# Patient Record
Sex: Female | Born: 1960
Health system: Southern US, Community
[De-identification: ages and names within clinical notes are randomized; demographics above are authoritative.]

## PROBLEM LIST (undated history)

## (undated) DIAGNOSIS — R112 Nausea with vomiting, unspecified: Secondary | ICD-10-CM

## (undated) DIAGNOSIS — Z9889 Other specified postprocedural states: Secondary | ICD-10-CM

## (undated) DIAGNOSIS — T7840XA Allergy, unspecified, initial encounter: Secondary | ICD-10-CM

## (undated) DIAGNOSIS — C50919 Malignant neoplasm of unspecified site of unspecified female breast: Secondary | ICD-10-CM

## (undated) DIAGNOSIS — K219 Gastro-esophageal reflux disease without esophagitis: Secondary | ICD-10-CM

## (undated) DIAGNOSIS — D242 Benign neoplasm of left breast: Secondary | ICD-10-CM

## (undated) DIAGNOSIS — D649 Anemia, unspecified: Secondary | ICD-10-CM

## (undated) DIAGNOSIS — Z923 Personal history of irradiation: Secondary | ICD-10-CM

## (undated) DIAGNOSIS — F419 Anxiety disorder, unspecified: Secondary | ICD-10-CM

## (undated) HISTORY — DX: Allergy, unspecified, initial encounter: T78.40XA

## (undated) HISTORY — PX: BREAST EXCISIONAL BIOPSY: SUR124

## (undated) HISTORY — PX: NASAL SEPTUM SURGERY: SHX37

## (undated) HISTORY — PX: KNEE ARTHROSCOPY: SHX127

---

## 1998-12-30 HISTORY — PX: OTHER SURGICAL HISTORY: SHX169

## 1999-06-18 ENCOUNTER — Other Ambulatory Visit: Admission: RE | Admit: 1999-06-18 | Discharge: 1999-06-18 | Payer: Self-pay | Admitting: Family Medicine

## 1999-12-31 HISTORY — PX: CHOLECYSTECTOMY: SHX55

## 2000-01-03 ENCOUNTER — Ambulatory Visit (HOSPITAL_COMMUNITY): Admission: RE | Admit: 2000-01-03 | Discharge: 2000-01-03 | Payer: Self-pay | Admitting: Orthopedic Surgery

## 2000-04-20 ENCOUNTER — Emergency Department (HOSPITAL_COMMUNITY): Admission: EM | Admit: 2000-04-20 | Discharge: 2000-04-20 | Payer: Self-pay

## 2000-06-18 ENCOUNTER — Other Ambulatory Visit: Admission: RE | Admit: 2000-06-18 | Discharge: 2000-06-18 | Payer: Self-pay | Admitting: Gynecology

## 2000-07-09 ENCOUNTER — Emergency Department (HOSPITAL_COMMUNITY): Admission: EM | Admit: 2000-07-09 | Discharge: 2000-07-10 | Payer: Self-pay

## 2000-07-10 ENCOUNTER — Encounter: Payer: Self-pay | Admitting: Emergency Medicine

## 2000-07-17 ENCOUNTER — Observation Stay (HOSPITAL_COMMUNITY): Admission: RE | Admit: 2000-07-17 | Discharge: 2000-07-18 | Payer: Self-pay | Admitting: General Surgery

## 2000-07-17 ENCOUNTER — Encounter: Payer: Self-pay | Admitting: General Surgery

## 2000-07-17 ENCOUNTER — Encounter (INDEPENDENT_AMBULATORY_CARE_PROVIDER_SITE_OTHER): Payer: Self-pay

## 2000-08-27 ENCOUNTER — Encounter: Payer: Self-pay | Admitting: Gynecology

## 2000-08-27 ENCOUNTER — Ambulatory Visit (HOSPITAL_COMMUNITY): Admission: RE | Admit: 2000-08-27 | Discharge: 2000-08-27 | Payer: Self-pay | Admitting: Gynecology

## 2000-12-11 ENCOUNTER — Encounter (INDEPENDENT_AMBULATORY_CARE_PROVIDER_SITE_OTHER): Payer: Self-pay | Admitting: Specialist

## 2000-12-11 ENCOUNTER — Other Ambulatory Visit: Admission: RE | Admit: 2000-12-11 | Discharge: 2000-12-11 | Payer: Self-pay | Admitting: *Deleted

## 2001-07-15 ENCOUNTER — Other Ambulatory Visit: Admission: RE | Admit: 2001-07-15 | Discharge: 2001-07-15 | Payer: Self-pay | Admitting: Gynecology

## 2002-07-20 ENCOUNTER — Other Ambulatory Visit: Admission: RE | Admit: 2002-07-20 | Discharge: 2002-07-20 | Payer: Self-pay | Admitting: Gynecology

## 2002-09-14 ENCOUNTER — Ambulatory Visit (HOSPITAL_COMMUNITY): Admission: RE | Admit: 2002-09-14 | Discharge: 2002-09-14 | Payer: Self-pay | Admitting: Gynecology

## 2002-09-14 ENCOUNTER — Encounter: Payer: Self-pay | Admitting: Gynecology

## 2003-07-18 ENCOUNTER — Other Ambulatory Visit: Admission: RE | Admit: 2003-07-18 | Discharge: 2003-07-18 | Payer: Self-pay | Admitting: Gynecology

## 2004-06-01 ENCOUNTER — Encounter: Admission: RE | Admit: 2004-06-01 | Discharge: 2004-06-01 | Payer: Self-pay | Admitting: Gynecology

## 2004-06-18 ENCOUNTER — Ambulatory Visit (HOSPITAL_COMMUNITY): Admission: RE | Admit: 2004-06-18 | Discharge: 2004-06-18 | Payer: Self-pay | Admitting: Family Medicine

## 2004-06-27 ENCOUNTER — Encounter: Admission: RE | Admit: 2004-06-27 | Discharge: 2004-06-27 | Payer: Self-pay | Admitting: Gynecology

## 2004-08-20 ENCOUNTER — Other Ambulatory Visit: Admission: RE | Admit: 2004-08-20 | Discharge: 2004-08-20 | Payer: Self-pay | Admitting: Gynecology

## 2005-07-26 ENCOUNTER — Encounter: Admission: RE | Admit: 2005-07-26 | Discharge: 2005-07-26 | Payer: Self-pay | Admitting: Gynecology

## 2005-09-03 ENCOUNTER — Other Ambulatory Visit: Admission: RE | Admit: 2005-09-03 | Discharge: 2005-09-03 | Payer: Self-pay | Admitting: Gynecology

## 2005-09-06 ENCOUNTER — Ambulatory Visit (HOSPITAL_COMMUNITY): Admission: RE | Admit: 2005-09-06 | Discharge: 2005-09-06 | Payer: Self-pay | Admitting: Gynecology

## 2006-02-21 ENCOUNTER — Encounter: Admission: RE | Admit: 2006-02-21 | Discharge: 2006-02-21 | Payer: Self-pay | Admitting: Family Medicine

## 2006-09-29 ENCOUNTER — Encounter: Admission: RE | Admit: 2006-09-29 | Discharge: 2006-09-29 | Payer: Self-pay | Admitting: Gynecology

## 2006-10-08 ENCOUNTER — Other Ambulatory Visit: Admission: RE | Admit: 2006-10-08 | Discharge: 2006-10-08 | Payer: Self-pay | Admitting: Gynecology

## 2007-02-09 ENCOUNTER — Encounter: Admission: RE | Admit: 2007-02-09 | Discharge: 2007-02-09 | Payer: Self-pay | Admitting: Family Medicine

## 2007-10-27 ENCOUNTER — Other Ambulatory Visit: Admission: RE | Admit: 2007-10-27 | Discharge: 2007-10-27 | Payer: Self-pay | Admitting: Gynecology

## 2007-12-16 ENCOUNTER — Ambulatory Visit (HOSPITAL_COMMUNITY): Admission: RE | Admit: 2007-12-16 | Discharge: 2007-12-16 | Payer: Self-pay | Admitting: Gynecology

## 2008-05-13 ENCOUNTER — Encounter: Admission: RE | Admit: 2008-05-13 | Discharge: 2008-05-13 | Payer: Self-pay | Admitting: Family Medicine

## 2008-10-12 ENCOUNTER — Other Ambulatory Visit: Admission: RE | Admit: 2008-10-12 | Discharge: 2008-10-12 | Payer: Self-pay | Admitting: Gynecology

## 2008-10-14 ENCOUNTER — Encounter: Admission: RE | Admit: 2008-10-14 | Discharge: 2008-10-14 | Payer: Self-pay | Admitting: Gynecology

## 2008-12-30 HISTORY — PX: BREAST SURGERY: SHX581

## 2009-08-15 ENCOUNTER — Encounter: Admission: RE | Admit: 2009-08-15 | Discharge: 2009-08-15 | Payer: Self-pay | Admitting: Gynecology

## 2010-02-19 ENCOUNTER — Encounter: Admission: RE | Admit: 2010-02-19 | Discharge: 2010-02-19 | Payer: Self-pay | Admitting: General Surgery

## 2010-08-24 ENCOUNTER — Encounter: Admission: RE | Admit: 2010-08-24 | Discharge: 2010-08-24 | Payer: Self-pay | Admitting: Gynecology

## 2010-11-10 ENCOUNTER — Encounter: Admission: RE | Admit: 2010-11-10 | Discharge: 2010-11-10 | Payer: Self-pay | Admitting: Gynecology

## 2011-05-08 ENCOUNTER — Encounter (INDEPENDENT_AMBULATORY_CARE_PROVIDER_SITE_OTHER): Payer: Self-pay | Admitting: General Surgery

## 2011-05-17 NOTE — Op Note (Signed)
[place]  Patient:    Cynthia Ford, Cynthia Ford                      MRN: [id number] Proc. Date: 07/17/00 Adm. Date:  [id number] Disc. Date: [id number] Attending:  Henrene Dodge                           Operative Report  PREOPERATIVE DIAGNOSIS:  Chronic cholecystitis.  POSTOPERATIVE DIAGNOSIS:  Chronic cholecystitis.  OPERATION:  Laparoscopic cholecystectomy with cholangiogram.  SURGEON:  Anselm Pancoast. Zachery Dakins, M.D.  ASSISTANT:  Currie Paris, M.D.  INDICATIONS:  Cynthia Ford is a 50 year old Caucasian female who presented to the emergency room here on July 09, 2000, with severe recurrent epigastric pain and this pain had started approximately at 11:00 p.m. the previous evening.  Her gallbladder showed sludge and her liver enzymes were abnormal with a bilirubin of 1.9, an SGOT of 287.  The patients pain started subsiding and she elected to basically schedule now for surgery one week later because of work commitments.  During this past week she has not had significant episodes of pain, but she has been on a low fat diet and cautious with her eating.  I did not repeat her liver function studies preoperatively.  PROCEDURE IN DETAIL:  The patient was taken to the operative suite.  She received 3 grams of Unasyn and SPA stockings.  The abdomen was prepped with Betadine and draped in a sterile manner.  A small incision was made below the umbilicus, fascia identified, picked up between two hemostats, a small opening made, the underlying peritoneum identified, this picked up, and a small opening made through this.  A traction suture was then placed holding up the abdominal wall, and then the Hasson cannula introduced.  The gallbladder was quite distended but not acutely inflamed.  The upper 10 mm trocar was placed under the midline after anesthetizing the fascia, and then the two lateral 5 mm trocars were placed.  The gallbladder was  grasped, retracted upward and outward.  There was a lot of thickening and scarring around the cystic duct, gallbladder junction, and we were not sure whether we could definitely see the common bile duct or the cystic duct the way it was lying.  We carefully teased off the cystic duct real flush with the gallbladder, clipped it, and then placed a catheter within the cystic duct and obtained an X-ray.  This shows a long dilated cystic duct, kind of tortuous, really entering from the left side.  What we were visualizing was the long cystic duct.  Then, there is kind of prompt filling into the duodenum.  When you put her head down, it re-flushes back up into the intrahepatic radicles where you can see both the left and right.  No evidence of any definite stones was seen.  There was good prompt filling. We then removed the catheter, triply clipped the cystic duct after taking out probably about another inch longer of it, and then identified the anterior-posterior branch of the cystic arteries, which were doubly clamped proximally and singly distally, and then the gallbladder freed from its bed with the hook electrocautery.  Good hemostasis was obtained.  The gallbladder was then brought up through the fascia of the umbilicus and then the fascia was closed with two figure-of-eight Vicryl.  The fascia was anesthetized at the umbilicus.  The two lateral 5 mm trocars were withdrawn  after removing the irrigating fluid, the carbon dioxide released; and then the upper 10 mm trocar withdrawn.  The subcutaneous wounds were closed with 4-0 Monocryl and then Benzoin and Steri-Strips on the skin.  The patient tolerated the procedure nicely and was extubated, taken to the recovery room in stable postop condition. DD:  07/17/00 TD:  07/19/00 Job: [id number]

## 2012-03-09 ENCOUNTER — Encounter (INDEPENDENT_AMBULATORY_CARE_PROVIDER_SITE_OTHER): Payer: Self-pay | Admitting: General Surgery

## 2013-05-22 ENCOUNTER — Emergency Department (HOSPITAL_BASED_OUTPATIENT_CLINIC_OR_DEPARTMENT_OTHER): Payer: BC Managed Care – PPO

## 2013-05-22 ENCOUNTER — Encounter (HOSPITAL_BASED_OUTPATIENT_CLINIC_OR_DEPARTMENT_OTHER): Payer: Self-pay | Admitting: *Deleted

## 2013-05-22 ENCOUNTER — Emergency Department (HOSPITAL_BASED_OUTPATIENT_CLINIC_OR_DEPARTMENT_OTHER)
Admission: EM | Admit: 2013-05-22 | Discharge: 2013-05-22 | Disposition: A | Discharge status: Home / Self Care | Payer: BC Managed Care – PPO | Attending: Emergency Medicine | Admitting: Emergency Medicine

## 2013-05-22 DIAGNOSIS — R079 Chest pain, unspecified: Secondary | ICD-10-CM

## 2013-05-22 DIAGNOSIS — R071 Chest pain on breathing: Secondary | ICD-10-CM | POA: Insufficient documentation

## 2013-05-22 DIAGNOSIS — K219 Gastro-esophageal reflux disease without esophagitis: Secondary | ICD-10-CM | POA: Insufficient documentation

## 2013-05-22 DIAGNOSIS — Z79899 Other long term (current) drug therapy: Secondary | ICD-10-CM | POA: Insufficient documentation

## 2013-05-22 DIAGNOSIS — J45909 Unspecified asthma, uncomplicated: Secondary | ICD-10-CM | POA: Insufficient documentation

## 2013-05-22 DIAGNOSIS — K922 Gastrointestinal hemorrhage, unspecified: Secondary | ICD-10-CM | POA: Insufficient documentation

## 2013-05-22 DIAGNOSIS — R42 Dizziness and giddiness: Secondary | ICD-10-CM | POA: Insufficient documentation

## 2013-05-22 LAB — BASIC METABOLIC PANEL
BUN: 14 mg/dL (ref 6–23)
CO2: 29 mEq/L (ref 19–32)
Calcium: 9.4 mg/dL (ref 8.4–10.5)
Chloride: 101 mEq/L (ref 96–112)
Creatinine, Ser: 1 mg/dL (ref 0.50–1.10)
GFR calc Af Amer: 74 mL/min — ABNORMAL LOW (ref >90)
GFR calc non Af Amer: 64 mL/min — ABNORMAL LOW (ref >90)
Glucose, Bld: 91 mg/dL (ref 70–99)
Potassium: 3.9 mEq/L (ref 3.5–5.1)
Sodium: 138 mEq/L (ref 135–145)

## 2013-05-22 LAB — TROPONIN I: Troponin I: <0.3 ng/mL (ref <0.30)

## 2013-05-22 LAB — D-DIMER, QUANTITATIVE: D-Dimer, Quant: <0.27 ug/mL-FEU (ref 0.00–0.48)

## 2013-05-22 LAB — CBC WITH DIFFERENTIAL/PLATELET
Basophils Absolute: 0 10*3/uL (ref 0.0–0.1)
Basophils Relative: 0 % (ref 0–1)
Eosinophils Absolute: 0.6 10*3/uL (ref 0.0–0.7)
Eosinophils Relative: 5 % (ref 0–5)
HCT: 31.4 % — ABNORMAL LOW (ref 36.0–46.0)
Hemoglobin: 11.3 g/dL — ABNORMAL LOW (ref 12.0–15.0)
Lymphocytes Relative: 32 % (ref 12–46)
Lymphs Abs: 3.7 10*3/uL (ref 0.7–4.0)
MCH: 27.8 pg (ref 26.0–34.0)
MCHC: 36 g/dL (ref 30.0–36.0)
MCV: 77.3 fL — ABNORMAL LOW (ref 78.0–100.0)
Monocytes Absolute: 0.9 10*3/uL (ref 0.1–1.0)
Monocytes Relative: 8 % (ref 3–12)
Neutro Abs: 6.3 10*3/uL (ref 1.7–7.7)
Neutrophils Relative %: 55 % (ref 43–77)
Platelets: 315 10*3/uL (ref 150–400)
RBC: 4.06 MIL/uL (ref 3.87–5.11)
RDW: 13.4 % (ref 11.5–15.5)
WBC: 11.5 10*3/uL — ABNORMAL HIGH (ref 4.0–10.5)

## 2013-05-22 LAB — OCCULT BLOOD X 1 CARD TO LAB, STOOL: Fecal Occult Bld: POSITIVE — AB

## 2013-05-22 MED ORDER — OMEPRAZOLE 20 MG PO CPDR: 40.0000 mg | DELAYED_RELEASE_CAPSULE | Freq: Every day | ORAL | Status: DC

## 2013-05-22 NOTE — ED Provider Notes (Signed)
History    This chart was scribed for Cynthia Horn, MD, by Frederik Pear, ED scribe. The patient was seen in room MH12/MH12 and the patient's care was started at 1518.    CSN: [id number]  Arrival date & time 05/22/13  1410   None     Chief Complaint  Patient presents with  . Chest Pain    (Consider location/radiation/quality/duration/timing/severity/associated sxs/prior treatment) The history is provided by the patient and medical records. No language interpreter was used.    HPI Comments: Cynthia Ford is a 52 y.o. female with a h/o of asthma who presents to the Emergency Department complaining of sudden onset, constant, pressure-like CP that radiates to the back and down left arm that lasts for hours at a time that is not improved or alleviated by anything and has been present almost all day and all night for the last two weeks. She also complains of intermittent lightheadedness that lasts for approximately 30 seconds at a time and without alleviation or aggravation by anything and burgundy-colored stools that began suddenly earlier in the week. Her last BM was 2 days ago. She denies a h/o of previously discolored stools and at baseline, her stools are sold and brown. She was seen by [facility] 6 days ago and diagnosed with GERD and wheezing, which she had not noticed before the visit, and was diagnosed with a course of Prednisone, which she finished yesterday, and Prilosec. She reports that the CP was mildly relieved throughout the week after beginning the Prednisone and Prilosec, but has worsened since completing the Prednisone yesterday. She denies abdominal pain, nausea, emesis, diarrhea, HA, weakness, wheezing, cough, SOB, hallucinations, as well as any other symptoms. She has an albuterol rescue inhaler at home that she does not use at baseline, but has used twice within the last week. She currently takes Dostinex for a pituitary gland condition that resulted increased prolactin.  She denies taking an iron supplement.   Past Medical History  Diagnosis Date  . Asthma     Past Surgical History  Procedure Laterality Date  . Cholecystectomy  2001  . Breast surgery  2010    LEFT  . Nasal passage  2000  . Knee arthroscopy  1999/1993    History reviewed. No pertinent family history.  History  Substance Use Topics  . Smoking status: Never Smoker   . Smokeless tobacco: Not on file  . Alcohol Use: No    OB History   Grav Para Term Preterm Abortions TAB SAB Ect Mult Living                  Review of Systems A complete 10 system review of systems was obtained and all systems are negative except as noted in the HPI and PMH.  Allergies  Review of patient's allergies indicates no known allergies.  Home Medications   Current Outpatient Rx  Name  Route  Sig  Dispense  Refill  . cabergoline (DOSTINEX) 0.5 MG tablet   Oral   Take 0.25 mg by mouth 2 (two) times a week.           . cetirizine (ZYRTEC) 10 MG tablet   Oral   Take 10 mg by mouth daily.           . fluticasone (FLOVENT DISKUS) 50 MCG/BLIST diskus inhaler   Inhalation   Inhale 1 puff into the lungs 2 (two) times daily.           . fluticasone (  FLOVENT HFA) 110 MCG/ACT inhaler   Inhalation   Inhale 1 puff into the lungs 2 (two) times daily.           . IRON PO   Oral   Take 65 mg by mouth daily.           Marland Kitchen omeprazole (PRILOSEC) 20 MG capsule   Oral   Take 2 capsules (40 mg total) by mouth daily.   30 capsule   0     BP 121/74  Pulse 60  Resp 14  Ht 5\' 4"  (1.626 m)  Wt 150 lb (68.04 kg)  BMI 25.73 kg/m2  SpO2 100%  Physical Exam  Nursing note and vitals reviewed. Constitutional:  Awake, alert, nontoxic appearance.  HENT:  Head: Atraumatic.  Eyes: Right eye exhibits no discharge. Left eye exhibits no discharge.  Neck: Neck supple.  Cardiovascular: Normal rate, regular rhythm and normal heart sounds.  Exam reveals no gallop and no friction rub.   No murmur  heard. Pulmonary/Chest: Effort normal and breath sounds normal. No respiratory distress. She has no wheezes. She has no rales. She exhibits tenderness.  Clear breath sounds  Mildly reproducible chest wall pain to the parasternal border.  Abdominal: Soft. Bowel sounds are normal. She exhibits no distension and no mass. There is no tenderness. There is no rebound and no guarding.  Genitourinary:  Female chaperone present during rectal exam. Dark brown well-formed stool. No gross blood. Non-tender.  Musculoskeletal: She exhibits no edema and no tenderness.  Baseline ROM, no obvious new focal weakness. No extremity tenderness.  Neurological:  Mental status and motor strength appears baseline for patient and situation.  Skin: No rash noted.  Psychiatric: She has a normal mood and affect.    ED Course  Procedures (including critical care time)  ECG: Sinus rhythm, ventricular rate 65, normal axis, normal intervals, nonspecific T wave abnormality, no significant change noted compared with July 2001  DIAGNOSTIC STUDIES: Oxygen Saturation is 100% on room air, normal by my interpretation.    COORDINATION OF CARE:  15:26- Patient and family understand and agree with initial ED impression and plan, which includes a chest X-ray, D-dime, basic metabolic panel, CBC with differential, and Troponin,  with expectations set for ED visit.  16:47- No abdominal pain. No emesis. With brown stool today that is heme positive, but recent reports of maroon stools with slight anemia at 11.5, outpatient follow up seems logical. Transient source is mostly a lower GI bleed since pt complains of maroon stool. Will double up on Prilosec until a follow up visit with PCP or GI.  Results for orders placed during the hospital encounter of 05/22/13  TROPONIN I      Result Value Range   Troponin I <0.30  <0.30 ng/mL  CBC WITH DIFFERENTIAL      Result Value Range   WBC 11.5 (*) 4.0 - 10.5 K/uL   RBC 4.06  3.87 - 5.11  MIL/uL   Hemoglobin 11.3 (*) 12.0 - 15.0 g/dL   HCT 96.0 (*) 45.4 - 09.8 %   MCV 77.3 (*) 78.0 - 100.0 fL   MCH 27.8  26.0 - 34.0 pg   MCHC 36.0  30.0 - 36.0 g/dL   RDW 11.9  14.7 - 82.9 %   Platelets 315  150 - 400 K/uL   Neutrophils Relative % 55  43 - 77 %   Neutro Abs 6.3  1.7 - 7.7 K/uL   Lymphocytes Relative 32  12 - 46 %  Lymphs Abs 3.7  0.7 - 4.0 K/uL   Monocytes Relative 8  3 - 12 %   Monocytes Absolute 0.9  0.1 - 1.0 K/uL   Eosinophils Relative 5  0 - 5 %   Eosinophils Absolute 0.6  0.0 - 0.7 K/uL   Basophils Relative 0  0 - 1 %   Basophils Absolute 0.0  0.0 - 0.1 K/uL  BASIC METABOLIC PANEL      Result Value Range   Sodium 138  135 - 145 mEq/L   Potassium 3.9  3.5 - 5.1 mEq/L   Chloride 101  96 - 112 mEq/L   CO2 29  19 - 32 mEq/L   Glucose, Bld 91  70 - 99 mg/dL   BUN 14  6 - 23 mg/dL   Creatinine, Ser 4.09  0.50 - 1.10 mg/dL   Calcium 9.4  8.4 - 81.1 mg/dL   GFR calc non Af Amer 64 (*) >90 mL/min   GFR calc Af Amer 74 (*) >90 mL/min  D-DIMER, QUANTITATIVE      Result Value Range   D-Dimer, Quant <0.27  0.00 - 0.48 ug/mL-FEU  OCCULT BLOOD X 1 CARD TO LAB, STOOL      Result Value Range   Fecal Occult Bld POSITIVE (*) NEGATIVE   Labs Reviewed  CBC WITH DIFFERENTIAL - Abnormal; Notable for the following:    WBC 11.5 (*)    Hemoglobin 11.3 (*)    HCT 31.4 (*)    MCV 77.3 (*)    All other components within normal limits  BASIC METABOLIC PANEL - Abnormal; Notable for the following:    GFR calc non Af Amer 64 (*)    GFR calc Af Amer 74 (*)    All other components within normal limits  OCCULT BLOOD X 1 CARD TO LAB, STOOL - Abnormal; Notable for the following:    Fecal Occult Bld POSITIVE (*)    All other components within normal limits  TROPONIN I  D-DIMER, QUANTITATIVE   Dg Chest 2 View  05/22/2013   *RADIOLOGY REPORT*  Clinical Data: Chest pain.  CHEST - 2 VIEW  Comparison: 05/13/2008.  Findings: The cardiac silhouette, mediastinal and hilar contours are  within normal limits and stable. The lungs are clear.  No pleural effusions.  The bony thorax is intact.  IMPRESSION: Normal chest x-ray.   Original Report Authenticated By: Rudie Meyer, M.D.     1. Chest pain   2. GI bleed       MDM  I personally performed the services described in this documentation, which was scribed in my presence. The recorded information has been reviewed and is accurate. I doubt any other EMC precluding discharge at this time including, but not necessarily limited to the following:ACS, PE, active GI bleed requiring admit.         Cynthia Horn, MD 05/23/13 1452

## 2013-05-22 NOTE — ED Notes (Addendum)
Pt states she woke up thjis a.m. And shortly after, began having chest pressure. Radiates to back at times. Some weakness left arm. Was seen last weekend for same "also had wheezing". EKG done. "Lower part of heart not pumping well." Given meds and told to go to ED if s/s returned. Taken to [facility]. EKG being done.

## 2014-03-25 ENCOUNTER — Other Ambulatory Visit: Payer: Self-pay | Admitting: Otolaryngology

## 2014-03-25 DIAGNOSIS — K115 Sialolithiasis: Secondary | ICD-10-CM

## 2014-03-30 ENCOUNTER — Ambulatory Visit
Admission: RE | Admit: 2014-03-30 | Discharge: 2014-03-30 | Disposition: A | Discharge status: Home / Self Care | Payer: BC Managed Care – PPO | Source: Ambulatory Visit | Attending: Otolaryngology | Admitting: Otolaryngology

## 2014-03-30 DIAGNOSIS — K115 Sialolithiasis: Secondary | ICD-10-CM

## 2014-03-30 MED ORDER — IOHEXOL 300 MG/ML  SOLN
75.0000 mL | Freq: Once | INTRAMUSCULAR | Status: AC | PRN
  Administered 2014-03-30: 75 mL via INTRAVENOUS

## 2014-04-11 ENCOUNTER — Other Ambulatory Visit: Payer: Self-pay | Admitting: Otolaryngology

## 2014-04-11 DIAGNOSIS — R9389 Abnormal findings on diagnostic imaging of other specified body structures: Secondary | ICD-10-CM

## 2014-04-11 DIAGNOSIS — E042 Nontoxic multinodular goiter: Secondary | ICD-10-CM

## 2014-04-20 ENCOUNTER — Ambulatory Visit
Admission: RE | Admit: 2014-04-20 | Discharge: 2014-04-20 | Disposition: A | Discharge status: Home / Self Care | Payer: BC Managed Care – PPO | Source: Ambulatory Visit | Attending: Otolaryngology | Admitting: Otolaryngology

## 2014-04-20 ENCOUNTER — Other Ambulatory Visit (HOSPITAL_COMMUNITY)
Admission: RE | Admit: 2014-04-20 | Discharge: 2014-04-20 | Disposition: A | Discharge status: Home / Self Care | Payer: BC Managed Care – PPO | Source: Ambulatory Visit | Attending: Interventional Radiology | Admitting: Interventional Radiology

## 2014-04-20 DIAGNOSIS — R9389 Abnormal findings on diagnostic imaging of other specified body structures: Secondary | ICD-10-CM

## 2014-04-20 DIAGNOSIS — E042 Nontoxic multinodular goiter: Secondary | ICD-10-CM

## 2014-04-20 DIAGNOSIS — E041 Nontoxic single thyroid nodule: Secondary | ICD-10-CM | POA: Insufficient documentation

## 2014-12-12 ENCOUNTER — Other Ambulatory Visit: Payer: Self-pay | Admitting: Gynecology

## 2014-12-14 LAB — CYTOLOGY - PAP

## 2014-12-15 ENCOUNTER — Other Ambulatory Visit: Payer: Self-pay | Admitting: Gynecology

## 2014-12-15 DIAGNOSIS — R928 Other abnormal and inconclusive findings on diagnostic imaging of breast: Secondary | ICD-10-CM

## 2014-12-19 ENCOUNTER — Ambulatory Visit
Admission: RE | Admit: 2014-12-19 | Discharge: 2014-12-19 | Disposition: A | Discharge status: Home / Self Care | Payer: BC Managed Care – PPO | Source: Ambulatory Visit | Attending: Gynecology | Admitting: Gynecology

## 2014-12-19 ENCOUNTER — Other Ambulatory Visit: Payer: Self-pay | Admitting: Gynecology

## 2014-12-19 DIAGNOSIS — N6320 Unspecified lump in the left breast, unspecified quadrant: Secondary | ICD-10-CM

## 2014-12-19 DIAGNOSIS — R928 Other abnormal and inconclusive findings on diagnostic imaging of breast: Secondary | ICD-10-CM

## 2014-12-30 DIAGNOSIS — C50919 Malignant neoplasm of unspecified site of unspecified female breast: Secondary | ICD-10-CM

## 2014-12-30 HISTORY — PX: BREAST LUMPECTOMY: SHX2

## 2014-12-30 HISTORY — DX: Malignant neoplasm of unspecified site of unspecified female breast: C50.919

## 2015-01-09 ENCOUNTER — Ambulatory Visit (INDEPENDENT_AMBULATORY_CARE_PROVIDER_SITE_OTHER): Payer: Self-pay | Admitting: Surgery

## 2015-01-09 DIAGNOSIS — D242 Benign neoplasm of left breast: Secondary | ICD-10-CM

## 2015-01-09 NOTE — H&P (Signed)
Cynthia Ford 01/09/2015 10:35 AM Location: [facility] Patient #: [id number] DOB: 1961-12-07 Single / Language: Cynthia Ford / Race: Black or African American Female History of Present Illness Cynthia Ford A. Cynthia Munns MD; 01/09/2015 11:03 AM) Patient words: eval left breast PAPILLOMA   Pt sent at the request of Dr [name] for left breast mass detected on mammogram. This was a screening mammogram. Pt denies any pain or mass in either breast but does have nipple discharge left breast clear. No history of breast cancer.    Diagnosis Breast, left, needle core biopsy, mass, 6 o'clock, slightly outer - DUCTAL PAPILLOMA. - SEE MICROSCOPIC DESCRIPTION.   CLINICAL DATA: Possible left breast mass at recent screening mammography.  EXAM: DIGITAL DIAGNOSTIC LEFT MAMMOGRAM  ULTRASOUND LEFT BREAST  COMPARISON: Previous examinations, including the screening mammogram dated 12/12/2014.  ACR Breast Density Category b: There are scattered areas of fibroglandular density.  FINDINGS: Spot compression views of the left breast confirm a small, oval, partially obscured mass in the posterior aspect of the 6 o'clock position of the breast. There is also a second mass more posteriorly located, partially included on the images, with similar mammographic features. The visualized margins of both masses are circumscribed. These are new since 12/09/2013.  On physical exam, no mass is palpable in the inferior left breast or left axilla.  Ultrasound is performed, showing a 7 x 6 x 5 mm oval, circumscribed, mixed echogenicity mass in the 6 o'clock position of the left breast, 2 cm from the nipple. This is horizontally oriented and has no internal blood flow with color Doppler.  There is a 6 x 5 x 4 mm oval, mildly lobulated hypoechoic mass in the 6 o'clock position of the left breast, 5 cm from the nipple. This is vertically oriented in the radial plane.  Ultrasound of the left axilla  demonstrated normal appearing left axillary lymph nodes.  IMPRESSION: Two new, solid masses in the 6 o'clock position of the left breast, as described above.  RECOMMENDATION: Ultrasound-guided core needle biopsy of both left breast masses. This is scheduled follow.  I have discussed the findings and recommendations with the patient. Results were also provided in writing at the conclusion of the visit. If applicable, a reminder letter will be sent to the patient regarding the next appointment.  BI-RADS CATEGORY 4: Suspicious.   Electronically Signed By: Enrique Sack M.D. On: 12/19/2014 12:12.  The patient is a 54 year old female   Other Problems Cynthia Ford, [place]; 01/09/2015 10:35 AM) Anxiety Disorder Asthma Back Pain Cholelithiasis Gastroesophageal Reflux Disease Lump In Breast Thyroid Disease  Past Surgical History Cynthia Ford, CMA; 01/09/2015 10:35 AM) Breast Biopsy Left. multiple Gallbladder Surgery - Laparoscopic Knee Surgery Left.  Diagnostic Studies History Cynthia Ford, CMA; 01/09/2015 10:35 AM) Colonoscopy 5-10 years ago Mammogram within last year Pap Smear 1-5 years ago  Allergies Cynthia Ford, CMA; 01/09/2015 10:38 AM) No Known Drug Allergies 01/09/2015  Medication History (Sonya Ford, CMA; 01/09/2015 10:38 AM) Cristy Friedlander HFA (110MCG/ACT Aerosol, Inhalation) Active. Fluticasone Propionate (50MCG/ACT Suspension, Nasal as needed) Active. ZyrTEC Allergy (10MG  Tablet, Oral) Active.  Social History Cynthia Ford, CMA; 01/09/2015 10:35 AM) Alcohol use Occasional alcohol use. Caffeine use Carbonated beverages, Coffee, Tea. No drug use Tobacco use Never smoker.  Family History Cynthia Ford, [place]; 01/09/2015 10:35 AM) Alcohol Abuse Father, Sister. Arthritis Mother. Colon Polyps Mother. Diabetes Mellitus Sister. Ischemic Bowel Disease Mother. Prostate Cancer Brother. Respiratory Condition Mother.  Pregnancy / Birth History Cynthia Ford, [place]; 01/09/2015 10:35 AM) Age  at menarche 10 years. Age of menopause 40-55 Gravida 0 Irregular periods Para 0     Review of Systems Cynthia Ford CMA; 01/09/2015 10:35 AM) General Not Present- Appetite Loss, Chills, Fatigue, Fever, Night Sweats, Weight Gain and Weight Loss. Skin Not Present- Change in Wart/Mole, Dryness, Hives, Jaundice, New Lesions, Non-Healing Wounds, Rash and Ulcer. HEENT Present- Hearing Loss, Seasonal Allergies, Sinus Pain and Visual Disturbances. Not Present- Earache, Hoarseness, Nose Bleed, Oral Ulcers, Ringing in the Ears, Sore Throat, Wears glasses/contact lenses and Yellow Eyes. Respiratory Not Present- Bloody sputum, Chronic Cough, Difficulty Breathing, Snoring and Wheezing. Breast Present- Breast Mass and Nipple Discharge. Not Present- Breast Pain and Skin Changes. Cardiovascular Not Present- Chest Pain, Difficulty Breathing Lying Down, Leg Cramps, Palpitations, Rapid Heart Rate, Shortness of Breath and Swelling of Extremities. Gastrointestinal Present- Nausea. Not Present- Abdominal Pain, Bloating, Bloody Stool, Change in Bowel Habits, Chronic diarrhea, Constipation, Difficulty Swallowing, Excessive gas, Gets full quickly at meals, Hemorrhoids, Indigestion, Rectal Pain and Vomiting. Female Genitourinary Not Present- Frequency, Nocturia, Painful Urination, Pelvic Pain and Urgency. Musculoskeletal Not Present- Back Pain, Joint Pain, Joint Stiffness, Muscle Pain, Muscle Weakness and Swelling of Extremities. Neurological Not Present- Decreased Memory, Fainting, Headaches, Numbness, Seizures, Tingling, Tremor, Trouble walking and Weakness. Psychiatric Not Present- Anxiety, Bipolar, Change in Sleep Pattern, Depression, Fearful and Frequent crying. Endocrine Present- Hot flashes. Not Present- Cold Intolerance, Excessive Hunger, Hair Changes, Heat Intolerance and New Diabetes. Hematology Not Present- Easy Bruising, Excessive bleeding, Gland problems, HIV and  Persistent Infections.  Vitals (Sonya Ford CMA; 01/09/2015 10:37 AM) 01/09/2015 10:36 AM Weight: 158 lb Height: 64in Body Surface Area: 1.8 m Body Mass Index: 27.12 kg/m Temp.: 84F(Temporal)  Pulse: 76 (Regular)  BP: 128/80 (Sitting, Left Arm, Standard)     Physical Exam (Kruz Chiu A. Devan Babino MD; 01/09/2015 11:03 AM)  General Mental Status-Alert. General Appearance-Consistent with stated age. Hydration-Well hydrated. Voice-Normal.  Head and Neck Head-normocephalic, atraumatic with no lesions or palpable masses. Trachea-midline. Thyroid Gland Characteristics - normal size and consistency.  Eye Eyeball - Bilateral-Extraocular movements intact. Sclera/Conjunctiva - Bilateral-No scleral icterus.  Chest and Lung Exam Chest and lung exam reveals -quiet, even and easy respiratory effort with no use of accessory muscles and on auscultation, normal breath sounds, no adventitious sounds and normal vocal resonance. Inspection Chest Wall - Normal. Back - normal.  Breast Breast - Left-Symmetric, Non Tender, No Biopsy scars, no Dimpling, No Inflammation, No Lumpectomy scars, No Mastectomy scars, No Peau d' Orange. Breast - Right-Symmetric, Non Tender, No Biopsy scars, no Dimpling, No Inflammation, No Lumpectomy scars, No Mastectomy scars, No Peau d' Orange. Breast Lump-No Palpable Breast Mass.  Cardiovascular Cardiovascular examination reveals -normal heart sounds, regular rate and rhythm with no murmurs and normal pedal pulses bilaterally.  Abdomen Inspection Inspection of the abdomen reveals - No Hernias. Skin - Scar - no surgical scars. Palpation/Percussion Palpation and Percussion of the abdomen reveal - Soft, Non Tender, No Rebound tenderness, No Rigidity (guarding) and No hepatosplenomegaly. Auscultation Auscultation of the abdomen reveals - Bowel sounds normal.  Neurologic Neurologic evaluation reveals -alert and oriented x 3 with no  impairment of recent or remote memory. Mental Status-Normal.  Musculoskeletal Normal Exam - Left-Upper Extremity Strength Normal and Lower Extremity Strength Normal. Normal Exam - Right-Upper Extremity Strength Normal and Lower Extremity Strength Normal.  Lymphatic Head & Neck  General Head & Neck Lymphatics: Bilateral - Description - Normal. Axillary  General Axillary Region: Bilateral - Description - Normal. Tenderness - Non Tender. Femoral & Inguinal  Generalized  Femoral & Inguinal Lymphatics: Bilateral - Description - Normal. Tenderness - Non Tender.    Assessment & Plan (Gracelynne Benedict A. Kelcie Currie MD; 01/09/2015 11:01 AM)  PAPILLOMA OF BREAST, LEFT (217  D24.2) Impression: recommend left breast lumpectomy due to small but finite risk of breast cancer with papilloma on core biopsy. Risk of lumpectomy include bleeding, infection, seroma, more surgery, use of seed/wire, wound care, cosmetic deformity and the need for other treatments, death , blood clots, death. Pt agrees to proceed.  Current Plans Pt Education - [facility] Breast Biopsy Instructions (FLB): discussed with patient and provided information.

## 2015-01-16 ENCOUNTER — Other Ambulatory Visit: Payer: Self-pay | Admitting: Surgery

## 2015-01-16 DIAGNOSIS — D242 Benign neoplasm of left breast: Secondary | ICD-10-CM

## 2015-01-30 HISTORY — PX: BREAST LUMPECTOMY: SHX2

## 2015-02-08 ENCOUNTER — Encounter (HOSPITAL_BASED_OUTPATIENT_CLINIC_OR_DEPARTMENT_OTHER): Payer: Self-pay | Admitting: *Deleted

## 2015-02-13 ENCOUNTER — Inpatient Hospital Stay: Admission: RE | Admit: 2015-02-13 | Payer: Self-pay | Source: Ambulatory Visit

## 2015-02-14 ENCOUNTER — Encounter (HOSPITAL_BASED_OUTPATIENT_CLINIC_OR_DEPARTMENT_OTHER): Payer: Self-pay

## 2015-02-14 ENCOUNTER — Ambulatory Visit
Admission: RE | Admit: 2015-02-14 | Discharge: 2015-02-14 | Disposition: A | Discharge status: Home / Self Care | Payer: BLUE CROSS/BLUE SHIELD | Source: Ambulatory Visit | Attending: Surgery | Admitting: Surgery

## 2015-02-14 ENCOUNTER — Ambulatory Visit (HOSPITAL_BASED_OUTPATIENT_CLINIC_OR_DEPARTMENT_OTHER)
Admission: RE | Admit: 2015-02-14 | Discharge: 2015-02-14 | Disposition: A | Discharge status: Home / Self Care | Payer: BLUE CROSS/BLUE SHIELD | Source: Ambulatory Visit | Attending: Surgery | Admitting: Surgery

## 2015-02-14 ENCOUNTER — Encounter (HOSPITAL_BASED_OUTPATIENT_CLINIC_OR_DEPARTMENT_OTHER)
Admission: RE | Disposition: A | Discharge status: Home / Self Care | Payer: Self-pay | Source: Ambulatory Visit | Attending: Surgery

## 2015-02-14 ENCOUNTER — Ambulatory Visit (HOSPITAL_BASED_OUTPATIENT_CLINIC_OR_DEPARTMENT_OTHER): Payer: BLUE CROSS/BLUE SHIELD | Admitting: Anesthesiology

## 2015-02-14 DIAGNOSIS — D0512 Intraductal carcinoma in situ of left breast: Secondary | ICD-10-CM | POA: Diagnosis not present

## 2015-02-14 DIAGNOSIS — D242 Benign neoplasm of left breast: Secondary | ICD-10-CM | POA: Diagnosis present

## 2015-02-14 DIAGNOSIS — J45909 Unspecified asthma, uncomplicated: Secondary | ICD-10-CM | POA: Diagnosis not present

## 2015-02-14 DIAGNOSIS — K219 Gastro-esophageal reflux disease without esophagitis: Secondary | ICD-10-CM | POA: Insufficient documentation

## 2015-02-14 DIAGNOSIS — Z7951 Long term (current) use of inhaled steroids: Secondary | ICD-10-CM | POA: Diagnosis not present

## 2015-02-14 DIAGNOSIS — E079 Disorder of thyroid, unspecified: Secondary | ICD-10-CM | POA: Diagnosis not present

## 2015-02-14 DIAGNOSIS — Z9889 Other specified postprocedural states: Secondary | ICD-10-CM | POA: Diagnosis not present

## 2015-02-14 HISTORY — DX: Gastro-esophageal reflux disease without esophagitis: K21.9

## 2015-02-14 HISTORY — DX: Other specified postprocedural states: Z98.890

## 2015-02-14 HISTORY — DX: Benign neoplasm of left breast: D24.2

## 2015-02-14 HISTORY — DX: Other specified postprocedural states: R11.2

## 2015-02-14 HISTORY — DX: Anemia, unspecified: D64.9

## 2015-02-14 HISTORY — PX: BREAST LUMPECTOMY WITH RADIOACTIVE SEED LOCALIZATION: SHX6424

## 2015-02-14 HISTORY — DX: Anxiety disorder, unspecified: F41.9

## 2015-02-14 LAB — POCT HEMOGLOBIN-HEMACUE: Hemoglobin: 13.7 g/dL (ref 12.0–15.0)

## 2015-02-14 SURGERY — BREAST LUMPECTOMY WITH RADIOACTIVE SEED LOCALIZATION
Anesthesia: General | Laterality: Left

## 2015-02-14 MED ORDER — CEFAZOLIN SODIUM-DEXTROSE 2-3 GM-% IV SOLR
INTRAVENOUS | Status: AC
  Filled 2015-02-14: qty 50

## 2015-02-14 MED ORDER — ONDANSETRON 8 MG PO TBDP
ORAL_TABLET | ORAL | Status: AC
  Filled 2015-02-14: qty 1

## 2015-02-14 MED ORDER — OXYCODONE HCL 5 MG/5ML PO SOLN
5.0000 mg | Freq: Once | ORAL | Status: DC | PRN
Start: 2015-02-14 — End: 2015-02-14

## 2015-02-14 MED ORDER — ONDANSETRON HCL 4 MG/2ML IJ SOLN: 4.0000 mg | Freq: Once | INTRAMUSCULAR | Status: DC | PRN

## 2015-02-14 MED ORDER — LACTATED RINGERS IV SOLN
INTRAVENOUS | Status: DC
  Administered 2015-02-14 (×2): via INTRAVENOUS

## 2015-02-14 MED ORDER — PROPOFOL 10 MG/ML IV BOLUS
INTRAVENOUS | Status: AC
  Filled 2015-02-14: qty 20

## 2015-02-14 MED ORDER — LIDOCAINE HCL (CARDIAC) 20 MG/ML IV SOLN
INTRAVENOUS | Status: DC | PRN
  Administered 2015-02-14: 80 mg via INTRAVENOUS

## 2015-02-14 MED ORDER — HYDROMORPHONE HCL 1 MG/ML IJ SOLN
0.2500 mg | INTRAMUSCULAR | Status: DC | PRN
  Administered 2015-02-14 (×2): 0.5 mg via INTRAVENOUS

## 2015-02-14 MED ORDER — CEFAZOLIN SODIUM-DEXTROSE 2-3 GM-% IV SOLR
2.0000 g | INTRAVENOUS | Status: AC
  Administered 2015-02-14: 2 g via INTRAVENOUS

## 2015-02-14 MED ORDER — ONDANSETRON 8 MG PO TBDP
8.0000 mg | ORAL_TABLET | Freq: Once | ORAL | Status: AC
Start: 2015-02-14 — End: 2015-02-14
  Administered 2015-02-14: 8 mg via ORAL

## 2015-02-14 MED ORDER — DEXAMETHASONE SODIUM PHOSPHATE 4 MG/ML IJ SOLN
INTRAMUSCULAR | Status: DC | PRN
  Administered 2015-02-14: 10 mg via INTRAVENOUS

## 2015-02-14 MED ORDER — HYDROMORPHONE HCL 1 MG/ML IJ SOLN
INTRAMUSCULAR | Status: AC
  Filled 2015-02-14: qty 1

## 2015-02-14 MED ORDER — OXYCODONE HCL 5 MG PO TABS: 5.0000 mg | ORAL_TABLET | Freq: Once | ORAL | Status: DC | PRN

## 2015-02-14 MED ORDER — FENTANYL CITRATE 0.05 MG/ML IJ SOLN
INTRAMUSCULAR | Status: DC | PRN
  Administered 2015-02-14: 100 ug via INTRAVENOUS

## 2015-02-14 MED ORDER — BUPIVACAINE HCL (PF) 0.25 % IJ SOLN
INTRAMUSCULAR | Status: AC
  Filled 2015-02-14: qty 90

## 2015-02-14 MED ORDER — MIDAZOLAM HCL 5 MG/5ML IJ SOLN
INTRAMUSCULAR | Status: DC | PRN
  Administered 2015-02-14: 2 mg via INTRAVENOUS

## 2015-02-14 MED ORDER — CHLORHEXIDINE GLUCONATE 4 % EX LIQD: 1.0000 "application " | Freq: Once | CUTANEOUS | Status: DC

## 2015-02-14 MED ORDER — FENTANYL CITRATE 0.05 MG/ML IJ SOLN
INTRAMUSCULAR | Status: AC
  Filled 2015-02-14: qty 4

## 2015-02-14 MED ORDER — ONDANSETRON HCL 4 MG/2ML IJ SOLN
INTRAMUSCULAR | Status: DC | PRN
  Administered 2015-02-14: 4 mg via INTRAVENOUS

## 2015-02-14 MED ORDER — FENTANYL CITRATE 0.05 MG/ML IJ SOLN: 50.0000 ug | INTRAMUSCULAR | Status: DC | PRN

## 2015-02-14 MED ORDER — MIDAZOLAM HCL 2 MG/2ML IJ SOLN: 1.0000 mg | INTRAMUSCULAR | Status: DC | PRN

## 2015-02-14 MED ORDER — BUPIVACAINE-EPINEPHRINE (PF) 0.25% -1:200000 IJ SOLN
INTRAMUSCULAR | Status: DC | PRN
Start: 2015-02-14 — End: 2015-02-14
  Administered 2015-02-14: 10 mL

## 2015-02-14 MED ORDER — MIDAZOLAM HCL 2 MG/2ML IJ SOLN
INTRAMUSCULAR | Status: AC
  Filled 2015-02-14: qty 2

## 2015-02-14 MED ORDER — OXYCODONE-ACETAMINOPHEN 5-325 MG PO TABS: 1.0000 | ORAL_TABLET | ORAL | Status: DC | PRN

## 2015-02-14 MED ORDER — PROPOFOL 10 MG/ML IV BOLUS
INTRAVENOUS | Status: DC | PRN
  Administered 2015-02-14: 200 mg via INTRAVENOUS

## 2015-02-14 SURGICAL SUPPLY — 50 items
APPLIER CLIP 9.375 MED OPEN (MISCELLANEOUS)
BINDER BREAST LRG (GAUZE/BANDAGES/DRESSINGS) IMPLANT
BINDER BREAST MEDIUM (GAUZE/BANDAGES/DRESSINGS) IMPLANT
BINDER BREAST XLRG (GAUZE/BANDAGES/DRESSINGS) IMPLANT
BINDER BREAST XXLRG (GAUZE/BANDAGES/DRESSINGS) IMPLANT
BLADE SURG 15 STRL LF DISP TIS (BLADE) ×1 IMPLANT
BLADE SURG 15 STRL SS (BLADE) ×1
CANISTER SUC SOCK COL 7IN (MISCELLANEOUS) ×2 IMPLANT
CANISTER SUCT 1200ML W/VALVE (MISCELLANEOUS) IMPLANT
CHLORAPREP W/TINT 26ML (MISCELLANEOUS) ×2 IMPLANT
CLIP APPLIE 9.375 MED OPEN (MISCELLANEOUS) IMPLANT
CLIP TI WIDE RED SMALL 6 (CLIP) ×2 IMPLANT
COVER BACK TABLE 60X90IN (DRAPES) ×2 IMPLANT
COVER MAYO STAND STRL (DRAPES) ×2 IMPLANT
COVER PROBE W GEL 5X96 (DRAPES) ×2 IMPLANT
DECANTER SPIKE VIAL GLASS SM (MISCELLANEOUS) IMPLANT
DEVICE DUBIN W/COMP PLATE 8390 (MISCELLANEOUS) ×2 IMPLANT
DRAPE LAPAROSCOPIC ABDOMINAL (DRAPES) IMPLANT
DRAPE LAPAROTOMY 100X72 PEDS (DRAPES) ×2 IMPLANT
DRAPE UTILITY XL STRL (DRAPES) ×2 IMPLANT
ELECT COATED BLADE 2.86 ST (ELECTRODE) ×2 IMPLANT
ELECT REM PT RETURN 9FT ADLT (ELECTROSURGICAL) ×2
ELECTRODE REM PT RTRN 9FT ADLT (ELECTROSURGICAL) ×1 IMPLANT
GLOVE BIO SURGEON STRL SZ 6.5 (GLOVE) ×2 IMPLANT
GLOVE BIOGEL M 7.0 STRL (GLOVE) ×2 IMPLANT
GLOVE BIOGEL PI IND STRL 6.5 (GLOVE) ×1 IMPLANT
GLOVE BIOGEL PI IND STRL 8 (GLOVE) ×1 IMPLANT
GLOVE BIOGEL PI INDICATOR 6.5 (GLOVE) ×1
GLOVE BIOGEL PI INDICATOR 8 (GLOVE) ×1
GLOVE ECLIPSE 6.5 STRL STRAW (GLOVE) ×2 IMPLANT
GLOVE ECLIPSE 8.0 STRL XLNG CF (GLOVE) ×2 IMPLANT
GOWN STRL REUS W/ TWL LRG LVL3 (GOWN DISPOSABLE) ×3 IMPLANT
GOWN STRL REUS W/TWL LRG LVL3 (GOWN DISPOSABLE) ×3
HEMOSTAT SNOW SURGICEL 2X4 (HEMOSTASIS) IMPLANT
KIT MARKER MARGIN INK (KITS) ×2 IMPLANT
LIQUID BAND (GAUZE/BANDAGES/DRESSINGS) ×2 IMPLANT
NEEDLE HYPO 25X1 1.5 SAFETY (NEEDLE) ×2 IMPLANT
NS IRRIG 1000ML POUR BTL (IV SOLUTION) ×2 IMPLANT
PACK BASIN DAY SURGERY FS (CUSTOM PROCEDURE TRAY) ×2 IMPLANT
PENCIL BUTTON HOLSTER BLD 10FT (ELECTRODE) ×2 IMPLANT
SLEEVE SCD COMPRESS KNEE MED (MISCELLANEOUS) ×2 IMPLANT
SPONGE LAP 4X18 X RAY DECT (DISPOSABLE) ×2 IMPLANT
SUT MNCRL AB 4-0 PS2 18 (SUTURE) ×2 IMPLANT
SUT SILK 2 0 SH (SUTURE) IMPLANT
SUT VICRYL 3-0 CR8 SH (SUTURE) ×2 IMPLANT
SYR CONTROL 10ML LL (SYRINGE) ×2 IMPLANT
TOWEL OR 17X24 6PK STRL BLUE (TOWEL DISPOSABLE) ×2 IMPLANT
TOWEL OR NON WOVEN STRL DISP B (DISPOSABLE) ×2 IMPLANT
TUBE CONNECTING 20X1/4 (TUBING) IMPLANT
YANKAUER SUCT BULB TIP NO VENT (SUCTIONS) IMPLANT

## 2015-02-14 NOTE — Discharge Instructions (Signed)
[facility] °Office Phone Number [phone] ° °BREAST BIOPSY/ LUMPECTOMY: POST OP INSTRUCTIONS ° °Always review your discharge instruction sheet given to you by the facility where your surgery was performed. ° °IF YOU HAVE DISABILITY OR FAMILY LEAVE FORMS, YOU MUST BRING THEM TO THE OFFICE FOR PROCESSING.  DO NOT GIVE THEM TO YOUR DOCTOR. ° °1. A prescription for pain medication may be given to you upon discharge.  Take your pain medication as prescribed, if needed.  If narcotic pain medicine is not needed, then you may take acetaminophen (Tylenol) or ibuprofen (Advil) as needed. °2. Take your usually prescribed medications unless otherwise directed °3. If you need a refill on your pain medication, please contact your pharmacy.  They will contact our office to request authorization.  Prescriptions will not be filled after 5pm or on week-ends. °4. You should eat very light the first 24 hours after surgery, such as soup, crackers, pudding, etc.  Resume your normal diet the day after surgery. °5. Most patients will experience some swelling and bruising in the breast.  Ice packs and a good support bra will help.  Swelling and bruising can take several days to resolve.  °6. It is common to experience some constipation if taking pain medication after surgery.  Increasing fluid intake and taking a stool softener will usually help or prevent this problem from occurring.  A mild laxative (Milk of Magnesia or Miralax) should be taken according to package directions if there are no bowel movements after 48 hours. °7. Unless discharge instructions indicate otherwise, you may remove your bandages 24-48 hours after surgery, and you may shower at that time.  You may have steri-strips (small skin tapes) in place directly over the incision.  These strips should be left on the skin for 7-10 days.  If your surgeon used skin glue on the incision, you may shower in 24 hours.  The glue will flake off over the next 2-3  weeks.  Any sutures or staples will be removed at the office during your follow-up visit. °8. ACTIVITIES:  You may resume regular daily activities (gradually increasing) beginning the next day.  Wearing a good support bra or sports bra minimizes pain and swelling.  You may have sexual intercourse when it is comfortable. °a. You may drive when you no longer are taking prescription pain medication, you can comfortably wear a seatbelt, and you can safely maneuver your car and apply brakes. °b. RETURN TO WORK:  ______________________________________________________________________________________ °9. You should see your doctor in the office for a follow-up appointment approximately two weeks after your surgery.  Your doctor’s nurse will typically make your follow-up appointment when she calls you with your pathology report.  Expect your pathology report 2-3 business days after your surgery.  You may call to check if you do not hear from us after three days. ° ° ° °WHEN TO CALL YOUR DOCTOR: °1. Fever over 101.0 °2. Nausea and/or vomiting. °3. Extreme swelling or bruising. °4. Continued bleeding from incision. °5. Increased pain, redness, or drainage from the incision. ° °The clinic staff is available to answer your questions during regular business hours.  Please don’t hesitate to call and ask to speak to one of the nurses for clinical concerns.  If you have a medical emergency, go to the nearest emergency room or call 911.  A surgeon from [facility] is always on call at the hospital. ° °For further questions, please visit [email or web address]  ° ° °Post Anesthesia Home Care Instructions ° °  Activity: °Get plenty of rest for the remainder of the day. A responsible adult should stay with you for 24 hours following the procedure.  °For the next 24 hours, DO NOT: °-Drive a car °-Operate machinery °-Drink alcoholic beverages °-Take any medication unless instructed by your physician °-Make any legal  decisions or sign important papers. ° °Meals: °Start with liquid foods such as gelatin or soup. Progress to regular foods as tolerated. Avoid greasy, spicy, heavy foods. If nausea and/or vomiting occur, drink only clear liquids until the nausea and/or vomiting subsides. Call your physician if vomiting continues. ° °Special Instructions/Symptoms: °Your throat may feel dry or sore from the anesthesia or the breathing tube placed in your throat during surgery. If this causes discomfort, gargle with warm salt water. The discomfort should disappear within 24 hours. ° °

## 2015-02-14 NOTE — Anesthesia Preprocedure Evaluation (Signed)
Anesthesia Evaluation  Patient identified by MRN, date of birth, ID band Patient awake    Reviewed: Allergy & Precautions, NPO status , Patient's Chart, lab work & pertinent test results  Airway Mallampati: II  TM Distance: >3 FB Neck ROM: Full    Dental  (+) Teeth Intact, Dental Advisory Given   Pulmonary    breath sounds clear to auscultation       Cardiovascular  Rhythm:Regular Rate:Normal     Neuro/Psych    GI/Hepatic   Endo/Other    Renal/GU      Musculoskeletal   Abdominal   Peds  Hematology   Anesthesia Other Findings   Reproductive/Obstetrics                            Anesthesia Physical Anesthesia Plan  ASA: II  Anesthesia Plan: General   Post-op Pain Management:    Induction: Intravenous  Airway Management Planned: LMA  Additional Equipment:   Intra-op Plan:   Post-operative Plan:   Informed Consent: I have reviewed the patients History and Physical, chart, labs and discussed the procedure including the risks, benefits and alternatives for the proposed anesthesia with the patient or authorized representative who has indicated his/her understanding and acceptance.   Dental advisory given  Plan Discussed with: CRNA and Anesthesiologist  Anesthesia Plan Comments:         Anesthesia Quick Evaluation  

## 2015-02-14 NOTE — Anesthesia Procedure Notes (Signed)
Procedure Name: LMA Insertion Date/Time: 02/14/2015 11:46 AM Performed by: Lyndee Leo Pre-anesthesia Checklist: Patient identified, Emergency Drugs available, Suction available and Patient being monitored Patient Re-evaluated:Patient Re-evaluated prior to inductionOxygen Delivery Method: Circle System Utilized Preoxygenation: Pre-oxygenation with 100% oxygen Intubation Type: IV induction Ventilation: Mask ventilation without difficulty LMA: LMA inserted LMA Size: 4.0 Number of attempts: 1 Airway Equipment and Method: Bite block Placement Confirmation: positive ETCO2 Tube secured with: Tape Dental Injury: Teeth and Oropharynx as per pre-operative assessment

## 2015-02-14 NOTE — Anesthesia Postprocedure Evaluation (Signed)
  Anesthesia Post-op Note  Patient: Cynthia Ford  Procedure(s) Performed: Procedure(s): BREAST LUMPECTOMY WITH RADIOACTIVE SEED LOCALIZATION (Left)  Patient Location: PACU  Anesthesia Type: General   Level of Consciousness: awake, alert  and oriented  Airway and Oxygen Therapy: Patient Spontanous Breathing  Post-op Pain: mild  Post-op Assessment: Post-op Vital signs reviewed  Post-op Vital Signs: Reviewed  Last Vitals:  Filed Vitals:   02/14/15 1405  BP: 112/75  Pulse: 68  Temp: 36.7 C  Resp: 16    Complications: No apparent anesthesia complications

## 2015-02-14 NOTE — Interval H&P Note (Signed)
History and Physical Interval Note:  02/14/2015 11:29 AM  Cynthia Ford  has presented today for surgery, with the diagnosis of Left Breast Papilloma  The various methods of treatment have been discussed with the patient and family. After consideration of risks, benefits and other options for treatment, the patient has consented to  Procedure(s): BREAST LUMPECTOMY WITH RADIOACTIVE SEED LOCALIZATION (Left) as a surgical intervention .  The patient's history has been reviewed, patient examined, no change in status, stable for surgery.  I have reviewed the patient's chart and labs.  Questions were answered to the patient's satisfaction.     Barton Want A.

## 2015-02-14 NOTE — Transfer of Care (Signed)
Immediate Anesthesia Transfer of Care Note  Patient: Cynthia Ford  Procedure(s) Performed: Procedure(s): BREAST LUMPECTOMY WITH RADIOACTIVE SEED LOCALIZATION (Left)  Patient Location: PACU  Anesthesia Type:General  Level of Consciousness: awake, sedated and patient cooperative  Airway & Oxygen Therapy: Patient Spontanous Breathing and Patient connected to face mask oxygen  Post-op Assessment: Report given to RN and Post -op Vital signs reviewed and stable  Post vital signs: Reviewed and stable  Last Vitals:  Filed Vitals:   02/14/15 0956  BP: 122/80  Pulse: 65  Temp: 36.5 C  Resp: 20    Complications: No apparent anesthesia complications

## 2015-02-14 NOTE — H&P (Signed)
H&P   Cynthia Ford (MR# [id number])      H&P Info    Author Note Status Last Update User Last Update Date/Time   Erroll Luna, MD Signed Erroll Luna, MD 01/09/2015 11:06 AM    H&P    Expand All Collapse All   Cynthia Ford 01/09/2015 10:35 AM Location: [facility] Patient #: [id number] DOB: 1961/05/16 Single / Language: [name] / Race: Black or African American Female History of Present Illness [name] A. Nikolis Berent MD; 01/09/2015 11:03 AM) Patient words: eval left breast PAPILLOMA   Pt sent at the request of Dr [name] for left breast mass detected on mammogram. This was a screening mammogram. Pt denies any pain or mass in either breast but does have nipple discharge left breast clear. No history of breast cancer.    Diagnosis Breast, left, needle core biopsy, mass, 6 o'clock, slightly outer - DUCTAL PAPILLOMA. - SEE MICROSCOPIC DESCRIPTION.   CLINICAL DATA: Possible left breast mass at recent screening mammography.  EXAM: DIGITAL DIAGNOSTIC LEFT MAMMOGRAM  ULTRASOUND LEFT BREAST  COMPARISON: Previous examinations, including the screening mammogram dated 12/12/2014.  ACR Breast Density Category b: There are scattered areas of fibroglandular density.  FINDINGS: Spot compression views of the left breast confirm a small, oval, partially obscured mass in the posterior aspect of the 6 o'clock position of the breast. There is also a second mass more posteriorly located, partially included on the images, with similar mammographic features. The visualized margins of both masses are circumscribed. These are new since 12/09/2013.  On physical exam, no mass is palpable in the inferior left breast or left axilla.  Ultrasound is performed, showing a 7 x 6 x 5 mm oval, circumscribed, mixed echogenicity mass in the 6 o'clock position of the left breast, 2 cm from the nipple. This is horizontally oriented and has no internal blood flow with color  Doppler.  There is a 6 x 5 x 4 mm oval, mildly lobulated hypoechoic mass in the 6 o'clock position of the left breast, 5 cm from the nipple. This is vertically oriented in the radial plane.  Ultrasound of the left axilla demonstrated normal appearing left axillary lymph nodes.  IMPRESSION: Two new, solid masses in the 6 o'clock position of the left breast, as described above.  RECOMMENDATION: Ultrasound-guided core needle biopsy of both left breast masses. This is scheduled follow.  I have discussed the findings and recommendations with the patient. Results were also provided in writing at the conclusion of the visit. If applicable, a reminder letter will be sent to the patient regarding the next appointment.  BI-RADS CATEGORY 4: Suspicious.   Electronically Signed By: Enrique Sack M.D. On: 12/19/2014 12:12.  The patient is a 54 year old female   Other Problems [name], [place]; 01/09/2015 10:35 AM) Anxiety Disorder Asthma Back Pain Cholelithiasis Gastroesophageal Reflux Disease Lump In Breast Thyroid Disease  Past Surgical History [name], CMA; 01/09/2015 10:35 AM) Breast Biopsy Left. multiple Gallbladder Surgery - Laparoscopic Knee Surgery Left.  Diagnostic Studies History [name], CMA; 01/09/2015 10:35 AM) Colonoscopy 5-10 years ago Mammogram within last year Pap Smear 1-5 years ago  Allergies [name], CMA; 01/09/2015 10:38 AM) No Known Drug Allergies 01/09/2015  Medication History (Sonya Bynum, CMA; 01/09/2015 10:38 AM) Cristy Friedlander HFA (110MCG/ACT Aerosol, Inhalation) Active. Fluticasone Propionate (50MCG/ACT Suspension, Nasal as needed) Active. ZyrTEC Allergy (10MG  Tablet, Oral) Active.  Social History [name], CMA; 01/09/2015 10:35 AM) Alcohol use Occasional alcohol use. Caffeine use Carbonated beverages, Coffee,  Tea. No drug use Tobacco use Never smoker.  Family History [name], [place]; 01/09/2015 10:35  AM) Alcohol Abuse Father, Sister. Arthritis Mother. Colon Polyps Mother. Diabetes Mellitus Sister. Ischemic Bowel Disease Mother. Prostate Cancer Brother. Respiratory Condition Mother.  Pregnancy / Birth History [name], [place]; 01/09/2015 10:35 AM) Age at menarche 24 years. Age of menopause 2-55 Gravida 0 Irregular periods Para 0     Review of Systems [name] CMA; 01/09/2015 10:35 AM) General Not Present- Appetite Loss, Chills, Fatigue, Fever, Night Sweats, Weight Gain and Weight Loss. Skin Not Present- Change in Wart/Mole, Dryness, Hives, Jaundice, New Lesions, Non-Healing Wounds, Rash and Ulcer. HEENT Present- Hearing Loss, Seasonal Allergies, Sinus Pain and Visual Disturbances. Not Present- Earache, Hoarseness, Nose Bleed, Oral Ulcers, Ringing in the Ears, Sore Throat, Wears glasses/contact lenses and Yellow Eyes. Respiratory Not Present- Bloody sputum, Chronic Cough, Difficulty Breathing, Snoring and Wheezing. Breast Present- Breast Mass and Nipple Discharge. Not Present- Breast Pain and Skin Changes. Cardiovascular Not Present- Chest Pain, Difficulty Breathing Lying Down, Leg Cramps, Palpitations, Rapid Heart Rate, Shortness of Breath and Swelling of Extremities. Gastrointestinal Present- Nausea. Not Present- Abdominal Pain, Bloating, Bloody Stool, Change in Bowel Habits, Chronic diarrhea, Constipation, Difficulty Swallowing, Excessive gas, Gets full quickly at meals, Hemorrhoids, Indigestion, Rectal Pain and Vomiting. Female Genitourinary Not Present- Frequency, Nocturia, Painful Urination, Pelvic Pain and Urgency. Musculoskeletal Not Present- Back Pain, Joint Pain, Joint Stiffness, Muscle Pain, Muscle Weakness and Swelling of Extremities. Neurological Not Present- Decreased Memory, Fainting, Headaches, Numbness, Seizures, Tingling, Tremor, Trouble walking and Weakness. Psychiatric Not Present- Anxiety, Bipolar, Change in Sleep Pattern, Depression, Fearful and  Frequent crying. Endocrine Present- Hot flashes. Not Present- Cold Intolerance, Excessive Hunger, Hair Changes, Heat Intolerance and New Diabetes. Hematology Not Present- Easy Bruising, Excessive bleeding, Gland problems, HIV and Persistent Infections.  Vitals (Sonya Bynum CMA; 01/09/2015 10:37 AM) 01/09/2015 10:36 AM Weight: 158 lb Height: 64in Body Surface Area: 1.8 m Body Mass Index: 27.12 kg/m Temp.: 36F(Temporal)  Pulse: 76 (Regular)  BP: 128/80 (Sitting, Left Arm, Standard)     Physical Exam (Demone Lyles A. Boysie Bonebrake MD; 01/09/2015 11:03 AM)  General Mental Status-Alert. General Appearance-Consistent with stated age. Hydration-Well hydrated. Voice-Normal.  Head and Neck Head-normocephalic, atraumatic with no lesions or palpable masses. Trachea-midline. Thyroid Gland Characteristics - normal size and consistency.  Eye Eyeball - Bilateral-Extraocular movements intact. Sclera/Conjunctiva - Bilateral-No scleral icterus.  Chest and Lung Exam Chest and lung exam reveals -quiet, even and easy respiratory effort with no use of accessory muscles and on auscultation, normal breath sounds, no adventitious sounds and normal vocal resonance. Inspection Chest Wall - Normal. Back - normal.  Breast Breast - Left-Symmetric, Non Tender, No Biopsy scars, no Dimpling, No Inflammation, No Lumpectomy scars, No Mastectomy scars, No Peau d' Orange. Breast - Right-Symmetric, Non Tender, No Biopsy scars, no Dimpling, No Inflammation, No Lumpectomy scars, No Mastectomy scars, No Peau d' Orange. Breast Lump-No Palpable Breast Mass.  Cardiovascular Cardiovascular examination reveals -normal heart sounds, regular rate and rhythm with no murmurs and normal pedal pulses bilaterally.  Abdomen Inspection Inspection of the abdomen reveals - No Hernias. Skin - Scar - no surgical scars. Palpation/Percussion Palpation and Percussion of the abdomen reveal - Soft, Non  Tender, No Rebound tenderness, No Rigidity (guarding) and No hepatosplenomegaly. Auscultation Auscultation of the abdomen reveals - Bowel sounds normal.  Neurologic Neurologic evaluation reveals -alert and oriented x 3 with no impairment of recent or remote memory. Mental Status-Normal.  Musculoskeletal Normal Exam - Left-Upper Extremity Strength Normal  and Lower Extremity Strength Normal. Normal Exam - Right-Upper Extremity Strength Normal and Lower Extremity Strength Normal.  Lymphatic Head & Neck  General Head & Neck Lymphatics: Bilateral - Description - Normal. Axillary  General Axillary Region: Bilateral - Description - Normal. Tenderness - Non Tender. Femoral & Inguinal  Generalized Femoral & Inguinal Lymphatics: Bilateral - Description - Normal. Tenderness - Non Tender.    Assessment & Plan (Amandajo Gonder A. Randel Hargens MD; 01/09/2015 11:01 AM)  PAPILLOMA OF BREAST, LEFT (217  D24.2) Impression: recommend left breast lumpectomy due to small but finite risk of breast cancer with papilloma on core biopsy. Risk of lumpectomy include bleeding, infection, seroma, more surgery, use of seed/wire, wound care, cosmetic deformity and the need for other treatments, death , blood clots, death. Pt agrees to proceed.  Current Plans Pt Education - [facility] Breast Biopsy Instructions (FLB): discussed with patient and provided information.

## 2015-02-14 NOTE — Op Note (Signed)
Preoperative diagnosis: Left breast papilloma  Postoperative diagnosis: Same   Procedure: Left breast seed localized lumpectomy  Surgeon: Erroll Luna M.D.  Anesthesia: Gen. With 0.25% Sensorcaine local with epinephrine  EBL: 20 cc  Specimen: Left breast tissue with clip and radioactive seed in the specimen. Verified with neoprobe and radiographic image showing both seed and clip in specimen  Indications for procedure: The patient presents for left breast excisional lumpectomy after core biopsy showed papilloma. Discussed the rationale for considering excision. Small risk of malignancy associated with papilloma lesion after core biopsy. Discussed observation. Discussed wire localization. Patient desired excision of left breast papilloma.The procedure has been discussed with the patient. Alternatives to surgery have been discussed with the patient.  Risks of surgery include bleeding,  Infection,  Seroma formation, death,  and the need for further surgery.   The patient understands and wishes to proceed.   Description of procedure: Patient underwent seed placement as an outpatient. Patient presents today for left breast seed localized lumpectomy. Patient and holding area. Questions are answered and neoprobe used to verify seed location. Patient taken back to the operating room and placed upon the OR table. After induction of general anesthesia, left breast prepped and draped in a sterile fashion. Timeout was done to verify proper  Patient  And procedure. Neoprobe used and hot spot identified and left breast lower-outer quadrant. This was marked with pen. Curvilinear incision made left upper outer quadrant breast. Dissection used with the help of a neoprobe around the tissue where the seed and clip were located. Tissue removed in its entirety with gross margins.Marlis Edelson used and seen within specimen. Radiographs taken which show clip and seed  In specimen.hemostasis achieved and cavity closed with  3-0 Vicryl and 4-0 Monocryl. Dermabond applied. All final counts found to be correct. Specimen transported to pathology. Patient awoke extubated taken to recovery in satisfactory condition.

## 2015-02-15 ENCOUNTER — Encounter (HOSPITAL_BASED_OUTPATIENT_CLINIC_OR_DEPARTMENT_OTHER): Payer: Self-pay | Admitting: Surgery

## 2015-02-16 ENCOUNTER — Other Ambulatory Visit (INDEPENDENT_AMBULATORY_CARE_PROVIDER_SITE_OTHER): Payer: Self-pay | Admitting: Surgery

## 2015-02-16 DIAGNOSIS — D0512 Intraductal carcinoma in situ of left breast: Secondary | ICD-10-CM

## 2015-02-27 NOTE — Progress Notes (Signed)
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 - SURGICAL MARGINS NEGATIVE FOR TUMOR - PREVIOUS BIOPSY SITE IDENTIFIED - SEE TUMOR SYNOPTIC TEMPLATE BELOW.  [birth year] Diagnosis Breast, left, needle core biopsy, mass, 6 o'clock, slightly outer - DUCTAL PAPILLOMA. - SEE MICROSCOPIC DESCRIPTION.  Receptor Status: ER(100% +), PR (100%+), Her2-neu ()  Did patient present with symptoms (if so, please note symptoms) or was this found on screening mammography?: During annual exam - Doctor noted fluid from left breast and she then had a mammogram the same day.  Past/Anticipated interventions by surgeon, if any: 02/14/15 -Procedure: BREAST LUMPECTOMY WITH RADIOACTIVE SEED LOCALIZATION;  Surgeon: Erroll Luna, MD;  Location: [place];  Service: General;  Laterality: Left   Past/Anticipated interventions by medical oncology, if any: no  Lymphedema issues, if any:  no  Pain issues, if any:  Has occasional sharp pains in her left nipple area.  OB GYN history: Age at menarche 73 years. Age of menopause 60. Gravida 0 Irregular periodsPara 0  SAFETY ISSUES:  Prior radiation? no  Pacemaker/ICD? no  Possible current pregnancy?no  Is the patient on methotrexate? no  Current Complaints / other details:  Had surgery to left breast in 2010.  She is here with her partner.

## 2015-03-01 ENCOUNTER — Ambulatory Visit
Admission: RE | Admit: 2015-03-01 | Discharge: 2015-03-01 | Disposition: A | Discharge status: Home / Self Care | Payer: BLUE CROSS/BLUE SHIELD | Source: Ambulatory Visit | Attending: Radiation Oncology | Admitting: Radiation Oncology

## 2015-03-01 ENCOUNTER — Encounter: Payer: Self-pay | Admitting: Radiation Oncology

## 2015-03-01 VITALS — BP 131/75 | HR 67 | Temp 97.9°F | Resp 12 | Ht 64.0 in | Wt 159.9 lb

## 2015-03-01 DIAGNOSIS — Z510 Encounter for antineoplastic radiation therapy: Secondary | ICD-10-CM | POA: Diagnosis not present

## 2015-03-01 DIAGNOSIS — Z9049 Acquired absence of other specified parts of digestive tract: Secondary | ICD-10-CM | POA: Insufficient documentation

## 2015-03-01 DIAGNOSIS — D0512 Intraductal carcinoma in situ of left breast: Secondary | ICD-10-CM | POA: Insufficient documentation

## 2015-03-01 DIAGNOSIS — K219 Gastro-esophageal reflux disease without esophagitis: Secondary | ICD-10-CM | POA: Diagnosis not present

## 2015-03-01 DIAGNOSIS — Z7951 Long term (current) use of inhaled steroids: Secondary | ICD-10-CM | POA: Diagnosis not present

## 2015-03-01 DIAGNOSIS — J45909 Unspecified asthma, uncomplicated: Secondary | ICD-10-CM | POA: Insufficient documentation

## 2015-03-01 DIAGNOSIS — L599 Disorder of the skin and subcutaneous tissue related to radiation, unspecified: Secondary | ICD-10-CM | POA: Diagnosis not present

## 2015-03-01 DIAGNOSIS — C50912 Malignant neoplasm of unspecified site of left female breast: Secondary | ICD-10-CM

## 2015-03-01 DIAGNOSIS — Z79899 Other long term (current) drug therapy: Secondary | ICD-10-CM | POA: Insufficient documentation

## 2015-03-01 DIAGNOSIS — Z803 Family history of malignant neoplasm of breast: Secondary | ICD-10-CM | POA: Insufficient documentation

## 2015-03-01 DIAGNOSIS — C50512 Malignant neoplasm of lower-outer quadrant of left female breast: Secondary | ICD-10-CM | POA: Diagnosis not present

## 2015-03-01 NOTE — Progress Notes (Signed)
Radiation Oncology         [phone]) [phone] ________________________________  Initial outpatient Consultation  Name: Cynthia Ford MRN: [id number]  Date: 03/01/2015  DOB: 03-20-1961  ZL:[id number], MD  Erroll Luna, MD   REFERRING PHYSICIAN: Erroll Luna, MD  DIAGNOSIS: Low-grade intraductal carcinoma of the left breast  HISTORY OF PRESENT ILLNESS::Cynthia Ford is a 54 y.o. female who is seen out of the courtesy of Dr Christie Beckers for an opinion concerning radiation therapy as part of management of patient's recently diagnosed intraductal carcinoma the left breast. Recently on routine screening mammography the patient was noted to have a possible mass within the inferior aspect of the left breast. The patient proceeded to undergo additional imaging. On ultrasound this area measured 7 mm within the 6:00 position of left breast, 2 cm from the nipple. Patient proceeded to undergo ultrasound guided biopsy of this area which revealed ductal papilloma. Surgery was recommended for removal and on February 16 patient was taken to the operating room by Dr Brantley Stage at which time the patient underwent left breast seed localized lumpectomy. Upon pathologic review the patient was found to have a low-grade ductal carcinoma involving the intraductal papilloma. This measured 2 mm in greatest dimension. In addition a solitary  focus of ductal carcinoma in situ 1.5 mm from the nearest posterior margin was also noted. This was felt to be separate and distinct from the papilloma. The surgical margins were clear for both areas,  the closest margin being 1.5 mm posteriorly. Patient is been doing well since her surgery is now seen in radiation oncology for evaluation and consideration for breast conserving therapy.Marland Kitchen  PREVIOUS RADIATION THERAPY: No  PAST MEDICAL HISTORY:  has a past medical history of Asthma; Anxiety; GERD (gastroesophageal reflux disease); Anemia; PONV (postoperative nausea and vomiting); and  Papilloma of left breast.    PAST SURGICAL HISTORY: Past Surgical History  Procedure Laterality Date  . Cholecystectomy  2001  . Breast surgery  2010    LEFT  . Nasal passage  2000  . Knee arthroscopy Left 1999/1993  . Nasal septum surgery    . Breast lumpectomy with radioactive seed localization Left 02/14/2015    Procedure: BREAST LUMPECTOMY WITH RADIOACTIVE SEED LOCALIZATION;  Surgeon: Erroll Luna, MD;  Location: [facility];  Service: General;  Laterality: Left;    FAMILY HISTORY: family history includes Breast cancer in an other family member; Prostate cancer in her brother, brother, and maternal grandfather.  SOCIAL HISTORY:  reports that she has never smoked. She does not have any smokeless tobacco history on file. She reports that she does not drink alcohol or use illicit drugs. works full-time in Sales executive at [employer]. She is postmenopausal.  ALLERGIES: Review of patient's allergies indicates no known allergies.  MEDICATIONS:  Current Outpatient Prescriptions  Medication Sig Dispense Refill  . albuterol (PROVENTIL HFA;VENTOLIN HFA) 108 (90 BASE) MCG/ACT inhaler Inhale into the lungs every 6 (six) hours as needed for wheezing or shortness of breath.    . cetirizine (ZYRTEC) 10 MG tablet Take 10 mg by mouth daily.      . fluticasone (FLONASE) 50 MCG/ACT nasal spray Place into both nostrils daily.    . fluticasone (FLOVENT HFA) 110 MCG/ACT inhaler Inhale 1 puff into the lungs 2 (two) times daily.      Marland Kitchen omeprazole (PRILOSEC) 20 MG capsule Take 2 capsules (40 mg total) by mouth daily. 30 capsule 0  . oxyCODONE-acetaminophen (ROXICET) 5-325 MG per tablet Take 1 tablet  by mouth every 4 (four) hours as needed. 30 tablet 0   No current facility-administered medications for this encounter.    REVIEW OF SYSTEMS:  A 15 point review of systems is documented in the electronic medical record. This was obtained by the nursing staff. However, I reviewed this with  the patient to discuss relevant findings and make appropriate changes.  Prior to diagnosis patient denied any pain within the breast area nipple discharge or bleeding. She had one prior benign biopsy of left breast in the past. Patient denies any new bony pain headaches dizziness or blurred vision. She does experience hot flashes.   PHYSICAL EXAM:  height is _0  (1.626 m) and weight is 159 lb 14.4 oz (72.53 kg). Her oral temperature is 97.9 F (36.6 C). Her blood pressure is 131/75 and her pulse is 67. Her respiration is 12.   BP 131/75 mmHg  Pulse 67  Temp(Src) 97.9 F (36.6 C) (Oral)  Resp 12  Ht _1  (1.626 m)  Wt 159 lb 14.4 oz (72.53 kg)  BMI 27.43 kg/m2  General Appearance:    Alert, cooperative, no distress, appears stated age  Head:    Normocephalic, without obvious abnormality, atraumatic  Eyes:    PERRL, conjunctiva/corneas clear, EOM's intact,         Nose:   Nares normal, septum midline, mucosa normal, no drainage    or sinus tenderness  Throat:   Lips, mucosa, and tongue normal; teeth and gums normal  Neck:   Supple, symmetrical, trachea midline, no adenopathy;    thyroid:  no enlargement/tenderness/nodules; no carotid   bruit or JVD  Back:     Symmetric, no curvature, ROM normal, no CVA tenderness  Lungs:     Clear to auscultation bilaterally, respirations unlabored  Chest Wall:    No tenderness or deformity   Heart:    Regular rate and rhythm, S1 and S2 normal, no murmur, rub   or gallop  Breast Exam:    No tenderness, masses, or nipple abnormality involving the right breast; the left breast shows a well healing scar in the lower outer quadrant with "surgical glue" in place. No dominant masses appreciated with breakfast nipple discharge or bleeding.   Abdomen:     Soft, non-tender, bowel sounds active all four quadrants,    no masses, no organomegaly        Extremities:   Extremities normal, atraumatic, no cyanosis or edema  Pulses:   2+ and symmetric all  extremities  Skin:   Skin color, texture, turgor normal, no rashes or lesions  Lymph nodes:   Cervical, supraclavicular, and axillary nodes normal  Neurologic:    normal strength, sensation and reflexes    throughout     ECOG = 0  0 - Asymptomatic (Fully active, able to carry on all predisease activities without restriction)  LABORATORY DATA:  Lab Results  Component Value Date   WBC 11.5* 05/22/2013   HGB 13.7 02/14/2015   HCT 31.4* 05/22/2013   MCV 77.3* 05/22/2013   PLT 315 05/22/2013   NEUTROABS 6.3 05/22/2013   Lab Results  Component Value Date   NA 138 05/22/2013   K 3.9 05/22/2013   CL 101 05/22/2013   CO2 29 05/22/2013   GLUCOSE 91 05/22/2013   CREATININE 1.00 05/22/2013   CALCIUM 9.4 05/22/2013      RADIOGRAPHY: Mm Breast Surgical Specimen  02/14/2015   CLINICAL DATA:  54 year old female - left breast surgical specimen following papilloma excision.  EXAM: SPECIMEN RADIOGRAPH OF THE LEFT BREAST  COMPARISON:  Previous exam(s).  FINDINGS: Status post excision of the left breast. The radioactive seed and biopsy marker clip are present, completely intact, and are marked for pathology.  IMPRESSION: Specimen radiograph of the left breast.   Electronically Signed   By: Margarette Canada M.D.   On: 02/14/2015 12:22   Mm Lt Radioactive Seed Loc Mammo Guide  02/14/2015   CLINICAL DATA:  54 year old female for radioactive seed localization prior to left breast papilloma excision.  EXAM: MAMMOGRAPHIC GUIDED RADIOACTIVE SEED LOCALIZATION OF THE LEFT BREAST  COMPARISON:  Previous exam(s).  FINDINGS: Patient presents for radioactive seed localization prior to left surgical excision. I met with the patient and we discussed the procedure of seed localization including benefits and alternatives. We discussed the high likelihood of a successful procedure. We discussed the risks of the procedure including infection, bleeding, tissue injury and further surgery. We discussed the low dose of  radioactivity involved in the procedure. Informed, written consent was given.  The usual time-out protocol was performed immediately prior to the procedure.  Using mammographic guidance, sterile technique, 2% lidocaine and an I-125 radioactive seed, the biopsy clip was localized using a lateral approach. The follow-up mammogram images confirm the seed in the expected location and are marked for Dr. Brantley Stage.  Follow-up survey of the patient confirms presence of the radioactive seed.  Order number of I-125 seed:  [id number].  Total activity:  0.245 mCi  Reference Date: 12/06/2014  The patient tolerated the procedure well and was released from the [place]. She was given instructions regarding seed removal.  IMPRESSION: Radioactive seed localization left breast. No apparent complications.   Electronically Signed   By: Margarette Canada M.D.   On: 02/14/2015 09:47      IMPRESSION: Low-grade intraductal carcinoma of the left breast. The patient was found to have 2 separate foci immediately adjacent to one another with maximal dimension of 2 mm.  the lesions were completely excised. The DCIS was estrogen receptor positive at 100% and progesterone receptor positive at 100%. I discussed the patient's pathology in detail with her as well as close friend. Given the small size of the DCIS and low-grade nature she would be at low risk for recurrence within the breast but would  benefit from radiation therapy to reduce her chances for recurrence. I discussed with the patient that given her young age recommendations would be for radiation therapy as part of her overall management. At this time the patient is undecided whether she would like to proceed with radiation therapy. I would recommend she be seen in consultation with medical oncology for consultation concerning adjuvant hormonal therapy and further discussion of breast conserving therapy with or without radiation therapy. Once the patient has met with medical oncology  she will decide whether she would pursue radiation therapy as part of her overall management. I have requested consultation with one of the breast medical oncologists.     ------------------------------------------------  Blair Promise, PhD, MD

## 2015-03-01 NOTE — Progress Notes (Signed)
Please see the Nurse Progress Note in the MD Initial Consult Encounter for this patient. 

## 2015-03-02 ENCOUNTER — Telehealth: Payer: Self-pay | Admitting: *Deleted

## 2015-03-02 NOTE — Telephone Encounter (Signed)
Spoke with patient and confirmed new patient appointment for 03/03/15 at 1:30 for FIN and 2pm with Dr. Burr Medico.

## 2015-03-03 ENCOUNTER — Ambulatory Visit (HOSPITAL_BASED_OUTPATIENT_CLINIC_OR_DEPARTMENT_OTHER): Payer: BLUE CROSS/BLUE SHIELD | Admitting: Hematology

## 2015-03-03 ENCOUNTER — Telehealth: Payer: Self-pay | Admitting: Hematology

## 2015-03-03 ENCOUNTER — Ambulatory Visit: Payer: BLUE CROSS/BLUE SHIELD

## 2015-03-03 ENCOUNTER — Encounter: Payer: Self-pay | Admitting: Hematology

## 2015-03-03 ENCOUNTER — Encounter: Payer: Self-pay | Admitting: *Deleted

## 2015-03-03 VITALS — BP 122/75 | HR 68 | Temp 98.1°F | Resp 18 | Ht 64.0 in | Wt 160.4 lb

## 2015-03-03 DIAGNOSIS — D0512 Intraductal carcinoma in situ of left breast: Secondary | ICD-10-CM

## 2015-03-03 DIAGNOSIS — Z170 Estrogen receptor positive status [ER+]: Secondary | ICD-10-CM

## 2015-03-03 DIAGNOSIS — C50512 Malignant neoplasm of lower-outer quadrant of left female breast: Secondary | ICD-10-CM

## 2015-03-03 NOTE — Progress Notes (Signed)
[place]  Telephone:([phone]) [phone] Fax:([phone]) [place] Note   Patient Care Team: [name], MD as PCP - General (Family Medicine) 03/04/2015  CHIEF COMPLAINTS/PURPOSE OF CONSULTATION:  Left breast DCIS  Oncology History   Breast cancer of lower-outer quadrant of left female breast   Staging form: Breast, AJCC 7th Edition     Clinical: Stage 0 (Tis (DCIS), N0, M0) - Unsigned       Breast cancer of lower-outer quadrant of left female breast   12/19/2014 Pathology Results Ductal papilloma   12/19/2014 Mammogram 2 new solid masses (70mm, 26mm) in the 6:00 position of the left breast.   02/14/2015 Initial Diagnosis Breast cancer of lower-outer quadrant of left female breast   02/14/2015 Surgery Left breast lumpectomy, negative margins.    02/14/2015 Pathologic Stage Low-grade ductal carcinoma in situ involving intraductal papilloma, tumor 2 mm, ER 100%, PR 100% positive.    HISTORY OF PRESENTING ILLNESS:  Cynthia Ford 54 y.o. female is here because of recently diagnosed left breast DCIS. She presented with her female partner to the clinic today.  She had papilloma in 2010 and had lumpectomy. She has been followed up with annual mammogram. Her mammogram on 12/19/2014 showed a 2 new solid mass in the 6:00 position of the left breast. She underwent biopsy which showed ductal papilloma. She subsequently underwent left breast lumpectomy on 02/14/2015, and the surgical path showed DCIS and intraductal papilloma.  She has recovered very well from surgery. She has minimal discomfort at the surgical site, has good appetite and energy level. No other complaints.  In terms of breast cancer risk profile:  She menarched at early age of 17 and went to menopause at age 63 She had 0 pregnancy.  She never received birth control pills for approximately .  She was  never exposed to fertility medications or hormone replacement therapy.  She has  family history of  Breast cancer in her niece   MEDICAL HISTORY:  Past Medical History  Diagnosis Date  . Asthma   . Anxiety   . GERD (gastroesophageal reflux disease)   . Anemia   . PONV (postoperative nausea and vomiting)   . Papilloma of left breast     SURGICAL HISTORY: Past Surgical History  Procedure Laterality Date  . Cholecystectomy  2001  . Breast surgery  2010    LEFT  . Nasal passage  2000  . Knee arthroscopy Left 1999/1993  . Nasal septum surgery    . Breast lumpectomy with radioactive seed localization Left 02/14/2015    Procedure: BREAST LUMPECTOMY WITH RADIOACTIVE SEED LOCALIZATION;  Surgeon: Erroll Luna, MD;  Location: [facility];  Service: General;  Laterality: Left;    SOCIAL HISTORY: History   Social History  . Marital Status: Single    Spouse Name: N/A  . Number of Children: 0  . 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 Smoking status: Never Smoker   . Smokeless tobacco: Not on file  . Alcohol Use: No  . Drug Use: No  . Sexual Activity: Yes    Birth Control/ Protection: Post-menopausal   Other Topics Concern  . Not on file   Social History Narrative    FAMILY HISTORY: Family History  Problem Relation Age of Onset  . Breast cancer  53    niece  . Prostate cancer Brother   . Prostate cancer Brother   .  Prostate cancer Maternal Grandfather     ALLERGIES:  has No Known Allergies.  MEDICATIONS:  Current Outpatient Prescriptions  Medication Sig Dispense Refill  . albuterol (PROVENTIL HFA;VENTOLIN HFA) 108 (90 BASE) MCG/ACT inhaler Inhale into the lungs every 6 (six) hours as needed for wheezing or shortness of breath.    . cetirizine (ZYRTEC) 10 MG tablet Take 10 mg by mouth daily.      . fluticasone (FLONASE) 50 MCG/ACT nasal spray Place into both nostrils daily.    . fluticasone (FLOVENT HFA) 110 MCG/ACT inhaler Inhale 1 puff into the lungs 2 (two) times daily.      Marland Kitchen  omeprazole (PRILOSEC) 20 MG capsule Take 2 capsules (40 mg total) by mouth daily. 30 capsule 0  . oxyCODONE-acetaminophen (ROXICET) 5-325 MG per tablet Take 1 tablet by mouth every 4 (four) hours as needed. 30 tablet 0   No current facility-administered medications for this visit.    REVIEW OF SYSTEMS:   Constitutional: Denies fevers, chills or abnormal night sweats Eyes: Denies blurriness of vision, double vision or watery eyes Ears, nose, mouth, throat, and face: Denies mucositis or sore throat Respiratory: Denies cough, dyspnea or wheezes Cardiovascular: Denies palpitation, chest discomfort or lower extremity swelling Gastrointestinal:  Denies nausea, heartburn or change in bowel habits Skin: Denies abnormal skin rashes Lymphatics: Denies new lymphadenopathy or easy bruising Neurological:Denies numbness, tingling or new weaknesses Behavioral/Psych: Mood is stable, no new changes  All other systems were reviewed with the patient and are negative.  PHYSICAL EXAMINATION: ECOG PERFORMANCE STATUS: 0 - Asymptomatic  Filed Vitals:   03/03/15 1358  BP: 122/75  Pulse: 68  Temp: 98.1 F (36.7 C)  Resp: 18   Filed Weights   03/03/15 1358  Weight: 160 lb 6.4 oz (72.757 kg)    GENERAL:alert, no distress and comfortable SKIN: skin color, texture, turgor are normal, no rashes or significant lesions EYES: normal, conjunctiva are pink and non-injected, sclera clear OROPHARYNX:no exudate, no erythema and lips, buccal mucosa, and tongue normal  NECK: supple, thyroid normal size, non-tender, without nodularity LYMPH:  no palpable lymphadenopathy in the cervical, axillary or inguinal LUNGS: clear to auscultation and percussion with normal breathing effort HEART: regular rate & rhythm and no murmurs and no lower extremity edema ABDOMEN:abdomen soft, non-tender and normal bowel sounds Musculoskeletal:no cyanosis of digits and no clubbing  PSYCH: alert & oriented x 3 with fluent  speech NEURO: no focal motor/sensory deficits Breasts: Breast inspection showed them to be symmetrical with no nipple discharge. The surgical scar at the left breast inferior to the nipple is healing well, without discharge or skin erythema. Palpation of the breasts and axilla revealed no obvious mass that I could appreciate.  LABORATORY DATA:  I have reviewed the data as listed Lab Results  Component Value Date   WBC 11.5* 05/22/2013   HGB 13.7 02/14/2015   HCT 31.4* 05/22/2013   MCV 77.3* 05/22/2013   PLT 315 05/22/2013   PATHOLOGY REPORT: Diagnosis 12/19/2014 Breast, left, needle core biopsy, mass, 6 o'clock, slightly outer - DUCTAL PAPILLOMA. - SEE MICROSCOPIC DESCRIPTION.  Diagnosis 02/14/2015 Breast, lumpectomy, left - LOW GRADE DUCTAL CARCINOMA IN SITU INVOLVING INTRADUCTAL PAPILLOMA, SEE COMMENT. - SURGICAL MARGINS NEGATIVE FOR TUMOR - PREVIOUS BIOPSY SITE IDENTIFIED - SEE TUMOR SYNOPTIC TEMPLATE BELOW.  Specimen, including laterality: Left breast Procedure (include lymph node sampling sentinel-non-sentinel): Lumpectomy without lymph node sampling Grade of carcinoma: I of III Necrosis: Absent Estimated tumor size: 35mm See comment Treatment effect: None If present, treatment  effect in breast tissue, lymph nodes or both: N/A Distance to closest margin: 2 mm (anterior) and 1.5 mm (posterior), see comment. If margin positive, focally or broadly: N/A Breast prognostic profile: Pending and will be reported in an addendum. Lymph nodes: Examined: 0 Sentinel 0 Non-sentinel 0 Total Lymph nodes with metastasis: N/A Isolated tumor cells (< 0.2 mm): N/A Micrometastasis ( > 0.2 mm and < 2.0 mm): N/A Macrometastasis (> 2.0 mm): N/A Extranodal extension: N/A PROGNOSTIC INDICATORS - ACIS TNM: pTis, pNX Results: IMMUNOHISTOCHEMICAL AND MORPHOMETRIC ANALYSIS BY THE AUTOMATED CELLULAR IMAGING SYSTEM (ACIS) Estrogen Receptor: 100%, POSITIVE, STRONG STAINING INTENSITY Progesterone  Receptor: 100%, POSITIVE, STRONG STAINING INTENSITY  RADIOGRAPHIC STUDIES: I have personally reviewed the radiological images as listed and agreed with the findings in the report.  Screening mammogram and [place] 12/19/2014 FINDINGS: Spot compression views of the left breast confirm a small, oval, partially obscured mass in the posterior aspect of the 6 o'clock position of the breast. There is also a second mass more posteriorly located, partially included on the images, with similar mammographic features. The visualized margins of both masses are circumscribed. These are new since 12/09/2013.  On physical exam, no mass is palpable in the inferior left breast or left axilla.  Ultrasound is performed, showing a 7 x 6 x 5 mm oval, circumscribed, mixed echogenicity mass in the 6 o'clock position of the left breast, 2 cm from the nipple. This is horizontally oriented and has no internal blood flow with color Doppler.  There is a 6 x 5 x 4 mm oval, mildly lobulated hypoechoic mass in the 6 o'clock position of the left breast, 5 cm from the nipple. This is vertically oriented in the radial plane.  Ultrasound of the left axilla demonstrated normal appearing left axillary lymph nodes.  IMPRESSION: Two new, solid masses in the 6 o'clock position of the left breast, as described above.  ASSESSMENT & PLAN: 54 year old African-American postmenopausal female   1. left breast DCIS and intraductal papilloma s/p lumpectomy, ER/PR positive -The patient had early stage disease. She is considered cured of disease.  Any form of adjuvant treatment is for prevention of disease recurrence.  She is currently undergoing assessment and treatment for adjuvant radiation therapy.  I plan to see her back in approximately 6-8 weeks for further discussion about the role of adjuvant endocrine therapy, in view that her disease stained positive for estrogen receptor.  My plan would be to start her on adjuvant  Arimidex after she recovers from radiation treatment  We discussed healthy diet and a regular exercise, which promotes overall health and reduce risk of breast cancer  2. Genetic -Her maternal niece had breast cancer at age of 61, 2 brothers has prostate cancer -I'll refer her to genetic counselor for further genetic testing to ruled out inheritable breast cancer  3. Bone health -She never had bone density scan before. I'll obtain one before her next visit  -I recommend her to take calcium and vitamin D  All questions were answered. The patient knows to call the clinic with any problems, questions or concerns. I spent 40 minutes counseling the patient face to face. The total time spent in the appointment was 55 minutes and more than 50% was on counseling.     Truitt Merle, MD 03/04/2015 11:08 AM

## 2015-03-03 NOTE — Telephone Encounter (Signed)
Gave asv & calendar for April.

## 2015-03-03 NOTE — Progress Notes (Signed)
Spoke with patient at her appointment with Dr. Burr Medico.  Contact information given.  No questions or concerns at this time.  Encouraged her to call with any needs or concerns.

## 2015-03-03 NOTE — Telephone Encounter (Signed)
Scheduled genetics council per 03/04 POF, mailed schedule to pt... Cynthia Ford

## 2015-03-04 ENCOUNTER — Encounter: Payer: Self-pay | Admitting: Hematology

## 2015-03-10 ENCOUNTER — Encounter: Payer: Self-pay | Admitting: *Deleted

## 2015-03-10 NOTE — Progress Notes (Signed)
[place] [facility] Psychosocial Distress Screening Clinical Social Work  Clinical Social Work was referred by distress screening protocol.  The patient scored a 5 on the Psychosocial Distress Thermometer which indicates moderate distress. Clinical Social Worker phoned pt to assess for distress and other psychosocial needs. CSW spoke with pt and she is feeling less distressed after seeing the doctor at her last visit and getting more information. She is anxious to begin radiation in the coming weeks. CSW reviewed role of CSW and availability of Patient and Family Support Team, Breast Cancer Support groups and [facility] resources. Pt appreciated call and agrees to reach out to CSW as needed.   ONCBCN DISTRESS SCREENING 03/01/2015  Screening Type Initial Screening  Distress experienced in past week (1-10) 5  Practical problem type Work/school  Information Concerns Type Lack of info about diagnosis    Clinical Social Worker follow up needed: No.  If yes, follow up plan:  Loren Racer, [place]  [facility] Phone: [phone] Fax: [phone]

## 2015-03-24 ENCOUNTER — Telehealth: Payer: Self-pay | Admitting: Hematology

## 2015-03-24 NOTE — Telephone Encounter (Signed)
OFRI/CAT ON PAL 5/5. MOVED 5/5 GENETICS APPOINTMENT TO 5/12. SPOKE WITH PATIENT SHE IS AWARE. ALSO CONFIRMED APPOINTMENTS FOR 4/29 REMAIN THE SAME.

## 2015-03-29 NOTE — Progress Notes (Signed)
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 - SURGICAL MARGINS NEGATIVE FOR TUMOR - PREVIOUS BIOPSY SITE IDENTIFIED - SEE TUMOR SYNOPTIC TEMPLATE BELOW.  47/65/46 Diagnosis Breast, left, needle core biopsy, mass, 6 o'clock, slightly outer - DUCTAL PAPILLOMA. - SEE MICROSCOPIC DESCRIPTION.  Receptor Status: ER(100% +), PR (100%+), Her2-neu ()  Did patient present with symptoms (if so, please note symptoms) or was this found on screening mammography?: During annual exam - Doctor noted fluid from left breast and she then had a mammogram the same day.  Past/Anticipated interventions by surgeon, if any: 02/14/15 -Procedure: BREAST LUMPECTOMY WITH RADIOACTIVE SEED LOCALIZATION; Surgeon: Erroll Luna, MD; Location: [place] [facility]; Service: General; Laterality: Left   Past/Anticipated interventions by medical oncology, if any: plans for adjuvant Arimidex after she recovers from radiation treatment.  Lymphedema issues, if any: no  Pain issues, if any: no  OB GYN history: Age at menarche 35 years. Age of menopause 40. Gravida 0 Irregular periodsPara 0  SAFETY ISSUES:  Prior radiation? no  Pacemaker/ICD? no  Possible current pregnancy?no  Is the patient on methotrexate? no  Current Complaints / other details: Had surgery to left breast in 2010. Patient is here with her partner.

## 2015-03-30 ENCOUNTER — Encounter: Payer: Self-pay | Admitting: Radiation Oncology

## 2015-03-30 ENCOUNTER — Ambulatory Visit
Admission: RE | Admit: 2015-03-30 | Discharge: 2015-03-30 | Disposition: A | Discharge status: Home / Self Care | Payer: BLUE CROSS/BLUE SHIELD | Source: Ambulatory Visit | Attending: Radiation Oncology | Admitting: Radiation Oncology

## 2015-03-30 VITALS — BP 119/78 | HR 72 | Temp 97.7°F | Resp 12 | Ht 64.0 in | Wt 160.9 lb

## 2015-03-30 DIAGNOSIS — Z510 Encounter for antineoplastic radiation therapy: Secondary | ICD-10-CM | POA: Diagnosis not present

## 2015-03-30 DIAGNOSIS — C50512 Malignant neoplasm of lower-outer quadrant of left female breast: Secondary | ICD-10-CM

## 2015-03-30 NOTE — Progress Notes (Signed)
Please see the Nurse Progress Note in the MD Initial Consult Encounter for this patient. 

## 2015-03-30 NOTE — Progress Notes (Signed)
  Radiation Oncology         [phone] ________________________________  Name: Cynthia Ford MRN: [id number]  Date: 03/30/2015  DOB: 1961-07-27  Follow-Up Visit Note  CC: Cynthia Amel, MD  Cynthia Luna, MD    ICD-9-CM ICD-10-CM   1. Breast cancer of lower-outer quadrant of left female breast 174.5 C50.512     Diagnosis:  Low-grade intraductal carcinoma of the left breast    Narrative:  The patient returns today for further evaluation. She has been seen by medical oncology who recommended adjuvant hormonal therapy and consideration for radiation treatments. After careful consideration the patient would like to proceed with radiation therapy as part of her management. She has some occasional soreness within the breast but no pain at this time. She denies any nipple discharge or bleeding.                           ALLERGIES:  has No Known Allergies.  Meds: Current Outpatient Prescriptions  Medication Sig Dispense Refill  . albuterol (PROVENTIL HFA;VENTOLIN HFA) 108 (90 BASE) MCG/ACT inhaler Inhale into the lungs every 6 (six) hours as needed for wheezing or shortness of breath.    . cetirizine (ZYRTEC) 10 MG tablet Take 10 mg by mouth daily.      . fluticasone (FLONASE) 50 MCG/ACT nasal spray Place into both nostrils daily.    . fluticasone (FLOVENT HFA) 110 MCG/ACT inhaler Inhale 1 puff into the lungs 2 (two) times daily.      Marland Kitchen omeprazole (PRILOSEC) 20 MG capsule Take 2 capsules (40 mg total) by mouth daily. 30 capsule 0  . oxyCODONE-acetaminophen (ROXICET) 5-325 MG per tablet Take 1 tablet by mouth every 4 (four) hours as needed. (Patient not taking: Reported on 03/30/2015) 30 tablet 0   No current facility-administered medications for this encounter.    Physical Findings: The patient is in no acute distress. Patient is alert and oriented.  height is 5\' 4"  (1.626 m) and weight is 160 lb 14.4 oz (72.984 kg). Her oral temperature is 97.7 F (36.5 C). Her blood pressure is  119/78 and her pulse is 72. Her respiration is 12. .  The lungs are clear. The heart has a regular rhythm and rate. The left breast shows a well-healed scar in the lower outer quadrant without signs of drainage or infection. No dominant masses appreciated in the breast.  Lab Findings: Lab Results  Component Value Date   WBC 11.5* 05/22/2013   HGB 13.7 02/14/2015   HCT 31.4* 05/22/2013   MCV 77.3* 05/22/2013   PLT 315 05/22/2013    Radiographic Findings: No results found.  Impression:  Intraductal carcinoma of the left breast. The patient would be a good candidate for breast conservation with radiation therapy directed at the left breast area. I discussed the treatment course side effects and potential toxicities of radiation therapy in this situation with the patient and her significant other. The patient appears to understand and wishes to proceed with planned course of treatment.  Plan:  Simulation and planning later this afternoon. I anticipate 6 and half weeks of radiation therapy as part of her management.  ____________________________________ Blair Promise, MD

## 2015-04-03 NOTE — Progress Notes (Signed)
  Radiation Oncology         ([phone]) [phone] ________________________________  Name: Cynthia Ford MRN: [id number]  Date: 03/30/2015  DOB: 01-25-1961  RESPIRATORY MOTION MANAGEMENT SIMULATION  NARRATIVE:  In order to account for effect of respiratory motion on target structures and other organs in the planning and delivery of radiotherapy, this patient underwent respiratory motion management simulation.  To accomplish this, when the patient was brought to the CT simulation planning suite, 4D respiratoy motion management CT images were obtained.  The CT images were loaded into the planning software.  Then, using a variety of tools including Cine, MIP, and standard views, the target volume and planning target volumes (PTV) were delineated.  Avoidance structures were contoured.  Treatment planning then occurred.  Dose volume histograms were generated and reviewed for each of the requested structure.  The resulting plan was carefully reviewed and approved today.  -----------------------------------  Blair Promise, PhD, MD

## 2015-04-03 NOTE — Progress Notes (Signed)
  Radiation Oncology         (336) [phone] ________________________________  Name: Cynthia Ford MRN: [id number]  Date: 03/30/2015  DOB: 05/14/1961  Optical Surface Tracking Plan:  Since intensity modulated radiotherapy (IMRT) and 3D conformal radiation treatment methods are predicated on accurate and precise positioning for treatment, intrafraction motion monitoring is medically necessary to ensure accurate and safe treatment delivery.  The ability to quantify intrafraction motion without excessive ionizing radiation dose can only be performed with optical surface tracking. Accordingly, surface imaging offers the opportunity to obtain 3D measurements of patient position throughout IMRT and 3D treatments without excessive radiation exposure.  I am ordering optical surface tracking for this patient's upcoming course of radiotherapy. ________________________________  Blair Promise, MD 04/03/2015 6:36 PM    Reference:   Particia Jasper, et al. Surface imaging-based analysis of intrafraction motion for breast radiotherapy patients.Journal of [place], n. 6, nov. 2014. ISSN [id number].   Available at: <http://www.jacmp.org/index.php/jacmp/article/view/4957>.

## 2015-04-03 NOTE — Progress Notes (Signed)
  Radiation Oncology         (336) [phone] ________________________________  Name: Cynthia Ford MRN: [id number]  Date: 03/30/2015  DOB: 1961/07/03  SIMULATION AND TREATMENT PLANNING NOTE    ICD-9-CM ICD-10-CM   1. Breast cancer of lower-outer quadrant of left female breast 174.5 C50.512     DIAGNOSIS:  Low-grade intraductal carcinoma of the left breast  NARRATIVE:  The patient was brought to the [facility].  Identity was confirmed.  All relevant records and images related to the planned course of therapy were reviewed.  The patient freely provided informed written consent to proceed with treatment after reviewing the details related to the planned course of therapy. The consent form was witnessed and verified by the simulation staff.  Then, the patient was set-up in a stable reproducible  supine position for radiation therapy.  CT images were obtained.  Surface markings were placed.  The CT images were loaded into the planning software.  Then the target and avoidance structures were contoured.  Treatment planning then occurred.  The radiation prescription was entered and confirmed.  Then, I designed and supervised the construction of a total of 3 medically necessary complex treatment devices.  I have requested : 3D Simulation  I have requested a DVH of the following structures: lungs, heart, lumpectomy cavity.  I have ordered:dose calc.  PLAN:  The patient will receive 50.4 Gy in 28 fractions followed by a boost to the lumpectomy cavity of 12 gray and a cumulative dose of 62.4 Gy.  ________________________________ -----------------------------------  Blair Promise, PhD, MD

## 2015-04-05 DIAGNOSIS — Z510 Encounter for antineoplastic radiation therapy: Secondary | ICD-10-CM | POA: Diagnosis not present

## 2015-04-06 DIAGNOSIS — Z510 Encounter for antineoplastic radiation therapy: Secondary | ICD-10-CM | POA: Diagnosis not present

## 2015-04-07 ENCOUNTER — Ambulatory Visit
Admission: RE | Admit: 2015-04-07 | Discharge: 2015-04-07 | Disposition: A | Discharge status: Home / Self Care | Payer: BLUE CROSS/BLUE SHIELD | Source: Ambulatory Visit | Attending: Radiation Oncology | Admitting: Radiation Oncology

## 2015-04-07 DIAGNOSIS — Z510 Encounter for antineoplastic radiation therapy: Secondary | ICD-10-CM | POA: Diagnosis not present

## 2015-04-07 DIAGNOSIS — C50512 Malignant neoplasm of lower-outer quadrant of left female breast: Secondary | ICD-10-CM

## 2015-04-07 MED ORDER — ALRA NON-METALLIC DEODORANT (RAD-ONC): 1.0000 "application " | Freq: Once | TOPICAL | Status: DC

## 2015-04-07 MED ORDER — RADIAPLEXRX EX GEL: Freq: Once | CUTANEOUS | Status: DC

## 2015-04-07 NOTE — Progress Notes (Signed)
Pt here for patient teaching.  Pt given Radiation and You booklet, skin care instructions, Alra deodorant and Radiaplex gel. Reviewed areas of pertinence such as fatigue, skin changes, breast tenderness and breast swelling . Pt able to give teach back of to pat skin and use unscented/gentle soap,apply Radiaplex bid, avoid applying anything to skin within 4 hours of treatment and avoid wearing an under wire bra. Pt demonstrated understanding of information given and will contact nursing with any questions or concerns.

## 2015-04-10 ENCOUNTER — Ambulatory Visit
Admission: RE | Admit: 2015-04-10 | Discharge: 2015-04-10 | Disposition: A | Discharge status: Home / Self Care | Payer: BLUE CROSS/BLUE SHIELD | Source: Ambulatory Visit | Attending: Radiation Oncology | Admitting: Radiation Oncology

## 2015-04-10 DIAGNOSIS — Z510 Encounter for antineoplastic radiation therapy: Secondary | ICD-10-CM | POA: Diagnosis not present

## 2015-04-11 ENCOUNTER — Ambulatory Visit: Payer: BLUE CROSS/BLUE SHIELD

## 2015-04-11 ENCOUNTER — Ambulatory Visit: Payer: BLUE CROSS/BLUE SHIELD | Attending: Radiation Oncology | Admitting: Radiation Oncology

## 2015-04-12 ENCOUNTER — Ambulatory Visit
Admission: RE | Admit: 2015-04-12 | Discharge: 2015-04-12 | Disposition: A | Discharge status: Home / Self Care | Payer: BLUE CROSS/BLUE SHIELD | Source: Ambulatory Visit | Attending: Radiation Oncology | Admitting: Radiation Oncology

## 2015-04-12 ENCOUNTER — Encounter: Payer: Self-pay | Admitting: Radiation Oncology

## 2015-04-12 VITALS — BP 126/81 | HR 69 | Temp 97.7°F | Resp 16 | Ht 64.0 in | Wt 161.9 lb

## 2015-04-12 DIAGNOSIS — C50512 Malignant neoplasm of lower-outer quadrant of left female breast: Secondary | ICD-10-CM

## 2015-04-12 DIAGNOSIS — Z510 Encounter for antineoplastic radiation therapy: Secondary | ICD-10-CM | POA: Diagnosis not present

## 2015-04-12 NOTE — Progress Notes (Signed)
Cynthia Ford has completed 2 fractions to her left breast.  She denies pain but has noticed some soreness in her left breast since treatment started on Monday.  She denies fatigue.  She denies any skin changes in her left breast.  She is using radiaplex.  BP 126/81 mmHg  Pulse 69  Temp(Src) 97.7 F (36.5 C) (Oral)  Resp 16  Ht 5\' 4"  (1.626 m)  Wt 161 lb 14.4 oz (73.437 kg)  BMI 27.78 kg/m2

## 2015-04-12 NOTE — Progress Notes (Signed)
  Radiation Oncology         (336) [phone] ________________________________  Name: Cynthia Ford MRN: [id number]  Date: 04/12/2015  DOB: 03/24/1961  Weekly Radiation Therapy Management  DIAGNOSIS: Low-grade intraductal carcinoma of the left breast  Current Dose: 3.6 Gy     Planned Dose:  62.4 Gy  Narrative . . . . . . . . The patient presents for routine under treatment assessment.                                   The patient is without complaint. She noticed some mild soreness in her breast after her first treatment.                                 Set-up films were reviewed.                                 The chart was checked. Physical Findings. . .  height is 5\' 4"  (1.626 m) and weight is 161 lb 14.4 oz (73.437 kg). Her oral temperature is 97.7 F (36.5 C). Her blood pressure is 126/81 and her pulse is 69. Her respiration is 16. . Weight essentially stable.  No appreciable radiation reaction noted in the left breast at this time. Impression . . . . . . . The patient is tolerating radiation. Plan . . . . . . . . . . . . Continue treatment as planned.  ________________________________   Blair Promise, PhD, MD

## 2015-04-13 ENCOUNTER — Ambulatory Visit
Admission: RE | Admit: 2015-04-13 | Discharge: 2015-04-13 | Disposition: A | Discharge status: Home / Self Care | Payer: BLUE CROSS/BLUE SHIELD | Source: Ambulatory Visit | Attending: Radiation Oncology | Admitting: Radiation Oncology

## 2015-04-13 DIAGNOSIS — Z510 Encounter for antineoplastic radiation therapy: Secondary | ICD-10-CM | POA: Diagnosis not present

## 2015-04-14 ENCOUNTER — Ambulatory Visit
Admission: RE | Admit: 2015-04-14 | Discharge: 2015-04-14 | Disposition: A | Discharge status: Home / Self Care | Payer: BLUE CROSS/BLUE SHIELD | Source: Ambulatory Visit | Attending: Radiation Oncology | Admitting: Radiation Oncology

## 2015-04-14 DIAGNOSIS — Z510 Encounter for antineoplastic radiation therapy: Secondary | ICD-10-CM | POA: Diagnosis not present

## 2015-04-17 ENCOUNTER — Ambulatory Visit
Admission: RE | Admit: 2015-04-17 | Discharge: 2015-04-17 | Disposition: A | Discharge status: Home / Self Care | Payer: BLUE CROSS/BLUE SHIELD | Source: Ambulatory Visit | Attending: Radiation Oncology | Admitting: Radiation Oncology

## 2015-04-17 DIAGNOSIS — Z510 Encounter for antineoplastic radiation therapy: Secondary | ICD-10-CM | POA: Diagnosis not present

## 2015-04-18 ENCOUNTER — Ambulatory Visit
Admission: RE | Admit: 2015-04-18 | Discharge: 2015-04-18 | Disposition: A | Discharge status: Home / Self Care | Payer: BLUE CROSS/BLUE SHIELD | Source: Ambulatory Visit | Attending: Radiation Oncology | Admitting: Radiation Oncology

## 2015-04-18 ENCOUNTER — Encounter: Payer: Self-pay | Admitting: Radiation Oncology

## 2015-04-18 VITALS — BP 113/75 | HR 70 | Resp 16 | Wt 159.5 lb

## 2015-04-18 DIAGNOSIS — C50512 Malignant neoplasm of lower-outer quadrant of left female breast: Secondary | ICD-10-CM

## 2015-04-18 DIAGNOSIS — Z510 Encounter for antineoplastic radiation therapy: Secondary | ICD-10-CM | POA: Diagnosis not present

## 2015-04-18 NOTE — Progress Notes (Signed)
Denies skin changes of left/treated breast. Reports using radiaplex bid as directed. Denies fatigue. Reports allergic rhinitis. Denies pain. Weight and vitals stable.

## 2015-04-18 NOTE — Progress Notes (Signed)
  Radiation Oncology         (336) [phone] ________________________________  Name: Cynthia Ford MRN: [id number]  Date: 04/18/2015  DOB: [birth year]  Weekly Radiation Therapy Management  DIAGNOSIS:  intraductal carcinoma of the left breast  Current Dose: 10.8 Gy     Planned Dose:  62.4 Gy  Narrative . . . . . . . . The patient presents for routine under treatment assessment.                                   The patient is without complaint.                                 Set-up films were reviewed.                                 The chart was checked. Physical Findings. . .  weight is 159 lb 8 oz (72.349 kg). Her blood pressure is 113/75 and her pulse is 70. Her respiration is 16. . Weight essentially stable.  No significant changes. Impression . . . . . . . The patient is tolerating radiation. Plan . . . . . . . . . . . . Continue treatment as planned.  ________________________________   Blair Promise, PhD, MD

## 2015-04-19 ENCOUNTER — Ambulatory Visit
Admission: RE | Admit: 2015-04-19 | Discharge: 2015-04-19 | Disposition: A | Discharge status: Home / Self Care | Payer: BLUE CROSS/BLUE SHIELD | Source: Ambulatory Visit | Attending: Radiation Oncology | Admitting: Radiation Oncology

## 2015-04-19 DIAGNOSIS — Z510 Encounter for antineoplastic radiation therapy: Secondary | ICD-10-CM | POA: Diagnosis not present

## 2015-04-20 ENCOUNTER — Ambulatory Visit
Admission: RE | Admit: 2015-04-20 | Discharge: 2015-04-20 | Disposition: A | Discharge status: Home / Self Care | Payer: BLUE CROSS/BLUE SHIELD | Source: Ambulatory Visit | Attending: Radiation Oncology | Admitting: Radiation Oncology

## 2015-04-20 DIAGNOSIS — Z510 Encounter for antineoplastic radiation therapy: Secondary | ICD-10-CM | POA: Diagnosis not present

## 2015-04-21 ENCOUNTER — Ambulatory Visit
Admission: RE | Admit: 2015-04-21 | Discharge: 2015-04-21 | Disposition: A | Discharge status: Home / Self Care | Payer: BLUE CROSS/BLUE SHIELD | Source: Ambulatory Visit | Attending: Radiation Oncology | Admitting: Radiation Oncology

## 2015-04-21 DIAGNOSIS — Z510 Encounter for antineoplastic radiation therapy: Secondary | ICD-10-CM | POA: Diagnosis not present

## 2015-04-24 ENCOUNTER — Ambulatory Visit
Admission: RE | Admit: 2015-04-24 | Discharge: 2015-04-24 | Disposition: A | Discharge status: Home / Self Care | Payer: BLUE CROSS/BLUE SHIELD | Source: Ambulatory Visit | Attending: Radiation Oncology | Admitting: Radiation Oncology

## 2015-04-24 DIAGNOSIS — Z510 Encounter for antineoplastic radiation therapy: Secondary | ICD-10-CM | POA: Diagnosis not present

## 2015-04-25 ENCOUNTER — Ambulatory Visit
Admission: RE | Admit: 2015-04-25 | Discharge: 2015-04-25 | Disposition: A | Discharge status: Home / Self Care | Payer: BLUE CROSS/BLUE SHIELD | Source: Ambulatory Visit | Attending: Radiation Oncology | Admitting: Radiation Oncology

## 2015-04-25 VITALS — BP 113/75 | HR 69 | Temp 98.1°F | Resp 16 | Ht 64.0 in | Wt 161.5 lb

## 2015-04-25 DIAGNOSIS — C50512 Malignant neoplasm of lower-outer quadrant of left female breast: Secondary | ICD-10-CM

## 2015-04-25 DIAGNOSIS — Z510 Encounter for antineoplastic radiation therapy: Secondary | ICD-10-CM | POA: Diagnosis not present

## 2015-04-25 MED ORDER — RADIAPLEXRX EX GEL
Freq: Once | CUTANEOUS | Status: AC
  Administered 2015-04-25: 16:00:00 via TOPICAL

## 2015-04-25 NOTE — Progress Notes (Signed)
  Radiation Oncology         [phone] ________________________________  Name: Cynthia Ford MRN: [id number]  Date: 04/25/2015  DOB: [birth year]  Weekly Radiation Therapy Management  DIAGNOSIS: Intraductal carcinoma of the left breast  Current Dose: 19.8 Gy     Planned Dose:  62.4 Gy  Narrative . . . . . . . . The patient presents for routine under treatment assessment.                                   The patient is without complaint.                                  Set-up films were reviewed.                                 The chart was checked. Physical Findings. . .  height is 5\' 4"  (1.626 m) and weight is 161 lb 8 oz (73.256 kg). Her oral temperature is 98.1 F (36.7 C). Her blood pressure is 113/75 and her pulse is 69. Her respiration is 16. . The left breast area shows some slight hyperpigmentation changes.  Impression . . . . . . . The patient is tolerating radiation. Plan . . . . . . . . . . . . Continue treatment as planned.  ________________________________   Blair Promise, PhD, MD

## 2015-04-25 NOTE — Progress Notes (Signed)
Cynthia Ford has completed 11 fractions to her left breast.  She denies pain and fatigue.  She did notice some swelling under her left breast yesterday that is gone today.  She reports that she may have a sinus infection. The skin on her left breast has hyperpigmentation.   She is requesting a refill on Radiaplex.  Another tube has been given.  BP 113/75 mmHg  Pulse 69  Temp(Src) 98.1 F (36.7 C) (Oral)  Resp 16  Ht 5\' 4"  (1.626 m)  Wt 161 lb 8 oz (73.256 kg)  BMI 27.71 kg/m2

## 2015-04-26 ENCOUNTER — Ambulatory Visit
Admission: RE | Admit: 2015-04-26 | Discharge: 2015-04-26 | Disposition: A | Discharge status: Home / Self Care | Payer: BLUE CROSS/BLUE SHIELD | Source: Ambulatory Visit | Attending: Radiation Oncology | Admitting: Radiation Oncology

## 2015-04-26 DIAGNOSIS — Z510 Encounter for antineoplastic radiation therapy: Secondary | ICD-10-CM | POA: Diagnosis not present

## 2015-04-27 ENCOUNTER — Ambulatory Visit
Admission: RE | Admit: 2015-04-27 | Discharge: 2015-04-27 | Disposition: A | Discharge status: Home / Self Care | Payer: BLUE CROSS/BLUE SHIELD | Source: Ambulatory Visit | Attending: Radiation Oncology | Admitting: Radiation Oncology

## 2015-04-27 DIAGNOSIS — Z510 Encounter for antineoplastic radiation therapy: Secondary | ICD-10-CM | POA: Diagnosis not present

## 2015-04-28 ENCOUNTER — Ambulatory Visit (HOSPITAL_BASED_OUTPATIENT_CLINIC_OR_DEPARTMENT_OTHER): Payer: BLUE CROSS/BLUE SHIELD | Admitting: Hematology

## 2015-04-28 ENCOUNTER — Other Ambulatory Visit (HOSPITAL_BASED_OUTPATIENT_CLINIC_OR_DEPARTMENT_OTHER): Payer: BLUE CROSS/BLUE SHIELD

## 2015-04-28 ENCOUNTER — Ambulatory Visit
Admission: RE | Admit: 2015-04-28 | Discharge: 2015-04-28 | Disposition: A | Discharge status: Home / Self Care | Payer: BLUE CROSS/BLUE SHIELD | Source: Ambulatory Visit | Attending: Radiation Oncology | Admitting: Radiation Oncology

## 2015-04-28 ENCOUNTER — Telehealth: Payer: Self-pay | Admitting: Hematology

## 2015-04-28 VITALS — BP 114/74 | HR 65 | Temp 98.0°F | Resp 18 | Ht 64.0 in | Wt 160.7 lb

## 2015-04-28 DIAGNOSIS — Z510 Encounter for antineoplastic radiation therapy: Secondary | ICD-10-CM | POA: Diagnosis not present

## 2015-04-28 DIAGNOSIS — C50512 Malignant neoplasm of lower-outer quadrant of left female breast: Secondary | ICD-10-CM

## 2015-04-28 DIAGNOSIS — D0512 Intraductal carcinoma in situ of left breast: Secondary | ICD-10-CM

## 2015-04-28 DIAGNOSIS — Z170 Estrogen receptor positive status [ER+]: Secondary | ICD-10-CM | POA: Diagnosis not present

## 2015-04-28 LAB — COMPREHENSIVE METABOLIC PANEL (CC13)
ALT: 19 U/L (ref 0–55)
AST: 16 U/L (ref 5–34)
Albumin: 3.7 g/dL (ref 3.5–5.0)
Alkaline Phosphatase: 110 U/L (ref 40–150)
Anion Gap: 10 mEq/L (ref 3–11)
BUN: 10.7 mg/dL (ref 7.0–26.0)
CO2: 27 mEq/L (ref 22–29)
Calcium: 9.5 mg/dL (ref 8.4–10.4)
Chloride: 104 mEq/L (ref 98–109)
Creatinine: 0.9 mg/dL (ref 0.6–1.1)
EGFR: 89 mL/min/{1.73_m2} — ABNORMAL LOW (ref >90)
Glucose: 105 mg/dl (ref 70–140)
Potassium: 3.6 mEq/L (ref 3.5–5.1)
Sodium: 141 mEq/L (ref 136–145)
Total Bilirubin: 0.25 mg/dL (ref 0.20–1.20)
Total Protein: 7.5 g/dL (ref 6.4–8.3)

## 2015-04-28 LAB — CBC WITH DIFFERENTIAL/PLATELET
BASO%: 0.8 % (ref 0.0–2.0)
Basophils Absolute: 0.1 10*3/uL (ref 0.0–0.1)
EOS%: 12 % — ABNORMAL HIGH (ref 0.0–7.0)
Eosinophils Absolute: 1 10*3/uL — ABNORMAL HIGH (ref 0.0–0.5)
HCT: 37.9 % (ref 34.8–46.6)
HGB: 12.9 g/dL (ref 11.6–15.9)
LYMPH%: 16.8 % (ref 14.0–49.7)
MCH: 26.5 pg (ref 25.1–34.0)
MCHC: 34.2 g/dL (ref 31.5–36.0)
MCV: 77.6 fL — ABNORMAL LOW (ref 79.5–101.0)
MONO#: 0.7 10*3/uL (ref 0.1–0.9)
MONO%: 7.8 % (ref 0.0–14.0)
NEUT#: 5.3 10*3/uL (ref 1.5–6.5)
NEUT%: 62.6 % (ref 38.4–76.8)
Platelets: 279 10*3/uL (ref 145–400)
RBC: 4.88 10*6/uL (ref 3.70–5.45)
RDW: 13.3 % (ref 11.2–14.5)
WBC: 8.4 10*3/uL (ref 3.9–10.3)
lymph#: 1.4 10*3/uL (ref 0.9–3.3)

## 2015-04-28 NOTE — Telephone Encounter (Signed)
Gave avs & calendar for June. °

## 2015-04-28 NOTE — Progress Notes (Signed)
[name]  [phone] Fax:(336) War Note   Patient Care Team: [name], MD as PCP - General (Family Medicine) 04/28/2015  CHIEF COMPLAINTS/PURPOSE OF CONSULTATION:  Left breast DCIS  Oncology History   Breast cancer of lower-outer quadrant of left female breast   Staging form: Breast, AJCC 7th Edition     Clinical: Stage 0 (Tis (DCIS), N0, M0) - Unsigned       Breast cancer of lower-outer quadrant of left female breast   12/19/2014 Pathology Results Ductal papilloma   12/19/2014 Mammogram 2 new solid masses (52mm, 1mm) in the 6:00 position of the left breast.   02/14/2015 Initial Diagnosis Breast cancer of lower-outer quadrant of left female breast   02/14/2015 Surgery Left breast lumpectomy, negative margins.    02/14/2015 Pathologic Stage Low-grade ductal carcinoma in situ involving intraductal papilloma, tumor 2 mm, ER 100%, PR 100% positive.    HISTORY OF PRESENTING ILLNESS:  Cynthia Ford 54 y.o. female is here because of recently diagnosed left breast DCIS. She presented with her female partner to the clinic today.  She had papilloma in 2010 and had lumpectomy. She has been followed up with annual mammogram. Her mammogram on 12/19/2014 showed a 2 new solid mass in the 6:00 position of the left breast. She underwent biopsy which showed ductal papilloma. She subsequently underwent left breast lumpectomy on 02/14/2015, and the surgical path showed DCIS and intraductal papilloma.  She has recovered very well from surgery. She has minimal discomfort at the surgical site, has good appetite and energy level. No other complaints.  In terms of breast cancer risk profile:  She menarched at early age of 43 and went to menopause at age 72 She had 0 pregnancy.  She never received birth control pills for approximately .  She was  never exposed to fertility medications or hormone replacement therapy.  She has  family history  of Breast cancer in her niece   INTERIM HISTORY: Tedra returns for follow-up. She started breast irradiation on 04/10/2015. She has been tolerated very well, no significant side effects. She feels well overall, no complaints.  MEDICAL HISTORY:  Past Medical History  Diagnosis Date  . Asthma   . Anxiety   . GERD (gastroesophageal reflux disease)   . Anemia   . PONV (postoperative nausea and vomiting)   . Papilloma of left breast     SURGICAL HISTORY: Past Surgical History  Procedure Laterality Date  . Cholecystectomy  2001  . Breast surgery  2010    LEFT  . Nasal passage  2000  . Knee arthroscopy Left 1999/1993  . Nasal septum surgery    . Breast lumpectomy with radioactive seed localization Left 02/14/2015    Procedure: BREAST LUMPECTOMY WITH RADIOACTIVE SEED LOCALIZATION;  Surgeon: Erroll Luna, MD;  Location: [facility];  Service: General;  Laterality: Left;    SOCIAL HISTORY: History   Social History  . Marital Status: Single    Spouse Name: N/A  . Number of Children: 0  . 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 Smoking status: Never Smoker   . Smokeless tobacco: Not on file  . Alcohol Use: No  . Drug Use: No  . Sexual Activity: Yes    Birth Control/ Protection: Post-menopausal   Other Topics Concern  . Not on file   Social History Narrative    FAMILY HISTORY:  Family History  Problem Relation Age of Onset  . Breast cancer  48    niece  . Prostate cancer Brother   . Prostate cancer Brother   . Prostate cancer Maternal Grandfather     ALLERGIES:  has No Known Allergies.  MEDICATIONS:  Current Outpatient Prescriptions  Medication Sig Dispense Refill  . albuterol (PROVENTIL HFA;VENTOLIN HFA) 108 (90 BASE) MCG/ACT inhaler Inhale into the lungs every 6 (six) hours as needed for wheezing or shortness of breath.    . cetirizine (ZYRTEC) 10 MG tablet Take 10 mg by mouth daily.       . fluticasone (FLONASE) 50 MCG/ACT nasal spray Place into both nostrils daily.    . fluticasone (FLOVENT HFA) 110 MCG/ACT inhaler Inhale 1 puff into the lungs 2 (two) times daily.      . hyaluronate sodium (RADIAPLEXRX) GEL Apply 1 application topically once.    . non-metallic deodorant Jethro Poling) MISC Apply 1 application topically daily as needed.    Marland Kitchen omeprazole (PRILOSEC) 20 MG capsule Take 2 capsules (40 mg total) by mouth daily. 30 capsule 0  . oxyCODONE-acetaminophen (ROXICET) 5-325 MG per tablet Take 1 tablet by mouth every 4 (four) hours as needed. 30 tablet 0   No current facility-administered medications for this visit.    REVIEW OF SYSTEMS:   Constitutional: Denies fevers, chills or abnormal night sweats Eyes: Denies blurriness of vision, double vision or watery eyes Ears, nose, mouth, throat, and face: Denies mucositis or sore throat Respiratory: Denies cough, dyspnea or wheezes Cardiovascular: Denies palpitation, chest discomfort or lower extremity swelling Gastrointestinal:  Denies nausea, heartburn or change in bowel habits Skin: Denies abnormal skin rashes Lymphatics: Denies new lymphadenopathy or easy bruising Neurological:Denies numbness, tingling or new weaknesses Behavioral/Psych: Mood is stable, no new changes  All other systems were reviewed with the patient and are negative.  PHYSICAL EXAMINATION: ECOG PERFORMANCE STATUS: 0 - Asymptomatic  Filed Vitals:   04/28/15 1453  BP: 114/74  Pulse: 65  Temp: 98 F (36.7 C)  Resp: 18   Filed Weights   04/28/15 1453  Weight: 160 lb 11.2 oz (72.893 kg)    GENERAL:alert, no distress and comfortable SKIN: skin color, texture, turgor are normal, no rashes or significant lesions EYES: normal, conjunctiva are pink and non-injected, sclera clear OROPHARYNX:no exudate, no erythema and lips, buccal mucosa, and tongue normal  NECK: supple, thyroid normal size, non-tender, without nodularity LYMPH:  no palpable  lymphadenopathy in the cervical, axillary or inguinal LUNGS: clear to auscultation and percussion with normal breathing effort HEART: regular rate & rhythm and no murmurs and no lower extremity edema ABDOMEN:abdomen soft, non-tender and normal bowel sounds Musculoskeletal:no cyanosis of digits and no clubbing  PSYCH: alert & oriented x 3 with fluent speech NEURO: no focal motor/sensory deficits Breasts: Breast inspection showed them to be symmetrical with no nipple discharge. The surgical scar at the left breast inferior to the nipple is healing well, without discharge or skin erythema. Palpation of the breasts and axilla revealed no obvious mass that I could appreciate.  LABORATORY DATA:  I have reviewed the data as listed Lab Results  Component Value Date   WBC 8.4 04/28/2015   HGB 12.9 04/28/2015   HCT 37.9 04/28/2015   MCV 77.6* 04/28/2015   PLT 279 04/28/2015   PATHOLOGY REPORT: Diagnosis 12/19/2014 Breast, left, needle core biopsy, mass, 6 o'clock, slightly outer - DUCTAL PAPILLOMA. - SEE MICROSCOPIC DESCRIPTION.  Diagnosis 02/14/2015 Breast, lumpectomy, left - LOW GRADE DUCTAL CARCINOMA IN  SITU INVOLVING INTRADUCTAL PAPILLOMA, SEE COMMENT. - SURGICAL MARGINS NEGATIVE FOR TUMOR - PREVIOUS BIOPSY SITE IDENTIFIED - SEE TUMOR SYNOPTIC TEMPLATE BELOW.  Specimen, including laterality: Left breast Procedure (include lymph node sampling sentinel-non-sentinel): Lumpectomy without lymph node sampling Grade of carcinoma: I of III Necrosis: Absent Estimated tumor size: 11mm See comment Treatment effect: None If present, treatment effect in breast tissue, lymph nodes or both: N/A Distance to closest margin: 2 mm (anterior) and 1.5 mm (posterior), see comment. If margin positive, focally or broadly: N/A Breast prognostic profile: Pending and will be reported in an addendum. Lymph nodes: Examined: 0 Sentinel 0 Non-sentinel 0 Total Lymph nodes with metastasis: N/A Isolated tumor  cells (< 0.2 mm): N/A Micrometastasis ( > 0.2 mm and < 2.0 mm): N/A Macrometastasis (> 2.0 mm): N/A Extranodal extension: N/A PROGNOSTIC INDICATORS - ACIS TNM: pTis, pNX Results: IMMUNOHISTOCHEMICAL AND MORPHOMETRIC ANALYSIS BY THE AUTOMATED CELLULAR IMAGING SYSTEM (ACIS) Estrogen Receptor: 100%, POSITIVE, STRONG STAINING INTENSITY Progesterone Receptor: 100%, POSITIVE, STRONG STAINING INTENSITY  RADIOGRAPHIC STUDIES: I have personally reviewed the radiological images as listed and agreed with the findings in the report.  Screening mammogram and [place] 12/19/2014 FINDINGS: Spot compression views of the left breast confirm a small, oval, partially obscured mass in the posterior aspect of the 6 o'clock position of the breast. There is also a second mass more posteriorly located, partially included on the images, with similar mammographic features. The visualized margins of both masses are circumscribed. These are new since 12/09/2013.  On physical exam, no mass is palpable in the inferior left breast or left axilla.  Ultrasound is performed, showing a 7 x 6 x 5 mm oval, circumscribed, mixed echogenicity mass in the 6 o'clock position of the left breast, 2 cm from the nipple. This is horizontally oriented and has no internal blood flow with color Doppler.  There is a 6 x 5 x 4 mm oval, mildly lobulated hypoechoic mass in the 6 o'clock position of the left breast, 5 cm from the nipple. This is vertically oriented in the radial plane.  Ultrasound of the left axilla demonstrated normal appearing left axillary lymph nodes.  IMPRESSION: Two new, solid masses in the 6 o'clock position of the left breast, as described above.  ASSESSMENT & PLAN: 54 year old African-American postmenopausal female   1. left breast DCIS and intraductal papilloma s/p lumpectomy, ER/PR positive -The patient had early stage disease. She is considered cured of disease.  Any form of adjuvant treatment is  for prevention of disease recurrence.  She is currently undergoing treatment for adjuvant radiation therapy.  I discussed the role of adjuvant endocrine therapy. She is post and a pulse, I recommend aromatase inhibitor, such as anastrozole, to reduce her risk of breast cancer in the future. Side effects were discussed of his patient, she agrees to proceed. My plan would be to start her on adjuvant Arimidex after she recovers from radiation treatment  We discussed healthy diet and a regular exercise, which promotes overall health and reduce risk of breast cancer  2. Genetic -Her maternal niece had breast cancer at age of 54, 2 brothers has prostate cancer -I'll refer her to genetic counselor for further genetic testing to ruled out inheritable breast cancer  3. Bone health -She never had bone density scan before. I'll obtain one before her next visit  -I recommend her to take calcium and vitamin D  Plan -I'll see her back in mid June, and started her on Arimidex at that time.  All  questions were answered. The patient knows to call the clinic with any problems, questions or concerns. I spent 20 minutes counseling the patient face to face. The total time spent in the appointment was 25 minutes and more than 50% was on counseling.     Truitt Merle, MD 04/28/2015 3:09 PM

## 2015-04-29 ENCOUNTER — Encounter: Payer: Self-pay | Admitting: Hematology

## 2015-05-01 ENCOUNTER — Ambulatory Visit
Admission: RE | Admit: 2015-05-01 | Discharge: 2015-05-01 | Disposition: A | Discharge status: Home / Self Care | Payer: BLUE CROSS/BLUE SHIELD | Source: Ambulatory Visit | Attending: Radiation Oncology | Admitting: Radiation Oncology

## 2015-05-01 DIAGNOSIS — Z510 Encounter for antineoplastic radiation therapy: Secondary | ICD-10-CM | POA: Diagnosis not present

## 2015-05-02 ENCOUNTER — Ambulatory Visit
Admission: RE | Admit: 2015-05-02 | Discharge: 2015-05-02 | Disposition: A | Discharge status: Home / Self Care | Payer: BLUE CROSS/BLUE SHIELD | Source: Ambulatory Visit | Attending: Radiation Oncology | Admitting: Radiation Oncology

## 2015-05-02 DIAGNOSIS — Z510 Encounter for antineoplastic radiation therapy: Secondary | ICD-10-CM | POA: Diagnosis not present

## 2015-05-03 ENCOUNTER — Ambulatory Visit
Admission: RE | Admit: 2015-05-03 | Discharge: 2015-05-03 | Disposition: A | Discharge status: Home / Self Care | Payer: BLUE CROSS/BLUE SHIELD | Source: Ambulatory Visit | Attending: Radiation Oncology | Admitting: Radiation Oncology

## 2015-05-03 DIAGNOSIS — C50512 Malignant neoplasm of lower-outer quadrant of left female breast: Secondary | ICD-10-CM

## 2015-05-03 DIAGNOSIS — Z510 Encounter for antineoplastic radiation therapy: Secondary | ICD-10-CM | POA: Diagnosis not present

## 2015-05-03 NOTE — Progress Notes (Signed)
Weekly Management Note Current Dose: 30.6  Gy  Projected Dose: 50.4 Gy   Narrative:  The patient presents for routine under treatment assessment.  CBCT/MVCT images/Port film x-rays were reviewed.  The chart was checked. Doing well. Working Breast pain has resolved.  Physical Findings: Weight:  . Unchanged  Impression:  The patient is tolerating radiation.   Plan:  Continue treatment as planned. Continue radiaplex.  This document serves as a record of services personally performed by Thea Silversmith, MD. It was created on her behalf by Arlyce Harman, a trained medical scribe. The creation of this record is based on the scribe's personal observations and the provider's statements to them. This document has been checked and approved by the attending provider.

## 2015-05-04 ENCOUNTER — Ambulatory Visit
Admission: RE | Admit: 2015-05-04 | Discharge: 2015-05-04 | Disposition: A | Discharge status: Home / Self Care | Payer: BLUE CROSS/BLUE SHIELD | Source: Ambulatory Visit | Attending: Radiation Oncology | Admitting: Radiation Oncology

## 2015-05-04 DIAGNOSIS — Z510 Encounter for antineoplastic radiation therapy: Secondary | ICD-10-CM | POA: Diagnosis not present

## 2015-05-05 ENCOUNTER — Ambulatory Visit
Admission: RE | Admit: 2015-05-05 | Discharge: 2015-05-05 | Disposition: A | Discharge status: Home / Self Care | Payer: BLUE CROSS/BLUE SHIELD | Source: Ambulatory Visit | Attending: Radiation Oncology | Admitting: Radiation Oncology

## 2015-05-05 DIAGNOSIS — Z510 Encounter for antineoplastic radiation therapy: Secondary | ICD-10-CM | POA: Diagnosis not present

## 2015-05-08 ENCOUNTER — Ambulatory Visit
Admission: RE | Admit: 2015-05-08 | Discharge: 2015-05-08 | Disposition: A | Discharge status: Home / Self Care | Payer: BLUE CROSS/BLUE SHIELD | Source: Ambulatory Visit | Attending: Radiation Oncology | Admitting: Radiation Oncology

## 2015-05-08 DIAGNOSIS — Z510 Encounter for antineoplastic radiation therapy: Secondary | ICD-10-CM | POA: Diagnosis not present

## 2015-05-09 ENCOUNTER — Encounter: Payer: Self-pay | Admitting: Radiation Oncology

## 2015-05-09 ENCOUNTER — Ambulatory Visit
Admission: RE | Admit: 2015-05-09 | Discharge: 2015-05-09 | Disposition: A | Discharge status: Home / Self Care | Payer: BLUE CROSS/BLUE SHIELD | Source: Ambulatory Visit | Attending: Radiation Oncology | Admitting: Radiation Oncology

## 2015-05-09 VITALS — BP 112/83 | HR 72 | Temp 97.8°F | Resp 16 | Ht 64.0 in | Wt 161.0 lb

## 2015-05-09 DIAGNOSIS — Z510 Encounter for antineoplastic radiation therapy: Secondary | ICD-10-CM | POA: Diagnosis not present

## 2015-05-09 DIAGNOSIS — C50512 Malignant neoplasm of lower-outer quadrant of left female breast: Secondary | ICD-10-CM

## 2015-05-09 NOTE — Progress Notes (Signed)
  Radiation Oncology         [phone] ________________________________  Name: Cynthia Ford MRN: [id number]  Date: 05/09/2015  DOB: Apr 30, 1961  Weekly Radiation Therapy Management  DIAGNOSIS: Intraductal carcinoma of the left breast  Current Dose: 37.8 Gy     Planned Dose:  62.4 Gy  Narrative . . . . . . . . The patient presents for routine under treatment assessment.                                   The patient has noted some mild fatigue but continues to work her full schedule. She has noticed some mild swelling along the lateral aspect of the breast. Patient had set up, productive of yellow sputum and will see her primary care physician concerning this issue. She denies any chills or fever. She does have a history of asthma.                                 Set-up films were reviewed.                                 The chart was checked. Physical Findings. . .  height is 5\' 4"  (1.626 m) and weight is 161 lb (73.029 kg). Her oral temperature is 97.8 F (36.6 C). Her blood pressure is 112/83 and her pulse is 72. Her respiration is 16. . Weight essentially stable.  No significant changes. The lungs are clear without wheezing. The heart has a regular rhythm and rate. The left breast area shows some hyperpigmentation changes but no skin breakdown. Impression . . . . . . . The patient is tolerating radiation. Plan . . . . . . . . . . . . Continue treatment as planned.  ________________________________   Blair Promise, PhD, MD

## 2015-05-09 NOTE — Progress Notes (Signed)
Cynthia Ford has completed 21 fractions to her left breast.  She denies pain.  She reports occasional fatigue.  She reports having a productive cough with yellow sputum for about 3 weeks.  She is wondering if it is asthma or bronchitis.  The skin on her left breast has hyperpigmentation.  She has noticed swelling on the side of her left breast.  She is using radiaplex gel.  BP 112/83 mmHg  Pulse 72  Temp(Src) 97.8 F (36.6 C) (Oral)  Resp 16  Ht 5\' 4"  (1.626 m)  Wt 161 lb (73.029 kg)  BMI 27.62 kg/m2

## 2015-05-10 ENCOUNTER — Ambulatory Visit
Admission: RE | Admit: 2015-05-10 | Discharge: 2015-05-10 | Disposition: A | Discharge status: Home / Self Care | Payer: BLUE CROSS/BLUE SHIELD | Source: Ambulatory Visit | Attending: Radiation Oncology | Admitting: Radiation Oncology

## 2015-05-10 DIAGNOSIS — Z510 Encounter for antineoplastic radiation therapy: Secondary | ICD-10-CM | POA: Diagnosis not present

## 2015-05-11 ENCOUNTER — Other Ambulatory Visit: Payer: BLUE CROSS/BLUE SHIELD

## 2015-05-11 ENCOUNTER — Ambulatory Visit (HOSPITAL_BASED_OUTPATIENT_CLINIC_OR_DEPARTMENT_OTHER): Payer: BLUE CROSS/BLUE SHIELD | Admitting: Genetic Counselor

## 2015-05-11 ENCOUNTER — Ambulatory Visit
Admission: RE | Admit: 2015-05-11 | Discharge: 2015-05-11 | Disposition: A | Discharge status: Home / Self Care | Payer: BLUE CROSS/BLUE SHIELD | Source: Ambulatory Visit | Attending: Radiation Oncology | Admitting: Radiation Oncology

## 2015-05-11 DIAGNOSIS — Z315 Encounter for genetic counseling: Secondary | ICD-10-CM

## 2015-05-11 DIAGNOSIS — Z803 Family history of malignant neoplasm of breast: Secondary | ICD-10-CM

## 2015-05-11 DIAGNOSIS — Z8042 Family history of malignant neoplasm of prostate: Secondary | ICD-10-CM | POA: Insufficient documentation

## 2015-05-11 DIAGNOSIS — D0512 Intraductal carcinoma in situ of left breast: Secondary | ICD-10-CM

## 2015-05-11 DIAGNOSIS — Z510 Encounter for antineoplastic radiation therapy: Secondary | ICD-10-CM | POA: Diagnosis not present

## 2015-05-11 DIAGNOSIS — D0510 Intraductal carcinoma in situ of unspecified breast: Secondary | ICD-10-CM | POA: Insufficient documentation

## 2015-05-11 NOTE — Progress Notes (Signed)
[facility] Patient Visit  REFERRING PROVIDER: [name], MD [facility] [address], [place] [address]  PRIMARY PROVIDER:  [name], MD  PRIMARY REASON FOR VISIT:  1. DCIS (ductal carcinoma in situ), left   2. Family history of prostate cancer   3. Family history of malignant neoplasm of breast     HISTORY OF PRESENT ILLNESS:   Cynthia Ford, a 54 y.o. female, was seen for a [facility] cancer genetics consultation at the request of Dr. Dorthy Cooler due to a personal and family history of cancer.  Cynthia Ford presents to clinic today to discuss the possibility of a hereditary predisposition to cancer, genetic testing, and to further clarify her future cancer risks, as well as potential cancer risks for family members.   CANCER HISTORY:  Oncology History   Breast cancer of lower-outer quadrant of left female breast   Staging form: Breast, AJCC 7th Edition     Clinical: Stage 0 (Tis (DCIS), N0, M0) - Unsigned       Breast cancer of lower-outer quadrant of left female breast   12/19/2014 Pathology Results Ductal papilloma   12/19/2014 Mammogram 2 new solid masses (23mm, 24mm) in the 6:00 position of the left breast.   02/14/2015 Initial Diagnosis Breast cancer of lower-outer quadrant of left female breast   02/14/2015 Surgery Left breast lumpectomy, negative margins.    02/14/2015 Pathologic Stage Low-grade ductal carcinoma in situ involving intraductal papilloma, tumor 2 mm, ER 100%, PR 100% positive.   04/10/2015 -  Radiation Therapy adjuvant breast radiation     Past Medical History  Diagnosis Date   Asthma    Anxiety    GERD (gastroesophageal reflux disease)    Anemia    PONV (postoperative nausea and vomiting)    Papilloma of left breast     Past Surgical History  Procedure Laterality Date   Cholecystectomy  2001   Breast surgery  2010    LEFT   Nasal passage  2000   Knee arthroscopy Left 1999/1993    Nasal septum surgery     Breast lumpectomy with radioactive seed localization Left 02/14/2015    Procedure: BREAST LUMPECTOMY WITH RADIOACTIVE SEED LOCALIZATION;  Surgeon: Erroll Luna, MD;  Location: [facility];  Service: General;  Laterality: Left;    History   Social History   Marital Status: Single    Spouse Name: N/A   Number of Children: 0   Years of Education: N/A   Occupational History   Architect    Social History Main Topics   Smoking status: Never Smoker    Smokeless tobacco: Not on file   Alcohol Use: No   Drug Use: No   Sexual Activity: Yes    Patent examiner Protection: Post-menopausal   Other Topics Concern   Not on file   Social History Narrative     FAMILY HISTORY:  During the visit, a 4-generation pedigree was obtained. A copy of the pedigree with be scanned into Epic under the Media tab. Significant family history diagnoses include the following: Family History  Problem Relation Age of Onset   Breast cancer  27    niece   Prostate cancer Brother 26   Prostate cancer Brother 39   Prostate cancer Maternal Grandfather 62.   Cynthia Ford ancestry is of African American descent. There is no known Jewish ancestry or consanguinity.  GENETIC COUNSELING ASSESSMENT:  Cynthia Ford is a 54 y.o. female  with a personal and family history of cancer suggestive of a hereditary predisposition to cancer. We, therefore, discussed and recommended the following at today's visit.   DISCUSSION:  We reviewed the characteristics, features and inheritance patterns of hereditary cancer syndromes. We also discussed genetic testing, including the appropriate family members to test, the process of testing, insurance coverage and turn-around-time for results. We discussed the implications of a negative, positive and/or variant of uncertain significant result. We recommended Cynthia Ford pursue genetic testing for the BreastNext gene panel. The  BreastNext gene panel offered by [employer] includes sequencing and rearrangement analysis for the following 17 genes: ATM, BARD1, BRCA1, BRCA2, BRIP1, CDH1, CHEK2, MRE11A, MUTYH, NBN, NF1, PALB2, PTEN, RAD50, RAD51C, RAD51D, and TP53.  PLAN:  Based on our above recommendation, Cynthia Ford wished to pursue genetic testing and the blood sample was drawn and will be sent to [facility] for analysis. Results should be available within approximately 4 weeks time, at which point they will be disclosed by telephone to Cynthia Ford, as will any additional recommendations warranted by these results. We encouraged Cynthia Ford to remain in contact with cancer genetics annually so that we can continuously update the family history and inform her of any changes in cancer genetics and testing that may be of benefit for this family.   Ms.  Ford questions were answered to her satisfaction today. Our contact information was provided should additional questions or concerns arise. Thank you for the referral and allowing [place] to share in the care of your patient.   Catherine A. Fine, MS, CGC Certified Psychologist, [email or web address] phone: [phone]  The patient was seen for a total of 45 minutes in face-to-face genetic counseling.  This patient was discussed with Dr. Burr Medico who agrees with the above.    ______________________________________________________________________ For Office Staff:  Number of people involved in session including genetic counselor: 3 Was an intern or student involved with case: not applicable

## 2015-05-12 ENCOUNTER — Ambulatory Visit
Admission: RE | Admit: 2015-05-12 | Discharge: 2015-05-12 | Disposition: A | Discharge status: Home / Self Care | Payer: BLUE CROSS/BLUE SHIELD | Source: Ambulatory Visit | Attending: Radiation Oncology | Admitting: Radiation Oncology

## 2015-05-12 DIAGNOSIS — Z510 Encounter for antineoplastic radiation therapy: Secondary | ICD-10-CM | POA: Diagnosis not present

## 2015-05-15 ENCOUNTER — Ambulatory Visit
Admission: RE | Admit: 2015-05-15 | Discharge: 2015-05-15 | Disposition: A | Discharge status: Home / Self Care | Payer: BLUE CROSS/BLUE SHIELD | Source: Ambulatory Visit | Attending: Radiation Oncology | Admitting: Radiation Oncology

## 2015-05-15 ENCOUNTER — Encounter: Payer: Self-pay | Admitting: Radiation Oncology

## 2015-05-15 DIAGNOSIS — Z510 Encounter for antineoplastic radiation therapy: Secondary | ICD-10-CM | POA: Diagnosis not present

## 2015-05-15 NOTE — Progress Notes (Signed)
  Radiation Oncology         [phone] ________________________________  Name: Cynthia Ford MRN: [id number]  Date: 05/15/2015  DOB: 1961/04/12  Electron beam Simulation  Note     NARRATIVE: The patient underwent additional planning for radiation therapy directed at the left breast. The patient's treatment planning CT scan was reviewed and she had set up of a custom electron cutout field directed at the lumpectomy cavity in the upper outer quadrant of the left breast. The patient will be treated with 12 megavoltage electrons prescribed to the 100% isodose line. A electron Monte Carlo plan was generated for treatment.  The patient received 6 additional treatments at 2 gray for a boost dose of 12 gray  -----------------------------------  Blair Promise, PhD, MD

## 2015-05-16 ENCOUNTER — Ambulatory Visit
Admission: RE | Admit: 2015-05-16 | Discharge: 2015-05-16 | Disposition: A | Discharge status: Home / Self Care | Payer: BLUE CROSS/BLUE SHIELD | Source: Ambulatory Visit | Attending: Radiation Oncology | Admitting: Radiation Oncology

## 2015-05-16 ENCOUNTER — Encounter: Payer: Self-pay | Admitting: Radiation Oncology

## 2015-05-16 ENCOUNTER — Ambulatory Visit: Payer: BLUE CROSS/BLUE SHIELD | Admitting: Radiation Oncology

## 2015-05-16 VITALS — BP 121/78 | HR 70 | Temp 98.2°F | Resp 16 | Ht 64.0 in | Wt 161.0 lb

## 2015-05-16 DIAGNOSIS — D0512 Intraductal carcinoma in situ of left breast: Secondary | ICD-10-CM | POA: Diagnosis present

## 2015-05-16 DIAGNOSIS — C50512 Malignant neoplasm of lower-outer quadrant of left female breast: Secondary | ICD-10-CM

## 2015-05-16 DIAGNOSIS — Z510 Encounter for antineoplastic radiation therapy: Secondary | ICD-10-CM | POA: Diagnosis not present

## 2015-05-16 MED ORDER — RADIAPLEXRX EX GEL
Freq: Once | CUTANEOUS | Status: AC
  Administered 2015-05-16: 16:00:00 via TOPICAL

## 2015-05-16 NOTE — Progress Notes (Signed)
Cynthia Ford has completed 26 fractions to her left breast.  She reports occasional burning pain in her left breast.  She reports her cough is a little better.  She finished a z pack yesterday.  The skin on her left breast has hyperpigmentation.  She is using radiaplex and has requested a refill.  Another tube has been given.  BP 121/78 mmHg  Pulse 70  Temp(Src) 98.2 F (36.8 C) (Oral)  Resp 16  Ht 5\' 4"  (1.626 m)  Wt 161 lb (73.029 kg)  BMI 27.62 kg/m2

## 2015-05-16 NOTE — Progress Notes (Signed)
  Radiation Oncology         [phone] ________________________________  Name: Cynthia Ford MRN: [id number]  Date: 05/16/2015  DOB: Jul 18, 1961  Weekly Radiation Therapy Management  DIAGNOSIS: Intraductal carcinoma of the left breast  Current Dose: 46.8 Gy     Planned Dose:  62.4 Gy  Narrative . . . . . . . . The patient presents for routine under treatment assessment.                                   The patient is without complaint.  She completed antibiotic therapy for what her primary care physician termed asthmatic bronchitis. She is placed on Z-Pak as well as prescription cough medicine and Aleve.  She reports occasional discomfort in the left breast, burning type pain                                 Set-up films were reviewed.                                 The chart was checked. Physical Findings. . .  height is 5\' 4"  (1.626 m) and weight is 161 lb (73.029 kg). Her oral temperature is 98.2 F (36.8 C). Her blood pressure is 121/78 and her pulse is 70. Her respiration is 16. . Weight essentially stable.  No significant changes. No wheezing, heart regular rhythm and rate. The left breast area shows hyperpigmentation changes without skin breakdown Impression . . . . . . . The patient is tolerating radiation. Plan . . . . . . . . . . . . Continue treatment as planned.  ________________________________   Blair Promise, PhD, MD

## 2015-05-17 ENCOUNTER — Ambulatory Visit
Admission: RE | Admit: 2015-05-17 | Discharge: 2015-05-17 | Disposition: A | Discharge status: Home / Self Care | Payer: BLUE CROSS/BLUE SHIELD | Source: Ambulatory Visit | Attending: Radiation Oncology | Admitting: Radiation Oncology

## 2015-05-17 DIAGNOSIS — Z510 Encounter for antineoplastic radiation therapy: Secondary | ICD-10-CM | POA: Diagnosis not present

## 2015-05-17 DIAGNOSIS — C50512 Malignant neoplasm of lower-outer quadrant of left female breast: Secondary | ICD-10-CM

## 2015-05-17 NOTE — Progress Notes (Signed)
  Radiation Oncology         [phone] ________________________________  Name: Cynthia Ford MRN: [id number]  Date: 05/17/2015  DOB: 1961/06/05  Electron beam Simulation Verification Note    ICD-9-CM ICD-10-CM   1. Breast cancer of lower-outer quadrant of left female breast 174.5 C50.512       NARRATIVE: The patient was brought to the treatment unit and placed in the planned treatment position. The clinical setup was verified. Then port films were obtained and uploaded to the radiation oncology medical record software.  The treatment beams were carefully compared against the planned radiation fields. The position location and shape of the radiation fields was reviewed. They targeted volume of tissue appears to be appropriately covered by the radiation beams. Organs at risk appear to be excluded as planned.  Based on my personal review, I approved the simulation verification. The patient's treatment will proceed as planned.  -----------------------------------  Blair Promise, PhD, MD

## 2015-05-18 ENCOUNTER — Ambulatory Visit
Admission: RE | Admit: 2015-05-18 | Discharge: 2015-05-18 | Disposition: A | Discharge status: Home / Self Care | Payer: BLUE CROSS/BLUE SHIELD | Source: Ambulatory Visit | Attending: Radiation Oncology | Admitting: Radiation Oncology

## 2015-05-18 DIAGNOSIS — Z510 Encounter for antineoplastic radiation therapy: Secondary | ICD-10-CM | POA: Diagnosis not present

## 2015-05-19 ENCOUNTER — Ambulatory Visit
Admission: RE | Admit: 2015-05-19 | Discharge: 2015-05-19 | Disposition: A | Discharge status: Home / Self Care | Payer: BLUE CROSS/BLUE SHIELD | Source: Ambulatory Visit | Attending: Radiation Oncology | Admitting: Radiation Oncology

## 2015-05-19 DIAGNOSIS — Z510 Encounter for antineoplastic radiation therapy: Secondary | ICD-10-CM | POA: Diagnosis not present

## 2015-05-22 ENCOUNTER — Ambulatory Visit
Admission: RE | Admit: 2015-05-22 | Discharge: 2015-05-22 | Disposition: A | Discharge status: Home / Self Care | Payer: BLUE CROSS/BLUE SHIELD | Source: Ambulatory Visit | Attending: Radiation Oncology | Admitting: Radiation Oncology

## 2015-05-22 DIAGNOSIS — Z510 Encounter for antineoplastic radiation therapy: Secondary | ICD-10-CM | POA: Diagnosis not present

## 2015-05-23 ENCOUNTER — Encounter: Payer: Self-pay | Admitting: Radiation Oncology

## 2015-05-23 ENCOUNTER — Ambulatory Visit
Admission: RE | Admit: 2015-05-23 | Discharge: 2015-05-23 | Disposition: A | Discharge status: Home / Self Care | Payer: BLUE CROSS/BLUE SHIELD | Source: Ambulatory Visit | Attending: Radiation Oncology | Admitting: Radiation Oncology

## 2015-05-23 VITALS — BP 126/89 | HR 70 | Temp 98.1°F | Resp 16 | Ht 64.0 in | Wt 161.0 lb

## 2015-05-23 DIAGNOSIS — C50512 Malignant neoplasm of lower-outer quadrant of left female breast: Secondary | ICD-10-CM

## 2015-05-23 DIAGNOSIS — Z510 Encounter for antineoplastic radiation therapy: Secondary | ICD-10-CM | POA: Diagnosis not present

## 2015-05-23 NOTE — Progress Notes (Signed)
Cynthia Ford has completed 31 fractions to her left breast.  She reports discomfort in her left breast especially around her nipple area.  She denies fatigue.  She has been given a one month follow up appointment card.  The skin on her left breast has hyperpigmentation.  She is using radiaplex.  BP 126/89 mmHg  Pulse 70  Temp(Src) 98.1 F (36.7 C) (Oral)  Resp 16  Ht 5\' 4"  (1.626 m)  Wt 161 lb (73.029 kg)  BMI 27.62 kg/m2  SpO2 100%

## 2015-05-23 NOTE — Progress Notes (Signed)
  Radiation Oncology         (336) [phone] ________________________________  Name: Cynthia Ford MRN: [id number]  Date: 05/23/2015  DOB: [birth year]  Weekly Radiation Therapy Management  Current Dose: 56.4 Gy     Planned Dose:  62.4 Gy  Narrative . . . . . . . . The patient presents for routine under treatment assessment.                                   She reports discomfort in her left breast especially around her nipple area. She denies fatigue.                                 Set-up films were reviewed.                                 The chart was checked. Physical Findings. . .  height is 5\' 4"  (1.626 m) and weight is 161 lb (73.029 kg). Her oral temperature is 98.1 F (36.7 C). Her blood pressure is 126/89 and her pulse is 70. Her respiration is 16 and oxygen saturation is 100%. . Weight essentially stable.  Left breast hyperpigmentation.. No skin breakdown, some left breast swelling Impression . . . . . . . The patient is tolerating radiation. Plan . . . . . . . . . . . . Continue treatment as planned.  ________________________________   Blair Promise, PhD, MD

## 2015-05-24 ENCOUNTER — Ambulatory Visit
Admission: RE | Admit: 2015-05-24 | Discharge: 2015-05-24 | Disposition: A | Discharge status: Home / Self Care | Payer: BLUE CROSS/BLUE SHIELD | Source: Ambulatory Visit | Attending: Radiation Oncology | Admitting: Radiation Oncology

## 2015-05-24 DIAGNOSIS — Z510 Encounter for antineoplastic radiation therapy: Secondary | ICD-10-CM | POA: Diagnosis not present

## 2015-05-25 ENCOUNTER — Ambulatory Visit
Admission: RE | Admit: 2015-05-25 | Discharge: 2015-05-25 | Disposition: A | Discharge status: Home / Self Care | Payer: BLUE CROSS/BLUE SHIELD | Source: Ambulatory Visit | Attending: Radiation Oncology | Admitting: Radiation Oncology

## 2015-05-25 DIAGNOSIS — Z510 Encounter for antineoplastic radiation therapy: Secondary | ICD-10-CM | POA: Diagnosis not present

## 2015-05-26 ENCOUNTER — Encounter: Payer: Self-pay | Admitting: Radiation Oncology

## 2015-05-26 ENCOUNTER — Ambulatory Visit
Admission: RE | Admit: 2015-05-26 | Discharge: 2015-05-26 | Disposition: A | Discharge status: Home / Self Care | Payer: BLUE CROSS/BLUE SHIELD | Source: Ambulatory Visit | Attending: Radiation Oncology | Admitting: Radiation Oncology

## 2015-05-26 DIAGNOSIS — Z510 Encounter for antineoplastic radiation therapy: Secondary | ICD-10-CM | POA: Diagnosis not present

## 2015-05-31 NOTE — Progress Notes (Signed)
°  Radiation Oncology         [phone] ________________________________  Name: Cynthia Ford MRN: [id number]  Date: 05/26/2015  DOB: 13-Jan-1961  End of Treatment Note   ICD-9-CM ICD-10-CM    1. Breast cancer of lower-outer quadrant of left female breast 174.5 C50.512     DIAGNOSIS: Low-grade intraductal carcinoma of the left breast     Indication for treatment:  Breast conservation therapy       Radiation treatment dates:   04/10/2015-05/26/2015  Site/dose:   Left breast 50.4 gray in 28 fractions, the lumpectomy cavity boost, 12 gray for a cumulative dose of 62.4 gray  Beams/energy:   Initial setup was 3-D conformal therapy, breath hold technique, tangential beams,  lumpectomy cavity boost was with a custom electron cutout field using 12 megavoltage electrons, a electron [employer] plan was generated  Narrative: The patient tolerated radiation treatment relatively well.   She experienced some discomfort along the nipple areolar complex area and mild fatigue. No significant skin breakdown at the completion of treatments.  Plan: The patient has completed radiation treatment. The patient will return to radiation oncology clinic for routine followup in one month. I advised them to call or return sooner if they have any questions or concerns related to their recovery or treatment.  -----------------------------------  Blair Promise, PhD, MD

## 2015-06-06 ENCOUNTER — Encounter: Payer: Self-pay | Admitting: Genetic Counselor

## 2015-06-06 DIAGNOSIS — Z803 Family history of malignant neoplasm of breast: Secondary | ICD-10-CM

## 2015-06-06 DIAGNOSIS — Z8042 Family history of malignant neoplasm of prostate: Secondary | ICD-10-CM

## 2015-06-06 DIAGNOSIS — D0512 Intraductal carcinoma in situ of left breast: Secondary | ICD-10-CM

## 2015-06-06 NOTE — Progress Notes (Signed)
[facility] Genetic Test Results   REFERRING PROVIDER: Dr. Burr Medico  PRIMARY PROVIDER:  [name], MD  GENETIC TEST RESULT:  Testing Laboratory: [facility]  Test Ordered: BreastNext gene panel Date of Report: 06/02/2015 Result: Normal, no pathogenic mutations identified Other: Variant of uncertain significance identified called, CDH1, [id number] General Interpretation: Reassuring  HPI: Ms. Lanum was previously seen in the [facility] due to concerns regarding a hereditary predisposition to cancer. Please refer to our prior cancer genetics clinic note for more information regarding Ms. Yam's medical, social and family histories, and our assessment and recommendations, at the time. Ms. Southgate genetic test results and recommendations warranted by these results were recently disclosed to her and are discussed in more detail below.  GENETIC TEST RESULTS: At the time of Ms. Tun's visit, we recommended she pursue genetic testing, which includes sequencing and deletion/duplication analysis of several genes associated with an increased risk for cancer via a gene panel. The BreastNext gene panel offered by [employer] includes sequencing and rearrangement analysis for the following 17 genes: ATM, BARD1, BRCA1, BRCA2, BRIP1, CDH1, CHEK2, MRE11A, MUTYH, NBN, NF1, PALB2, PTEN, RAD50, RAD51C, RAD51D, and TP53. Genetic testing for this gene panel was normal and did not reveal a pathogenic mutation in any of these genes. A copy of the genetic test report will be scanned into Epic under the media tab. Genetic testing did identify a variant of uncertain significance called CDH1, [id number]. At this time, it is unknown if this variant is associated with an increased risk for cancer or if this is a normal finding. With time, we suspect the lab will reclassify this variant and when they do, we will try to re-contact Ms. Perz to discuss the  reclassification further.   We discussed with Ms. Faught that current genetic testing is not perfect, and it is, therefore, possible there may be a pathogenic gene mutation in one of these genes that current testing cannot detect, but that chance is small.  We also discussed, that it is possible that another gene that has not yet been discovered, or that we have not yet tested, is responsible for the cancer diagnoses in her family. It is, therefore, important for Ms. Kras to continue to remain in touch with cancer genetics so that we can continue to offer Ms. Derosa the most up to date genetic testing.   CANCER SCREENING RECOMMENDATIONS: This result is reassuring and indicates that Ms. Hinderliter likely does not have an increased risk for a future cancer due to a mutation in one of these genes. This normal test also suggests that Ms. Rzasa's cancer was most likely not due to an inherited predisposition associated with one of these genes.  Most cancers happen by chance and this negative test suggests that her cancer falls into this category.  We, therefore, recommended she continue to follow the cancer management and screening guidelines provided by her oncology and primary healthcare providers.   RECOMMENDATIONS FOR FAMILY MEMBERS:  While these results are reassuring for Ms. Hougland, we recommended further genetic testing in Ms. Persico's family as such testing might help [place] be even more confident in Ms. Cooler's own negative results. Genetic testing is best initiated in someone in the family who has had breast cancer at the youngest age.  In this family, Ms. Ousley's niece who was diagnosed with breast cancer at age 3, would be the most informative person to test. Please let us know if we can  help facilitate testing and/or a referral. Cancer genetic counselors can also be located, by visiting the website of the [employer] ([employer].se) and Field seismologist for a Oncologist by  zip code.  FOLLOW-UP: Lastly, we discussed with Ms. Pulido that cancer genetics is a rapidly advancing field and it is likely that new genetic tests will be appropriate for her and/or family members in the future. We encouraged her to remain in contact with cancer genetics on an annual basis so we can update her personal and family histories and let her know of advances in cancer genetics that may benefit this family.   Our contact number was provided. Ms. Louvier questions were answered to her satisfaction, and she knows she is welcome to call us at anytime with additional questions or concerns.    Catherine A. Fine, MS, CGC Certified Psychologist, [email or web address] Phone: [phone]

## 2015-06-20 ENCOUNTER — Telehealth: Payer: Self-pay | Admitting: Hematology

## 2015-06-20 ENCOUNTER — Encounter: Payer: Self-pay | Admitting: Hematology

## 2015-06-20 ENCOUNTER — Ambulatory Visit (HOSPITAL_BASED_OUTPATIENT_CLINIC_OR_DEPARTMENT_OTHER): Payer: BLUE CROSS/BLUE SHIELD | Admitting: Hematology

## 2015-06-20 ENCOUNTER — Other Ambulatory Visit (HOSPITAL_BASED_OUTPATIENT_CLINIC_OR_DEPARTMENT_OTHER): Payer: BLUE CROSS/BLUE SHIELD

## 2015-06-20 VITALS — BP 117/68 | HR 74 | Temp 98.3°F | Resp 18 | Ht 64.0 in | Wt 160.1 lb

## 2015-06-20 DIAGNOSIS — C50512 Malignant neoplasm of lower-outer quadrant of left female breast: Secondary | ICD-10-CM

## 2015-06-20 DIAGNOSIS — D0512 Intraductal carcinoma in situ of left breast: Secondary | ICD-10-CM | POA: Diagnosis not present

## 2015-06-20 DIAGNOSIS — Z79811 Long term (current) use of aromatase inhibitors: Secondary | ICD-10-CM | POA: Diagnosis not present

## 2015-06-20 DIAGNOSIS — Z780 Asymptomatic menopausal state: Secondary | ICD-10-CM | POA: Diagnosis not present

## 2015-06-20 LAB — CBC WITH DIFFERENTIAL/PLATELET
BASO%: 1 % (ref 0.0–2.0)
Basophils Absolute: 0.1 10*3/uL (ref 0.0–0.1)
EOS%: 9.2 % — ABNORMAL HIGH (ref 0.0–7.0)
Eosinophils Absolute: 0.5 10*3/uL (ref 0.0–0.5)
HCT: 36.5 % (ref 34.8–46.6)
HGB: 12.5 g/dL (ref 11.6–15.9)
LYMPH%: 14.2 % (ref 14.0–49.7)
MCH: 27 pg (ref 25.1–34.0)
MCHC: 34.3 g/dL (ref 31.5–36.0)
MCV: 78.7 fL — ABNORMAL LOW (ref 79.5–101.0)
MONO#: 0.7 10*3/uL (ref 0.1–0.9)
MONO%: 11.9 % (ref 0.0–14.0)
NEUT#: 3.8 10*3/uL (ref 1.5–6.5)
NEUT%: 63.7 % (ref 38.4–76.8)
Platelets: 267 10*3/uL (ref 145–400)
RBC: 4.64 10*6/uL (ref 3.70–5.45)
RDW: 13.9 % (ref 11.2–14.5)
WBC: 6 10*3/uL (ref 3.9–10.3)
lymph#: 0.8 10*3/uL — ABNORMAL LOW (ref 0.9–3.3)

## 2015-06-20 LAB — COMPREHENSIVE METABOLIC PANEL (CC13)
ALT: 19 U/L (ref 0–55)
AST: 18 U/L (ref 5–34)
Albumin: 3.6 g/dL (ref 3.5–5.0)
Alkaline Phosphatase: 94 U/L (ref 40–150)
Anion Gap: 5 mEq/L (ref 3–11)
BUN: 7.9 mg/dL (ref 7.0–26.0)
CO2: 30 mEq/L — ABNORMAL HIGH (ref 22–29)
Calcium: 9.4 mg/dL (ref 8.4–10.4)
Chloride: 105 mEq/L (ref 98–109)
Creatinine: 0.9 mg/dL (ref 0.6–1.1)
EGFR: 89 mL/min/{1.73_m2} — ABNORMAL LOW (ref >90)
Glucose: 68 mg/dl — ABNORMAL LOW (ref 70–140)
Potassium: 3.3 mEq/L — ABNORMAL LOW (ref 3.5–5.1)
Sodium: 140 mEq/L (ref 136–145)
Total Bilirubin: 0.34 mg/dL (ref 0.20–1.20)
Total Protein: 7.1 g/dL (ref 6.4–8.3)

## 2015-06-20 MED ORDER — ANASTROZOLE 1 MG PO TABS: 1.0000 mg | ORAL_TABLET | Freq: Every day | ORAL | Status: DC

## 2015-06-20 NOTE — Telephone Encounter (Signed)
Gave and printed appt sched and avs for pt for Sept °

## 2015-06-20 NOTE — Progress Notes (Signed)
[place]  [phone] Fax:([phone]) [place] Note   Patient Care Team: [name], MD as PCP - General (Family Medicine) 06/20/2015  CHIEF COMPLAINTS/PURPOSE OF CONSULTATION:  Left breast DCIS  Oncology History   Breast cancer of lower-outer quadrant of left female breast   Staging form: Breast, AJCC 7th Edition     Clinical: Stage 0 (Tis (DCIS), N0, M0) - Unsigned       Breast cancer of lower-outer quadrant of left female breast   12/19/2014 Pathology Results Ductal papilloma   12/19/2014 Mammogram 2 new solid masses (52mm, 19mm) in the 6:00 position of the left breast.   02/14/2015 Initial Diagnosis Breast cancer of lower-outer quadrant of left female breast   02/14/2015 Surgery Left breast lumpectomy, negative margins.    02/14/2015 Pathologic Stage Low-grade ductal carcinoma in situ involving intraductal papilloma, tumor 2 mm, ER 100%, PR 100% positive.   04/10/2015 - 05/26/2015 Radiation Therapy adjuvant breast radiation     HISTORY OF PRESENTING ILLNESS:  Cynthia Ford 54 y.o. female is here because of recently diagnosed left breast DCIS. She presented with her female partner to the clinic today.  She had papilloma in 2010 and had lumpectomy. She has been followed up with annual mammogram. Her mammogram on 12/19/2014 showed a 2 new solid mass in the 6:00 position of the left breast. She underwent biopsy which showed ductal papilloma. She subsequently underwent left breast lumpectomy on 02/14/2015, and the surgical path showed DCIS and intraductal papilloma.  She has recovered very well from surgery. She has minimal discomfort at the surgical site, has good appetite and energy level. No other complaints.  In terms of breast cancer risk profile:  She menarched at early age of 20 and went to menopause at age 46 She had 0 pregnancy.  She never received birth control pills for approximately .  She was  never exposed to fertility  medications or hormone replacement therapy.  She has  family history of Breast cancer in her niece   INTERIM HISTORY: Cynthia Ford returns for follow-up. She completed breast radiation on May 27, and overall tolerated well. She did have breast tenderness, which has most resolved now. She had moderate fatigue after radiation, and her energy level has improved this week. She otherwise is eating well, no other complaints.  MEDICAL HISTORY:  Past Medical History  Diagnosis Date  . Asthma   . Anxiety   . GERD (gastroesophageal reflux disease)   . Anemia   . PONV (postoperative nausea and vomiting)   . Papilloma of left breast     SURGICAL HISTORY: Past Surgical History  Procedure Laterality Date  . Cholecystectomy  2001  . Breast surgery  2010    LEFT  . Nasal passage  2000  . Knee arthroscopy Left 1999/1993  . Nasal septum surgery    . Breast lumpectomy with radioactive seed localization Left 02/14/2015    Procedure: BREAST LUMPECTOMY WITH RADIOACTIVE SEED LOCALIZATION;  Surgeon: Erroll Luna, MD;  Location: [facility];  Service: General;  Laterality: Left;    SOCIAL HISTORY: History   Social History  . Marital Status: Single    Spouse Name: N/A  . Number of Children: 0  . 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 Smoking status: Never Smoker   . Smokeless tobacco: Not on file  . Alcohol Use: No  . Drug Use: No  .  Sexual Activity: Yes    Birth Control/ Protection: Post-menopausal   Other Topics Concern  . Not on file   Social History Narrative    FAMILY HISTORY: Family History  Problem Relation Age of Onset  . Breast cancer  8    niece  . Prostate cancer Brother 75  . Prostate cancer Brother 80  . Prostate cancer Maternal Grandfather 52.    ALLERGIES:  has No Known Allergies.  MEDICATIONS:  Current Outpatient Prescriptions  Medication Sig Dispense Refill  . albuterol (PROVENTIL  HFA;VENTOLIN HFA) 108 (90 BASE) MCG/ACT inhaler Inhale into the lungs every 6 (six) hours as needed for wheezing or shortness of breath.    . cetirizine (ZYRTEC) 10 MG tablet Take 10 mg by mouth daily.      . fexofenadine (ALLEGRA) 180 MG tablet Take 180 mg by mouth daily.    . fluticasone (FLONASE) 50 MCG/ACT nasal spray Place into both nostrils daily.    . fluticasone (FLOVENT HFA) 110 MCG/ACT inhaler Inhale 1 puff into the lungs 2 (two) times daily.      . hyaluronate sodium (RADIAPLEXRX) GEL Apply 1 application topically once.    . non-metallic deodorant Jethro Poling) MISC Apply 1 application topically daily as needed.    Marland Kitchen omeprazole (PRILOSEC) 20 MG capsule Take 2 capsules (40 mg total) by mouth daily. 30 capsule 0  . anastrozole (ARIMIDEX) 1 MG tablet Take 1 tablet (1 mg total) by mouth daily. 30 tablet 2   No current facility-administered medications for this visit.    REVIEW OF SYSTEMS:   Constitutional: Denies fevers, chills or abnormal night sweats Eyes: Denies blurriness of vision, double vision or watery eyes Ears, nose, mouth, throat, and face: Denies mucositis or sore throat Respiratory: Denies cough, dyspnea or wheezes Cardiovascular: Denies palpitation, chest discomfort or lower extremity swelling Gastrointestinal:  Denies nausea, heartburn or change in bowel habits Skin: Denies abnormal skin rashes Lymphatics: Denies new lymphadenopathy or easy bruising Neurological:Denies numbness, tingling or new weaknesses Behavioral/Psych: Mood is stable, no new changes  All other systems were reviewed with the patient and are negative.  PHYSICAL EXAMINATION: ECOG PERFORMANCE STATUS: 0 - Asymptomatic  Filed Vitals:   06/20/15 1434  BP: 117/68  Pulse: 74  Temp: 98.3 F (36.8 C)  Resp: 18   Filed Weights   06/20/15 1434  Weight: 160 lb 1.6 oz (72.621 kg)    GENERAL:alert, no distress and comfortable SKIN: skin color, texture, turgor are normal, no rashes or significant  lesions EYES: normal, conjunctiva are pink and non-injected, sclera clear OROPHARYNX:no exudate, no erythema and lips, buccal mucosa, and tongue normal  NECK: supple, thyroid normal size, non-tender, without nodularity LYMPH:  no palpable lymphadenopathy in the cervical, axillary or inguinal LUNGS: clear to auscultation and percussion with normal breathing effort HEART: regular rate & rhythm and no murmurs and no lower extremity edema ABDOMEN:abdomen soft, non-tender and normal bowel sounds Musculoskeletal:no cyanosis of digits and no clubbing  PSYCH: alert & oriented x 3 with fluent speech NEURO: no focal motor/sensory deficits Breasts: Breast inspection showed them to be symmetrical with no nipple discharge. The surgical scar at the left breast inferior to the nipple is healing well, without discharge or skin erythema. Palpation of the breasts and axilla revealed no obvious mass that I could appreciate.  LABORATORY DATA:  I have reviewed the data as listed Lab Results  Component Value Date   WBC 6.0 06/20/2015   HGB 12.5 06/20/2015   HCT 36.5 06/20/2015   MCV  78.7* 06/20/2015   PLT 267 06/20/2015   PATHOLOGY REPORT: Diagnosis 12/19/2014 Breast, left, needle core biopsy, mass, 6 o'clock, slightly outer - DUCTAL PAPILLOMA. - SEE MICROSCOPIC DESCRIPTION.  Diagnosis 02/14/2015 Breast, lumpectomy, left - LOW GRADE DUCTAL CARCINOMA IN SITU INVOLVING INTRADUCTAL PAPILLOMA, SEE COMMENT. - SURGICAL MARGINS NEGATIVE FOR TUMOR - PREVIOUS BIOPSY SITE IDENTIFIED - SEE TUMOR SYNOPTIC TEMPLATE BELOW.  Specimen, including laterality: Left breast Procedure (include lymph node sampling sentinel-non-sentinel): Lumpectomy without lymph node sampling Grade of carcinoma: I of III Necrosis: Absent Estimated tumor size: 39mm See comment Treatment effect: None If present, treatment effect in breast tissue, lymph nodes or both: N/A Distance to closest margin: 2 mm (anterior) and 1.5 mm (posterior),  see comment. If margin positive, focally or broadly: N/A Breast prognostic profile: Pending and will be reported in an addendum. Lymph nodes: Examined: 0 Sentinel 0 Non-sentinel 0 Total Lymph nodes with metastasis: N/A Isolated tumor cells (< 0.2 mm): N/A Micrometastasis ( > 0.2 mm and < 2.0 mm): N/A Macrometastasis (> 2.0 mm): N/A Extranodal extension: N/A PROGNOSTIC INDICATORS - ACIS TNM: pTis, pNX Results: IMMUNOHISTOCHEMICAL AND MORPHOMETRIC ANALYSIS BY THE AUTOMATED CELLULAR IMAGING SYSTEM (ACIS) Estrogen Receptor: 100%, POSITIVE, STRONG STAINING INTENSITY Progesterone Receptor: 100%, POSITIVE, STRONG STAINING INTENSITY  RADIOGRAPHIC STUDIES: I have personally reviewed the radiological images as listed and agreed with the findings in the report.  Screening mammogram and [place] 12/19/2014 FINDINGS: Spot compression views of the left breast confirm a small, oval, partially obscured mass in the posterior aspect of the 6 o'clock position of the breast. There is also a second mass more posteriorly located, partially included on the images, with similar mammographic features. The visualized margins of both masses are circumscribed. These are new since 12/09/2013.  On physical exam, no mass is palpable in the inferior left breast or left axilla.  Ultrasound is performed, showing a 7 x 6 x 5 mm oval, circumscribed, mixed echogenicity mass in the 6 o'clock position of the left breast, 2 cm from the nipple. This is horizontally oriented and has no internal blood flow with color Doppler.  There is a 6 x 5 x 4 mm oval, mildly lobulated hypoechoic mass in the 6 o'clock position of the left breast, 5 cm from the nipple. This is vertically oriented in the radial plane.  Ultrasound of the left axilla demonstrated normal appearing left axillary lymph nodes.  IMPRESSION: Two new, solid masses in the 6 o'clock position of the left breast, as described above.  ASSESSMENT & PLAN:  54 year old African-American postmenopausal female   1. left breast DCIS and intraductal papilloma s/p lumpectomy, ER/PR positive -The patient had early stage disease. She is considered cured of disease.  Any form of adjuvant treatment is for prevention of disease recurrence.  I discussed the role of adjuvant endocrine therapy. She is postmenopausal and I recommend aromatase inhibitor, such as anastrozole, to reduce her risk of breast cancer in the future. Side effects were discussed of his patient, she agrees to proceed. -We'll start her on anastrozole next week , a total 5 years. Prescription was sent to her pharmacy.  -We discussed breast cancer surveillance, she will have annual mammogram.  We discussed healthy diet and a regular exercise, which promotes overall health and reduce risk of breast cancer  2. Genetic -Her maternal niece had breast cancer at age of 85, 2 brothers has prostate cancer -Her genetic test was negative, except unknown significance variant in [facility] 1 gene.   3. Bone health -She never  had bone density scan before. I'll obtain one before her next visit  -I recommend her to take calcium and vitamin D  Plan -Start anastrozole next week  -I'll see her back in 5-6 weeks  -She will see survivorship in 4 months, will refer her next time   All questions were answered. The patient knows to call the clinic with any problems, questions or concerns. I spent 20 minutes counseling the patient face to face. The total time spent in the appointment was 25 minutes and more than 50% was on counseling.     Truitt Merle, MD 06/20/2015 3:27 PM

## 2015-07-06 ENCOUNTER — Ambulatory Visit
Admission: RE | Admit: 2015-07-06 | Discharge: 2015-07-06 | Disposition: A | Discharge status: Home / Self Care | Payer: BLUE CROSS/BLUE SHIELD | Source: Ambulatory Visit | Attending: Radiation Oncology | Admitting: Radiation Oncology

## 2015-07-06 ENCOUNTER — Encounter: Payer: Self-pay | Admitting: Radiation Oncology

## 2015-07-06 ENCOUNTER — Telehealth: Payer: Self-pay | Admitting: Oncology

## 2015-07-06 VITALS — BP 118/74 | HR 65 | Temp 98.4°F | Resp 16 | Ht 64.0 in | Wt 161.2 lb

## 2015-07-06 DIAGNOSIS — C50512 Malignant neoplasm of lower-outer quadrant of left female breast: Secondary | ICD-10-CM

## 2015-07-06 NOTE — Progress Notes (Signed)
Cynthia Ford here for follow up.  She reports pain in her left breast if she rolls over on it at night.  She started taking Arimidex last week.  She has noticed a dry mouth from it.  She denies fatigue.  The skin on her left breast has hyperpigmentation.    BP 118/74 mmHg  Pulse 65  Temp(Src) 98.4 F (36.9 C) (Oral)  Resp 16  Ht 5\' 4"  (1.626 m)  Wt 161 lb 3.2 oz (73.12 kg)  BMI 27.66 kg/m2

## 2015-07-06 NOTE — Telephone Encounter (Signed)
Left a message for Cynthia Ford regarding her follow up appointment with Dr. Sondra Come today.  Requested a return call.

## 2015-07-06 NOTE — Progress Notes (Signed)
Radiation Oncology         [phone] ________________________________  Name: Cynthia Ford MRN: [id number]  Date: 07/06/2015  DOB: [birth year]  Follow-Up Visit Note  CC: [name], MD  Truitt Merle, MD    ICD-9-CM ICD-10-CM   1. Breast cancer of lower-outer quadrant of left female breast 174.5 C50.512     Diagnosis:   Intraductal carcinoma of the left breast  Interval Since Last Radiation:  1  months  Narrative:  The patient returns today for routine follow-up. She reports pain in her left breast if she rolls over on it at night. She started taking Arimidex last week. She has noticed a dry mouth from it. She denies fatigue. The skin on her left breast has hyperpigmentation.                                 ALLERGIES:  has No Known Allergies.  Meds: Current Outpatient Prescriptions  Medication Sig Dispense Refill  . albuterol (PROVENTIL HFA;VENTOLIN HFA) 108 (90 BASE) MCG/ACT inhaler Inhale into the lungs every 6 (six) hours as needed for wheezing or shortness of breath.    . anastrozole (ARIMIDEX) 1 MG tablet Take 1 tablet (1 mg total) by mouth daily. 30 tablet 2  . cetirizine (ZYRTEC) 10 MG tablet Take 10 mg by mouth daily.      . fexofenadine (ALLEGRA) 180 MG tablet Take 180 mg by mouth daily.    . fluticasone (FLONASE) 50 MCG/ACT nasal spray Place into both nostrils daily.    . fluticasone (FLOVENT HFA) 110 MCG/ACT inhaler Inhale 1 puff into the lungs 2 (two) times daily.      . hyaluronate sodium (RADIAPLEXRX) GEL Apply 1 application topically once.    Marland Kitchen omeprazole (PRILOSEC) 20 MG capsule Take 2 capsules (40 mg total) by mouth daily. 30 capsule 0  . non-metallic deodorant (ALRA) MISC Apply 1 application topically daily as needed.     No current facility-administered medications for this encounter.    Physical Findings: The patient is in no acute distress. Patient is alert and oriented.  height is 5\' 4"  (1.626 m) and weight is 161 lb 3.2 oz (73.12 kg). Her oral  temperature is 98.4 F (36.9 C). Her blood pressure is 118/74 and her pulse is 65. Her respiration is 16. .  No significant changes. No supraclavicular or axillary adenopathy. Heart has regular rhythm and rate. Lungs are clear upon auscultation. Abdomen is soft and non tender with normal bowel sounds.  Mild swelling in the left breast. She has hyperpigmentation changes. Lumpectomy scar well healed and no sign of recurrence. Right breast shows no palpable mass or nipple discharge.   Lab Findings: Lab Results  Component Value Date   WBC 6.0 06/20/2015   HGB 12.5 06/20/2015   HCT 36.5 06/20/2015   MCV 78.7* 06/20/2015   PLT 267 06/20/2015    Radiographic Findings: No results found.  Impression:  The patient is recovering from the effects of radiation.  No signs of recurrence on clinical exam today.  Plan:  Routine follow up scheduled in 6 months. She will follow up with medical oncology and her surgeon in interim.  This document serves as a record of services personally performed by Gery Pray, MD. It was created on his behalf by Arlyce Harman, a trained medical scribe. The creation of this record is based on the scribe's personal observations and the provider's statements to them. This document  has been checked and approved by the attending provider. -----------------------------------   Blair Promise, PhD, MD

## 2015-08-01 ENCOUNTER — Ambulatory Visit (HOSPITAL_BASED_OUTPATIENT_CLINIC_OR_DEPARTMENT_OTHER): Payer: BLUE CROSS/BLUE SHIELD | Admitting: Hematology

## 2015-08-01 ENCOUNTER — Encounter: Payer: Self-pay | Admitting: Hematology

## 2015-08-01 ENCOUNTER — Other Ambulatory Visit (HOSPITAL_BASED_OUTPATIENT_CLINIC_OR_DEPARTMENT_OTHER): Payer: BLUE CROSS/BLUE SHIELD

## 2015-08-01 ENCOUNTER — Telehealth: Payer: Self-pay | Admitting: Hematology

## 2015-08-01 VITALS — BP 126/83 | HR 90 | Temp 98.3°F | Resp 17 | Ht 64.0 in | Wt 157.4 lb

## 2015-08-01 DIAGNOSIS — Z170 Estrogen receptor positive status [ER+]: Secondary | ICD-10-CM

## 2015-08-01 DIAGNOSIS — F419 Anxiety disorder, unspecified: Secondary | ICD-10-CM

## 2015-08-01 DIAGNOSIS — D0512 Intraductal carcinoma in situ of left breast: Secondary | ICD-10-CM

## 2015-08-01 DIAGNOSIS — Z79811 Long term (current) use of aromatase inhibitors: Secondary | ICD-10-CM | POA: Diagnosis not present

## 2015-08-01 DIAGNOSIS — C50512 Malignant neoplasm of lower-outer quadrant of left female breast: Secondary | ICD-10-CM

## 2015-08-01 DIAGNOSIS — Z780 Asymptomatic menopausal state: Secondary | ICD-10-CM | POA: Diagnosis not present

## 2015-08-01 LAB — COMPREHENSIVE METABOLIC PANEL (CC13)
ALT: 15 U/L (ref 0–55)
AST: 15 U/L (ref 5–34)
Albumin: 3.9 g/dL (ref 3.5–5.0)
Alkaline Phosphatase: 108 U/L (ref 40–150)
Anion Gap: 6 mEq/L (ref 3–11)
BUN: 8.8 mg/dL (ref 7.0–26.0)
CO2: 29 mEq/L (ref 22–29)
Calcium: 9.5 mg/dL (ref 8.4–10.4)
Chloride: 103 mEq/L (ref 98–109)
Creatinine: 0.8 mg/dL (ref 0.6–1.1)
EGFR: >90 mL/min/{1.73_m2} (ref >90)
Glucose: 99 mg/dl (ref 70–140)
Potassium: 4 mEq/L (ref 3.5–5.1)
Sodium: 139 mEq/L (ref 136–145)
Total Bilirubin: 0.3 mg/dL (ref 0.20–1.20)
Total Protein: 7.6 g/dL (ref 6.4–8.3)

## 2015-08-01 LAB — CBC WITH DIFFERENTIAL/PLATELET
BASO%: 0.7 % (ref 0.0–2.0)
Basophils Absolute: 0.1 10*3/uL (ref 0.0–0.1)
EOS%: 10.1 % — ABNORMAL HIGH (ref 0.0–7.0)
Eosinophils Absolute: 1 10*3/uL — ABNORMAL HIGH (ref 0.0–0.5)
HCT: 39 % (ref 34.8–46.6)
HGB: 13.2 g/dL (ref 11.6–15.9)
LYMPH%: 9.8 % — ABNORMAL LOW (ref 14.0–49.7)
MCH: 26.7 pg (ref 25.1–34.0)
MCHC: 33.9 g/dL (ref 31.5–36.0)
MCV: 78.7 fL — ABNORMAL LOW (ref 79.5–101.0)
MONO#: 1 10*3/uL — ABNORMAL HIGH (ref 0.1–0.9)
MONO%: 9.5 % (ref 0.0–14.0)
NEUT#: 7.2 10*3/uL — ABNORMAL HIGH (ref 1.5–6.5)
NEUT%: 69.9 % (ref 38.4–76.8)
Platelets: 314 10*3/uL (ref 145–400)
RBC: 4.96 10*6/uL (ref 3.70–5.45)
RDW: 13.5 % (ref 11.2–14.5)
WBC: 10.3 10*3/uL (ref 3.9–10.3)
lymph#: 1 10*3/uL (ref 0.9–3.3)

## 2015-08-01 MED ORDER — ANASTROZOLE 1 MG PO TABS: 1.0000 mg | ORAL_TABLET | Freq: Every day | ORAL | Status: DC

## 2015-08-01 NOTE — Telephone Encounter (Signed)
per pof to sch tp appt-sch pt DEXA & mamma-gave pt copy of avs

## 2015-08-01 NOTE — Progress Notes (Signed)
[place]  [phone] Fax:([phone]) [place] Note   Patient Care Team: [name], MD as PCP - General (Family Medicine) 08/01/2015  CHIEF COMPLAINTS/PURPOSE OF CONSULTATION:  Left breast DCIS  Oncology History   Breast cancer of lower-outer quadrant of left female breast   Staging form: Breast, AJCC 7th Edition     Clinical: Stage 0 (Tis (DCIS), N0, M0) - Unsigned       Breast cancer of lower-outer quadrant of left female breast   12/19/2014 Pathology Results Ductal papilloma   12/19/2014 Mammogram 2 new solid masses (32mm, 26mm) in the 6:00 position of the left breast.   02/14/2015 Initial Diagnosis Breast cancer of lower-outer quadrant of left female breast   02/14/2015 Surgery Left breast lumpectomy, negative margins.    02/14/2015 Pathologic Stage Low-grade ductal carcinoma in situ involving intraductal papilloma, tumor 2 mm, ER 100%, PR 100% positive.   04/10/2015 - 05/26/2015 Radiation Therapy adjuvant breast radiation     Anti-estrogen oral therapy     HISTORY OF PRESENTING ILLNESS:  Cynthia Ford 54 y.o. female is here because of recently diagnosed left breast DCIS. She presented with her female partner to the clinic today.  She had papilloma in 2010 and had lumpectomy. She has been followed up with annual mammogram. Her mammogram on 12/19/2014 showed a 2 new solid mass in the 6:00 position of the left breast. She underwent biopsy which showed ductal papilloma. She subsequently underwent left breast lumpectomy on 02/14/2015, and the surgical path showed DCIS and intraductal papilloma.  She has recovered very well from surgery. She has minimal discomfort at the surgical site, has good appetite and energy level. No other complaints.  In terms of breast cancer risk profile:  She menarched at early age of 43 and went to menopause at age 9 She had 0 pregnancy.  She never received birth control pills for approximately .  She  was  never exposed to fertility medications or hormone replacement therapy.  She has  family history of Breast cancer in her niece   INTERIM HISTORY: Cynthia Ford returns for follow-up. She has started anastrazole about 5 weeks ago, and is tolerating well overall. She does notice dry mouth, and slightly worse hot flush, otherwise doiong well. She has mild arthritis, does not take any medication. She has good appetite and no other new complaints. energy level,   MEDICAL HISTORY:  Past Medical History  Diagnosis Date  . Asthma   . Anxiety   . GERD (gastroesophageal reflux disease)   . Anemia   . PONV (postoperative nausea and vomiting)   . Papilloma of left breast     SURGICAL HISTORY: Past Surgical History  Procedure Laterality Date  . Cholecystectomy  2001  . Breast surgery  2010    LEFT  . Nasal passage  2000  . Knee arthroscopy Left 1999/1993  . Nasal septum surgery    . Breast lumpectomy with radioactive seed localization Left 02/14/2015    Procedure: BREAST LUMPECTOMY WITH RADIOACTIVE SEED LOCALIZATION;  Surgeon: Erroll Luna, MD;  Location: [facility];  Service: General;  Laterality: Left;    SOCIAL HISTORY: History   Social History  . Marital Status: Single    Spouse Name: N/A  . Number of Children: 0  . 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 Smoking status: Never Smoker   . Smokeless tobacco: Not on  file  . Alcohol Use: No  . Drug Use: No  . Sexual Activity: Yes    Birth Control/ Protection: Post-menopausal   Other Topics Concern  . Not on file   Social History Narrative    FAMILY HISTORY: Family History  Problem Relation Age of Onset  . Breast cancer  66    niece  . Prostate cancer Brother 37  . Prostate cancer Brother 28  . Prostate cancer Maternal Grandfather 43.    ALLERGIES:  has No Known Allergies.  MEDICATIONS:  Current Outpatient Prescriptions  Medication Sig  Dispense Refill  . albuterol (PROVENTIL HFA;VENTOLIN HFA) 108 (90 BASE) MCG/ACT inhaler Inhale into the lungs every 6 (six) hours as needed for wheezing or shortness of breath.    . anastrozole (ARIMIDEX) 1 MG tablet Take 1 tablet (1 mg total) by mouth daily. 90 tablet 1  . cetirizine (ZYRTEC) 10 MG tablet Take 10 mg by mouth daily.      . fexofenadine (ALLEGRA) 180 MG tablet Take 180 mg by mouth daily.    . fluticasone (FLONASE) 50 MCG/ACT nasal spray Place into both nostrils daily.    . fluticasone (FLOVENT HFA) 110 MCG/ACT inhaler Inhale 1 puff into the lungs 2 (two) times daily.      . hyaluronate sodium (RADIAPLEXRX) GEL Apply 1 application topically once.    . non-metallic deodorant Jethro Poling) MISC Apply 1 application topically daily as needed.    Marland Kitchen omeprazole (PRILOSEC) 20 MG capsule Take 2 capsules (40 mg total) by mouth daily. 30 capsule 0   No current facility-administered medications for this visit.    REVIEW OF SYSTEMS:   Constitutional: Denies fevers, chills or abnormal night sweats Eyes: Denies blurriness of vision, double vision or watery eyes Ears, nose, mouth, throat, and face: Denies mucositis or sore throat Respiratory: Denies cough, dyspnea or wheezes Cardiovascular: Denies palpitation, chest discomfort or lower extremity swelling Gastrointestinal:  Denies nausea, heartburn or change in bowel habits Skin: Denies abnormal skin rashes Lymphatics: Denies new lymphadenopathy or easy bruising Neurological:Denies numbness, tingling or new weaknesses Behavioral/Psych: Mood is stable, no new changes  All other systems were reviewed with the patient and are negative.  PHYSICAL EXAMINATION: ECOG PERFORMANCE STATUS: 0 - Asymptomatic  Filed Vitals:   08/01/15 1525  BP: 126/83  Pulse: 90  Temp: 98.3 F (36.8 C)  Resp: 17   Filed Weights   08/01/15 1525  Weight: 157 lb 6.4 oz (71.396 kg)    GENERAL:alert, no distress and comfortable SKIN: skin color, texture, turgor are  normal, no rashes or significant lesions EYES: normal, conjunctiva are pink and non-injected, sclera clear OROPHARYNX:no exudate, no erythema and lips, buccal mucosa, and tongue normal  NECK: supple, thyroid normal size, non-tender, without nodularity LYMPH:  no palpable lymphadenopathy in the cervical, axillary or inguinal LUNGS: clear to auscultation and percussion with normal breathing effort HEART: regular rate & rhythm and no murmurs and no lower extremity edema ABDOMEN:abdomen soft, non-tender and normal bowel sounds Musculoskeletal:no cyanosis of digits and no clubbing  PSYCH: alert & oriented x 3 with fluent speech NEURO: no focal motor/sensory deficits Breasts: Breast inspection showed them to be symmetrical with no nipple discharge. The surgical scar at the left breast inferior to the nipple is healing well, without discharge or skin erythema. Palpation of the breasts and axilla revealed no obvious mass that I could appreciate.  LABORATORY DATA:  I have reviewed the data as listed Lab Results  Component Value Date   WBC 10.3 08/01/2015  HGB 13.2 08/01/2015   HCT 39.0 08/01/2015   MCV 78.7* 08/01/2015   PLT 314 08/01/2015   PATHOLOGY REPORT: Diagnosis 12/19/2014 Breast, left, needle core biopsy, mass, 6 o'clock, slightly outer - DUCTAL PAPILLOMA. - SEE MICROSCOPIC DESCRIPTION.  Diagnosis 02/14/2015 Breast, lumpectomy, left - LOW GRADE DUCTAL CARCINOMA IN SITU INVOLVING INTRADUCTAL PAPILLOMA, SEE COMMENT. - SURGICAL MARGINS NEGATIVE FOR TUMOR - PREVIOUS BIOPSY SITE IDENTIFIED - SEE TUMOR SYNOPTIC TEMPLATE BELOW.  Specimen, including laterality: Left breast Procedure (include lymph node sampling sentinel-non-sentinel): Lumpectomy without lymph node sampling Grade of carcinoma: I of III Necrosis: Absent Estimated tumor size: 5mm See comment Treatment effect: None If present, treatment effect in breast tissue, lymph nodes or both: N/A Distance to closest margin: 2 mm  (anterior) and 1.5 mm (posterior), see comment. If margin positive, focally or broadly: N/A Breast prognostic profile: Pending and will be reported in an addendum. Lymph nodes: Examined: 0 Sentinel 0 Non-sentinel 0 Total Lymph nodes with metastasis: N/A Isolated tumor cells (< 0.2 mm): N/A Micrometastasis ( > 0.2 mm and < 2.0 mm): N/A Macrometastasis (> 2.0 mm): N/A Extranodal extension: N/A PROGNOSTIC INDICATORS - ACIS TNM: pTis, pNX Results: IMMUNOHISTOCHEMICAL AND MORPHOMETRIC ANALYSIS BY THE AUTOMATED CELLULAR IMAGING SYSTEM (ACIS) Estrogen Receptor: 100%, POSITIVE, STRONG STAINING INTENSITY Progesterone Receptor: 100%, POSITIVE, STRONG STAINING INTENSITY  RADIOGRAPHIC STUDIES: I have personally reviewed the radiological images as listed and agreed with the findings in the report.  Screening mammogram and [place] 12/19/2014 FINDINGS: Spot compression views of the left breast confirm a small, oval, partially obscured mass in the posterior aspect of the 6 o'clock position of the breast. There is also a second mass more posteriorly located, partially included on the images, with similar mammographic features. The visualized margins of both masses are circumscribed. These are new since 12/09/2013.  On physical exam, no mass is palpable in the inferior left breast or left axilla.  Ultrasound is performed, showing a 7 x 6 x 5 mm oval, circumscribed, mixed echogenicity mass in the 6 o'clock position of the left breast, 2 cm from the nipple. This is horizontally oriented and has no internal blood flow with color Doppler.  There is a 6 x 5 x 4 mm oval, mildly lobulated hypoechoic mass in the 6 o'clock position of the left breast, 5 cm from the nipple. This is vertically oriented in the radial plane.  Ultrasound of the left axilla demonstrated normal appearing left axillary lymph nodes.  IMPRESSION: Two new, solid masses in the 6 o'clock position of the left breast, as  described above.  ASSESSMENT & PLAN: 54 year old African-American postmenopausal female   1. left breast DCIS and intraductal papilloma s/p lumpectomy, ER/PR positive -The patient had early stage disease. She is considered cured of disease.  Any form of adjuvant treatment is for prevention of disease recurrence.  I discussed the role of adjuvant endocrine therapy. She is postmenopausal and I recommend aromatase inhibitor, such as anastrozole, to reduce her risk of breast cancer in the future. Side effects were discussed of his patient, she agrees to proceed. -She is tolerating anastrozole well, we'll continue, pain for a total 5 years.  -We discussed breast cancer surveillance, she will have annual mammogram.  We discussed healthy diet and a regular exercise, which promotes overall health and reduce risk of breast cancer -I'll refer her to our survivorship clinic, she is interested, we'll set up for her in 3 months  2. Genetic -Her maternal niece had breast cancer at age of 39, 2  brothers has prostate cancer -Her genetic test was negative, except unknown significance variant in [facility] 1 gene.   3. Bone health -She never had bone density scan before. I'll obtain one before her next visit  -I recommend her to take calcium and vitamin D  Plan -contnue anastrozole  -She will see survivorship in 3 months, and see me back in 6 month -mammogram in Dec 2016, ordered today  -bone density scan within a month   All questions were answered. The patient knows to call the clinic with any problems, questions or concerns. I spent 20 minutes counseling the patient face to face. The total time spent in the appointment was 25 minutes and more than 50% was on counseling.     Truitt Merle, MD 08/01/2015 5:31 PM

## 2015-08-08 ENCOUNTER — Ambulatory Visit
Admission: RE | Admit: 2015-08-08 | Discharge: 2015-08-08 | Disposition: A | Discharge status: Home / Self Care | Payer: BLUE CROSS/BLUE SHIELD | Source: Ambulatory Visit | Attending: Hematology | Admitting: Hematology

## 2015-08-08 DIAGNOSIS — Z780 Asymptomatic menopausal state: Secondary | ICD-10-CM

## 2015-08-10 ENCOUNTER — Telehealth: Payer: Self-pay | Admitting: *Deleted

## 2015-08-10 NOTE — Telephone Encounter (Signed)
LM for pt to rtn call to advise Bone Density results were normal per Dr. Burr Medico.

## 2015-11-20 ENCOUNTER — Other Ambulatory Visit: Payer: Self-pay | Admitting: Hematology

## 2015-12-19 ENCOUNTER — Encounter: Payer: Self-pay | Admitting: Radiation Oncology

## 2015-12-19 ENCOUNTER — Ambulatory Visit
Admission: RE | Admit: 2015-12-19 | Discharge: 2015-12-19 | Disposition: A | Discharge status: Home / Self Care | Payer: BLUE CROSS/BLUE SHIELD | Source: Ambulatory Visit | Attending: Radiation Oncology | Admitting: Radiation Oncology

## 2015-12-19 VITALS — BP 127/77 | HR 74 | Temp 97.7°F | Resp 18 | Ht 64.0 in | Wt 159.4 lb

## 2015-12-19 DIAGNOSIS — C50512 Malignant neoplasm of lower-outer quadrant of left female breast: Secondary | ICD-10-CM

## 2015-12-19 NOTE — Progress Notes (Signed)
  Radiation Oncology         [phone] ________________________________  Name: Cynthia Ford MRN: [id number]  Date: 12/19/2015  DOB: 06-28-1961  Follow-Up Visit Note  CC: [name], MD  Truitt Merle, MD    ICD-9-CM ICD-10-CM   1. Breast cancer of lower-outer quadrant of left female breast ([place]) 174.5 C50.512     Diagnosis:  Intraductal carcinoma of the left breast  Interval Since Last Radiation:  7  months  Narrative:  The patient returns today for routine follow-up.                She reports throbbing pain on and off in her left breast. She reports it is worse at night.Not bad enough to require any pain medication. She is taking Arimidex. She reports having hot flashes. She reports fatigue especially if she is not exercising. She has hyperpigmentation on her left breast. She reports having itching. She also reports having some soreness in the lower outer portion of her left breast.  ALLERGIES:  has No Known Allergies.  Meds: Current Outpatient Prescriptions  Medication Sig Dispense Refill  . albuterol (PROVENTIL HFA;VENTOLIN HFA) 108 (90 BASE) MCG/ACT inhaler Inhale into the lungs every 6 (six) hours as needed for wheezing or shortness of breath.    . anastrozole (ARIMIDEX) 1 MG tablet Take 1 tablet (1 mg total) by mouth daily. 90 tablet 1  . cetirizine (ZYRTEC) 10 MG tablet Take 10 mg by mouth daily.      . fluticasone (FLONASE) 50 MCG/ACT nasal spray Place into both nostrils daily.    . fluticasone (FLOVENT HFA) 110 MCG/ACT inhaler Inhale 1 puff into the lungs 2 (two) times daily.      . hyaluronate sodium (RADIAPLEXRX) GEL Apply 1 application topically once.    Marland Kitchen omeprazole (PRILOSEC) 20 MG capsule Take 2 capsules (40 mg total) by mouth daily. 30 capsule 0  . fexofenadine (ALLEGRA) 180 MG tablet Take 180 mg by mouth daily. Reported on [id number]    . non-metallic deodorant Jethro Poling) MISC Apply 1 application topically daily as needed. Reported on 12/19/2015     No  current facility-administered medications for this encounter.    Physical Findings: The patient is in no acute distress. Patient is alert and oriented.  height is 5\' 4"  (1.626 m) and weight is 159 lb 6.4 oz (72.303 kg). Her oral temperature is 97.7 F (36.5 C). Her blood pressure is 127/77 and her pulse is 74. Her respiration is 18. .  The lungs are clear. The heart has a regular rhythm and rate. The left breast area shows some hyperpigmentation changes. No dominant mass appreciated in the breast nipple discharge or bleeding. The right breast exam shows no palpable mass nipple discharge or bleeding.  Lab Findings: Lab Results  Component Value Date   WBC 10.3 08/01/2015   HGB 13.2 08/01/2015   HCT 39.0 08/01/2015   MCV 78.7* 08/01/2015   PLT 314 08/01/2015    Radiographic Findings: No results found.  Impression:  The patient is recovering from the effects of radiation.  No evidence of recurrence on clinical exam today  Plan:  When necessary follow-up in radiation oncology in light of the patient's close follow-up with medical oncology and surgery. Patient is scheduled for mammography in the near future.  ____________________________________ Gery Pray, MD

## 2015-12-19 NOTE — Progress Notes (Signed)
Cynthia Ford here for follow up.  She reports throbbing pain on and off in her left breast.  She reports it is worse at night.  She is taking Arimidex.  She reports having hot flashes.  She reports fatigue especially if she is not exercising.  She has hyperpigmentation on her left breast.  She reports having itching.  She also reports having some soreness in the lower outer portion of her left breast.  BP 127/77 mmHg  Pulse 74  Temp(Src) 97.7 F (36.5 C) (Oral)  Resp 18  Ht 5\' 4"  (1.626 m)  Wt 159 lb 6.4 oz (72.303 kg)  BMI 27.35 kg/m2

## 2015-12-21 ENCOUNTER — Ambulatory Visit: Payer: BLUE CROSS/BLUE SHIELD | Admitting: Radiation Oncology

## 2015-12-26 ENCOUNTER — Ambulatory Visit
Admission: RE | Admit: 2015-12-26 | Discharge: 2015-12-26 | Disposition: A | Discharge status: Home / Self Care | Payer: BLUE CROSS/BLUE SHIELD | Source: Ambulatory Visit | Attending: Hematology | Admitting: Hematology

## 2015-12-26 DIAGNOSIS — C50512 Malignant neoplasm of lower-outer quadrant of left female breast: Secondary | ICD-10-CM

## 2016-01-03 ENCOUNTER — Ambulatory Visit: Payer: BLUE CROSS/BLUE SHIELD | Admitting: Radiation Oncology

## 2016-01-04 ENCOUNTER — Ambulatory Visit: Payer: Self-pay | Admitting: Radiation Oncology

## 2016-02-01 ENCOUNTER — Encounter: Payer: Self-pay | Admitting: Hematology

## 2016-02-01 ENCOUNTER — Ambulatory Visit (HOSPITAL_BASED_OUTPATIENT_CLINIC_OR_DEPARTMENT_OTHER): Payer: Managed Care, Other (non HMO) | Admitting: Hematology

## 2016-02-01 ENCOUNTER — Other Ambulatory Visit (HOSPITAL_BASED_OUTPATIENT_CLINIC_OR_DEPARTMENT_OTHER): Payer: Managed Care, Other (non HMO)

## 2016-02-01 VITALS — BP 121/73 | HR 72 | Temp 98.2°F | Resp 18 | Ht 64.0 in | Wt 160.9 lb

## 2016-02-01 DIAGNOSIS — C50512 Malignant neoplasm of lower-outer quadrant of left female breast: Secondary | ICD-10-CM

## 2016-02-01 DIAGNOSIS — D0512 Intraductal carcinoma in situ of left breast: Secondary | ICD-10-CM | POA: Diagnosis not present

## 2016-02-01 DIAGNOSIS — Z170 Estrogen receptor positive status [ER+]: Secondary | ICD-10-CM | POA: Diagnosis not present

## 2016-02-01 LAB — CBC WITH DIFFERENTIAL/PLATELET
BASO%: 0.1 % (ref 0.0–2.0)
Basophils Absolute: 0 10*3/uL (ref 0.0–0.1)
EOS%: 0.1 % (ref 0.0–7.0)
Eosinophils Absolute: 0 10*3/uL (ref 0.0–0.5)
HCT: 35.9 % (ref 34.8–46.6)
HGB: 12.8 g/dL (ref 11.6–15.9)
LYMPH%: 8.2 % — ABNORMAL LOW (ref 14.0–49.7)
MCH: 27.2 pg (ref 25.1–34.0)
MCHC: 35.7 g/dL (ref 31.5–36.0)
MCV: 76.2 fL — ABNORMAL LOW (ref 79.5–101.0)
MONO#: 0.6 10*3/uL (ref 0.1–0.9)
MONO%: 4.4 % (ref 0.0–14.0)
NEUT#: 11.1 10*3/uL — ABNORMAL HIGH (ref 1.5–6.5)
NEUT%: 87.2 % — ABNORMAL HIGH (ref 38.4–76.8)
Platelets: 300 10*3/uL (ref 145–400)
RBC: 4.71 10*6/uL (ref 3.70–5.45)
RDW: 13.4 % (ref 11.2–14.5)
WBC: 12.8 10*3/uL — ABNORMAL HIGH (ref 3.9–10.3)
lymph#: 1 10*3/uL (ref 0.9–3.3)
nRBC: 0 % (ref 0–0)

## 2016-02-01 LAB — COMPREHENSIVE METABOLIC PANEL
ALT: 16 U/L (ref 0–55)
AST: 16 U/L (ref 5–34)
Albumin: 4 g/dL (ref 3.5–5.0)
Alkaline Phosphatase: 109 U/L (ref 40–150)
Anion Gap: 11 mEq/L (ref 3–11)
BUN: 13.9 mg/dL (ref 7.0–26.0)
CO2: 23 mEq/L (ref 22–29)
Calcium: 9.7 mg/dL (ref 8.4–10.4)
Chloride: 106 mEq/L (ref 98–109)
Creatinine: 1 mg/dL (ref 0.6–1.1)
EGFR: 71 mL/min/{1.73_m2} — ABNORMAL LOW (ref >90)
Glucose: 143 mg/dl — ABNORMAL HIGH (ref 70–140)
Potassium: 3.9 mEq/L (ref 3.5–5.1)
Sodium: 140 mEq/L (ref 136–145)
Total Bilirubin: <0.3 mg/dL (ref 0.20–1.20)
Total Protein: 8 g/dL (ref 6.4–8.3)

## 2016-02-01 NOTE — Progress Notes (Signed)
[place]  Telephone:(336) [phone] Fax:(336) [phone]  Clinic Follow Up Note   Patient Care Team: Dibas Koirala, MD as PCP - General (Family Medicine) 02/01/2016  CHIEF COMPLAINTS:  Follow up left breast DCIS  Oncology History   Breast cancer of lower-outer quadrant of left female breast   Staging form: Breast, AJCC 7th Edition     Clinical: Stage 0 (Tis (DCIS), N0, M0) - Unsigned       Breast cancer of lower-outer quadrant of left female breast ([place])   12/19/2014 Pathology Results Ductal papilloma   12/19/2014 Mammogram 2 new solid masses (31mm, 57mm) in the 6:00 position of the left breast.   02/14/2015 Initial Diagnosis Breast cancer of lower-outer quadrant of left female breast   02/14/2015 Surgery Left breast lumpectomy, negative margins.    02/14/2015 Pathologic Stage Low-grade ductal carcinoma in situ involving intraductal papilloma, tumor 2 mm, ER 100%, PR 100% positive.   04/10/2015 - 05/26/2015 Radiation Therapy adjuvant breast radiation    06/30/2015 -  Anti-estrogen oral therapy anastrzaole 1mg  dialy     HISTORY OF PRESENTING ILLNESS:  Cynthia Ford 55 y.o. female is here because of recently diagnosed left breast DCIS. She presented with her female partner to the clinic today.  She had papilloma in 2010 and had lumpectomy. She has been followed up with annual mammogram. Her mammogram on 12/19/2014 showed a 2 new solid mass in the 6:00 position of the left breast. She underwent biopsy which showed ductal papilloma. She subsequently underwent left breast lumpectomy on 02/14/2015, and the surgical path showed DCIS and intraductal papilloma.  She has recovered very well from surgery. She has minimal discomfort at the surgical site, has good appetite and energy level. No other complaints.  In terms of breast cancer risk profile:  She menarched at early age of 47 and went to menopause at age 52 She had 0 pregnancy.  She never received birth control pills for  approximately .  She was  never exposed to fertility medications or hormone replacement therapy.  She has  family history of Breast cancer in her niece   CURRENT THERAPY: anastrozole 1mg  daily started on 06/30/2015  INTERIM HISTORY: Urja returns for follow-up. She has been complaint with anastrozole and tolerates it well. She has a mild hot flash, manageable, location and joint discomfort, she exercises (walking) regularly. No other complains.    MEDICAL HISTORY:  Past Medical History  Diagnosis Date  . Asthma   . Anxiety   . GERD (gastroesophageal reflux disease)   . Anemia   . PONV (postoperative nausea and vomiting)   . Papilloma of left breast     SURGICAL HISTORY: Past Surgical History  Procedure Laterality Date  . Cholecystectomy  2001  . Breast surgery  2010    LEFT  . Nasal passage  2000  . Knee arthroscopy Left 1999/1993  . Nasal septum surgery    . Breast lumpectomy with radioactive seed localization Left 02/14/2015    Procedure: BREAST LUMPECTOMY WITH RADIOACTIVE SEED LOCALIZATION;  Surgeon: Erroll Luna, MD;  Location: [place];  Service: General;  Laterality: Left;    SOCIAL HISTORY: Social History   Social History  . Marital Status: Single    Spouse Name: N/A  . Number of Children: 0  . 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 Smoking status: Never Smoker   . Smokeless tobacco: Not on file  .  Alcohol Use: No  . Drug Use: No  . Sexual Activity: Yes    Birth Control/ Protection: Post-menopausal   Other Topics Concern  . Not on file   Social History Narrative    FAMILY HISTORY: Family History  Problem Relation Age of Onset  . Breast cancer  19    niece  . Prostate cancer Brother 20  . Prostate cancer Brother 89  . Prostate cancer Maternal Grandfather 83.    ALLERGIES:  has No Known Allergies.  MEDICATIONS:  Current Outpatient Prescriptions  Medication Sig  Dispense Refill  . albuterol (PROVENTIL HFA;VENTOLIN HFA) 108 (90 BASE) MCG/ACT inhaler Inhale into the lungs every 6 (six) hours as needed for wheezing or shortness of breath.    . anastrozole (ARIMIDEX) 1 MG tablet Take 1 tablet (1 mg total) by mouth daily. 90 tablet 1  . cetirizine (ZYRTEC) 10 MG tablet Take 10 mg by mouth daily.      . fexofenadine (ALLEGRA) 180 MG tablet Take 180 mg by mouth daily. Reported on 12/19/2015    . fluticasone (FLONASE) 50 MCG/ACT nasal spray Place into both nostrils daily.    . fluticasone (FLOVENT HFA) 110 MCG/ACT inhaler Inhale 1 puff into the lungs 2 (two) times daily.      . hyaluronate sodium (RADIAPLEXRX) GEL Apply 1 application topically once.    . non-metallic deodorant Jethro Poling) MISC Apply 1 application topically daily as needed. Reported on 12/19/2015    . omeprazole (PRILOSEC) 20 MG capsule Take 2 capsules (40 mg total) by mouth daily. 30 capsule 0   No current facility-administered medications for this visit.    REVIEW OF SYSTEMS:   Constitutional: Denies fevers, chills or abnormal night sweats Eyes: Denies blurriness of vision, double vision or watery eyes Ears, nose, mouth, throat, and face: Denies mucositis or sore throat Respiratory: Denies cough, dyspnea or wheezes Cardiovascular: Denies palpitation, chest discomfort or lower extremity swelling Gastrointestinal:  Denies nausea, heartburn or change in bowel habits Skin: Denies abnormal skin rashes Lymphatics: Denies new lymphadenopathy or easy bruising Neurological:Denies numbness, tingling or new weaknesses Behavioral/Psych: Mood is stable, no new changes  All other systems were reviewed with the patient and are negative.  PHYSICAL EXAMINATION: ECOG PERFORMANCE STATUS: 0 - Asymptomatic  Filed Vitals:   02/01/16 1428  BP: 121/73  Pulse: 72  Temp: 98.2 F (36.8 C)  Resp: 18   Filed Weights   02/01/16 1428  Weight: 160 lb 14.4 oz (72.984 kg)    GENERAL:alert, no distress and  comfortable SKIN: skin color, texture, turgor are normal, no rashes or significant lesions EYES: normal, conjunctiva are pink and non-injected, sclera clear OROPHARYNX:no exudate, no erythema and lips, buccal mucosa, and tongue normal  NECK: supple, thyroid normal size, non-tender, without nodularity LYMPH:  no palpable lymphadenopathy in the cervical, axillary or inguinal LUNGS: clear to auscultation and percussion with normal breathing effort HEART: regular rate & rhythm and no murmurs and no lower extremity edema ABDOMEN:abdomen soft, non-tender and normal bowel sounds Musculoskeletal:no cyanosis of digits and no clubbing  PSYCH: alert & oriented x 3 with fluent speech NEURO: no focal motor/sensory deficits Breasts: Breast inspection showed them to be symmetrical with no nipple discharge. The surgical scar at the left breast inferior to the nipple is healing well, without discharge or skin erythema. Palpation of the breasts and axilla revealed no obvious mass that I could appreciate.  LABORATORY DATA:  I have reviewed the data as listed  CBC Latest Ref Rng 02/01/2016 08/01/2015 06/20/2015  WBC 3.9 - 10.3 10e3/uL 12.8(H) 10.3 6.0  Hemoglobin 11.6 - 15.9 g/dL 12.8 13.2 12.5  Hematocrit 34.8 - 46.6 % 35.9 39.0 36.5  Platelets 145 - 400 10e3/uL 300 314 267    CMP Latest Ref Rng 02/01/2016 08/01/2015 06/20/2015  Glucose 70 - 140 mg/dl 143(H) 99 68(L)  BUN 7.0 - 26.0 mg/dL 13.9 8.8 7.9  Creatinine 0.6 - 1.1 mg/dL 1.0 0.8 0.9  Sodium 136 - 145 mEq/L 140 139 140  Potassium 3.5 - 5.1 mEq/L 3.9 4.0 3.3(L)  Chloride 96 - 112 mEq/L - - -  CO2 22 - 29 mEq/L 23 29 30(H)  Calcium 8.4 - 10.4 mg/dL 9.7 9.5 9.4  Total Protein 6.4 - 8.3 g/dL 8.0 7.6 7.1  Total Bilirubin 0.20 - 1.20 mg/dL <0.30 0.30 0.34  Alkaline Phos 40 - 150 U/L 109 108 94  AST 5 - 34 U/L 16 15 18   ALT 0 - 55 U/L 16 15 19      PATHOLOGY REPORT: Diagnosis 12/19/2014 Breast, left, needle core biopsy, mass, 6 o'clock, slightly  outer - DUCTAL PAPILLOMA. - SEE MICROSCOPIC DESCRIPTION.  Diagnosis 02/14/2015 Breast, lumpectomy, left - LOW GRADE DUCTAL CARCINOMA IN SITU INVOLVING INTRADUCTAL PAPILLOMA, SEE COMMENT. - SURGICAL MARGINS NEGATIVE FOR TUMOR - PREVIOUS BIOPSY SITE IDENTIFIED - SEE TUMOR SYNOPTIC TEMPLATE BELOW.  Specimen, including laterality: Left breast Procedure (include lymph node sampling sentinel-non-sentinel): Lumpectomy without lymph node sampling Grade of carcinoma: I of III Necrosis: Absent Estimated tumor size: 42mm See comment Treatment effect: None If present, treatment effect in breast tissue, lymph nodes or both: N/A Distance to closest margin: 2 mm (anterior) and 1.5 mm (posterior), see comment. If margin positive, focally or broadly: N/A Breast prognostic profile: Pending and will be reported in an addendum. Lymph nodes: Examined: 0 Sentinel 0 Non-sentinel 0 Total Lymph nodes with metastasis: N/A Isolated tumor cells (< 0.2 mm): N/A Micrometastasis ( > 0.2 mm and < 2.0 mm): N/A Macrometastasis (> 2.0 mm): N/A Extranodal extension: N/A PROGNOSTIC INDICATORS - ACIS TNM: pTis, pNX Results: IMMUNOHISTOCHEMICAL AND MORPHOMETRIC ANALYSIS BY THE AUTOMATED CELLULAR IMAGING SYSTEM (ACIS) Estrogen Receptor: 100%, POSITIVE, STRONG STAINING INTENSITY Progesterone Receptor: 100%, POSITIVE, STRONG STAINING INTENSITY  RADIOGRAPHIC STUDIES: I have personally reviewed the radiological images as listed and agreed with the findings in the report.  DEXA 08/08/2015 ASSESSMENT: Patient's diagnostic category is NORMAL by WHO Criteria.  MAMMOGRAOM 12/26/2015 IMPRESSION: No evidence of malignancy. Benign postsurgical scarring on the left.  Bone density scan 08/08/2015 ASSESSMENT: Patient's diagnostic category is NORMAL by WHO Criteria.  ASSESSMENT & PLAN: 55 year old African-American postmenopausal female   1. left breast DCIS and intraductal papilloma s/p lumpectomy, ER/PR positive -The  patient had early stage disease. She is considered cured of disease.  -Any form of adjuvant treatment is for prevention of disease recurrence.  -she is on anastrozole for breast cancer prevention, is tolerating anastrozole well, we'll continue, plan for a total 5 years.  -We discussed breast cancer surveillance, she will have annual mammogram, and a regular follow-up with [place].  -Her recent mammogram was normal. Lab results reviewed with her, which were unremarkable. -I encouraged her to continue healthy diet and regular exercise, which promotes overall health and reduce risk of breast cancer  2. Genetic -Her maternal niece had breast cancer at age of 55, 2 brothers has prostate cancer -Her genetic test was negative, except unknown significance variant in [facility] 1 gene.   3. Bone health -Her bone density scan in August 2016 was normal -I recommend  her to take calcium and vitamin D  Plan -contnue anastrozole  -Return to clinic in 4 months, if continues doing well, will see her every 6 months after next appointment   All questions were answered. The patient knows to call the clinic with any problems, questions or concerns.  I spent 15 minutes counseling the patient face to face. The total time spent in the appointment was 20 minutes and more than 50% was on counseling.     Truitt Merle, MD 02/01/2016 1:51 PM

## 2016-02-02 ENCOUNTER — Telehealth: Payer: Self-pay | Admitting: Hematology

## 2016-02-02 NOTE — Telephone Encounter (Signed)
per pof to sch pt appt-cld pt & gave pt time & date of next appt 06/03/16

## 2016-02-04 ENCOUNTER — Encounter: Payer: Self-pay | Admitting: Hematology

## 2016-02-07 ENCOUNTER — Telehealth: Payer: Self-pay

## 2016-02-07 NOTE — Telephone Encounter (Signed)
Pt called asking if it is OK to take 50,000 vitamin D for 12 weeks recommended by her OB/GYN.  Her vitamin D lab level was 9. This RN told her it was OK and would let Dr Burr Medico know of this.

## 2016-02-24 ENCOUNTER — Other Ambulatory Visit: Payer: Self-pay | Admitting: Hematology

## 2016-02-28 ENCOUNTER — Other Ambulatory Visit: Payer: Self-pay | Admitting: *Deleted

## 2016-02-28 DIAGNOSIS — C50512 Malignant neoplasm of lower-outer quadrant of left female breast: Secondary | ICD-10-CM

## 2016-02-28 MED ORDER — ANASTROZOLE 1 MG PO TABS: 1.0000 mg | ORAL_TABLET | Freq: Every day | ORAL | Status: DC

## 2016-06-03 ENCOUNTER — Ambulatory Visit (HOSPITAL_BASED_OUTPATIENT_CLINIC_OR_DEPARTMENT_OTHER): Payer: Managed Care, Other (non HMO) | Admitting: Hematology

## 2016-06-03 ENCOUNTER — Telehealth: Payer: Self-pay | Admitting: Hematology

## 2016-06-03 ENCOUNTER — Other Ambulatory Visit (HOSPITAL_BASED_OUTPATIENT_CLINIC_OR_DEPARTMENT_OTHER): Payer: Managed Care, Other (non HMO)

## 2016-06-03 ENCOUNTER — Encounter: Payer: Self-pay | Admitting: Hematology

## 2016-06-03 VITALS — BP 120/78 | HR 67 | Temp 98.1°F | Resp 18 | Ht 64.0 in | Wt 155.7 lb

## 2016-06-03 DIAGNOSIS — D0512 Intraductal carcinoma in situ of left breast: Secondary | ICD-10-CM

## 2016-06-03 DIAGNOSIS — E559 Vitamin D deficiency, unspecified: Secondary | ICD-10-CM | POA: Diagnosis not present

## 2016-06-03 DIAGNOSIS — C50512 Malignant neoplasm of lower-outer quadrant of left female breast: Secondary | ICD-10-CM

## 2016-06-03 LAB — COMPREHENSIVE METABOLIC PANEL
ALT: 18 U/L (ref 0–55)
AST: 21 U/L (ref 5–34)
Albumin: 4 g/dL (ref 3.5–5.0)
Alkaline Phosphatase: 102 U/L (ref 40–150)
Anion Gap: 8 mEq/L (ref 3–11)
BUN: 15.9 mg/dL (ref 7.0–26.0)
CO2: 28 mEq/L (ref 22–29)
Calcium: 10 mg/dL (ref 8.4–10.4)
Chloride: 106 mEq/L (ref 98–109)
Creatinine: 1 mg/dL (ref 0.6–1.1)
EGFR: 74 mL/min/{1.73_m2} — ABNORMAL LOW (ref >90)
Glucose: 92 mg/dl (ref 70–140)
Potassium: 3.9 mEq/L (ref 3.5–5.1)
Sodium: 142 mEq/L (ref 136–145)
Total Bilirubin: 0.52 mg/dL (ref 0.20–1.20)
Total Protein: 8.1 g/dL (ref 6.4–8.3)

## 2016-06-03 LAB — CBC WITH DIFFERENTIAL/PLATELET
BASO%: 0.9 % (ref 0.0–2.0)
Basophils Absolute: 0.1 10*3/uL (ref 0.0–0.1)
EOS%: 12.1 % — ABNORMAL HIGH (ref 0.0–7.0)
Eosinophils Absolute: 0.9 10*3/uL — ABNORMAL HIGH (ref 0.0–0.5)
HCT: 39.7 % (ref 34.8–46.6)
HGB: 13.6 g/dL (ref 11.6–15.9)
LYMPH%: 17.3 % (ref 14.0–49.7)
MCH: 26.8 pg (ref 25.1–34.0)
MCHC: 34.4 g/dL (ref 31.5–36.0)
MCV: 78 fL — ABNORMAL LOW (ref 79.5–101.0)
MONO#: 0.6 10*3/uL (ref 0.1–0.9)
MONO%: 8.3 % (ref 0.0–14.0)
NEUT#: 4.8 10*3/uL (ref 1.5–6.5)
NEUT%: 61.4 % (ref 38.4–76.8)
Platelets: 275 10*3/uL (ref 145–400)
RBC: 5.08 10*6/uL (ref 3.70–5.45)
RDW: 13.3 % (ref 11.2–14.5)
WBC: 7.8 10*3/uL (ref 3.9–10.3)
lymph#: 1.3 10*3/uL (ref 0.9–3.3)

## 2016-06-03 NOTE — Telephone Encounter (Signed)
per pof to sch pt appt-sent Dr Burr Medico email to put in order for mamma

## 2016-06-03 NOTE — Progress Notes (Signed)
[place]  Telephone:(336) [phone] Fax:(336) [phone]  Clinic Follow Up Note   Patient Care Team: Dibas Koirala, MD as PCP - General (Family Medicine) 06/03/2016  CHIEF COMPLAINTS:  Follow up left breast DCIS  Oncology History   Breast cancer of lower-outer quadrant of left female breast   Staging form: Breast, AJCC 7th Edition     Clinical: Stage 0 (Tis (DCIS), N0, M0) - Unsigned       Breast cancer of lower-outer quadrant of left female breast ([place])   12/19/2014 Pathology Results Ductal papilloma   12/19/2014 Mammogram 2 new solid masses (17mm, 35mm) in the 6:00 position of the left breast.   02/14/2015 Initial Diagnosis Breast cancer of lower-outer quadrant of left female breast   02/14/2015 Surgery Left breast lumpectomy, negative margins.    02/14/2015 Pathologic Stage Low-grade ductal carcinoma in situ involving intraductal papilloma, tumor 2 mm, ER 100%, PR 100% positive.   04/10/2015 - 05/26/2015 Radiation Therapy adjuvant breast radiation    06/30/2015 -  Anti-estrogen oral therapy anastrzaole 1mg  dialy     HISTORY OF PRESENTING ILLNESS:  Cynthia Ford 55 y.o. female is here because of recently diagnosed left breast DCIS. She presented with her female partner to the clinic today.  She had papilloma in 2010 and had lumpectomy. She has been followed up with annual mammogram. Her mammogram on 12/19/2014 showed a 2 new solid mass in the 6:00 position of the left breast. She underwent biopsy which showed ductal papilloma. She subsequently underwent left breast lumpectomy on 02/14/2015, and the surgical path showed DCIS and intraductal papilloma.  She has recovered very well from surgery. She has minimal discomfort at the surgical site, has good appetite and energy level. No other complaints.  In terms of breast cancer risk profile:  She menarched at early age of 71 and went to menopause at age 39 She had 0 pregnancy.  She never received birth control pills for  approximately .  She was  never exposed to fertility medications or hormone replacement therapy.  She has  family history of Breast cancer in her niece   CURRENT THERAPY: anastrozole 1mg  daily started on 06/30/2015  INTERIM HISTORY: Cynthia Ford returns for follow-up. She is doing well overall. She is compliant with anastrozole, still has mild to moderate hot flash, mainly during the day, it is manageable. She also has mild intermittent joint discomfort at the bilateral breast and knee, does not need any pain medication. She has been exercise regularly, and eat healthy, she has lost about 4 pounds intentionally. Her vitamin D level was low, her primary care physician give her high dose vitamin D, she is on regular dose 1000U a day now.   MEDICAL HISTORY:  Past Medical History  Diagnosis Date  . Asthma   . Anxiety   . GERD (gastroesophageal reflux disease)   . Anemia   . PONV (postoperative nausea and vomiting)   . Papilloma of left breast     SURGICAL HISTORY: Past Surgical History  Procedure Laterality Date  . Cholecystectomy  2001  . Breast surgery  2010    LEFT  . Nasal passage  2000  . Knee arthroscopy Left 1999/1993  . Nasal septum surgery    . Breast lumpectomy with radioactive seed localization Left 02/14/2015    Procedure: BREAST LUMPECTOMY WITH RADIOACTIVE SEED LOCALIZATION;  Surgeon: Erroll Luna, MD;  Location: [place];  Service: General;  Laterality: Left;    SOCIAL HISTORY: Social History   Social  History  . Marital Status: Single    Spouse Name: N/A  . Number of Children: 0  . 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 Smoking status: Never Smoker   . Smokeless tobacco: Not on file  . Alcohol Use: No  . Drug Use: No  . Sexual Activity: Yes    Birth Control/ Protection: Post-menopausal   Other Topics Concern  . Not on file   Social History Narrative    FAMILY HISTORY: Family  History  Problem Relation Age of Onset  . Breast cancer  48    niece  . Prostate cancer Brother 96  . Prostate cancer Brother 22  . Prostate cancer Maternal Grandfather 68.    ALLERGIES:  has No Known Allergies.  MEDICATIONS:  Current Outpatient Prescriptions  Medication Sig Dispense Refill  . albuterol (PROVENTIL HFA;VENTOLIN HFA) 108 (90 BASE) MCG/ACT inhaler Inhale into the lungs every 6 (six) hours as needed for wheezing or shortness of breath.    . anastrozole (ARIMIDEX) 1 MG tablet Take 1 tablet (1 mg total) by mouth daily. 90 tablet 3  . cetirizine (ZYRTEC) 10 MG tablet Take 10 mg by mouth daily.      . DULERA 100-5 MCG/ACT AERO Inhale 2 puffs into the lungs 2 (two) times daily.  11  . fluticasone (FLOVENT HFA) 110 MCG/ACT inhaler Inhale 1 puff into the lungs 2 (two) times daily.      . hyaluronate sodium (RADIAPLEXRX) GEL Apply 1 application topically once.    . non-metallic deodorant Jethro Poling) MISC Apply 1 application topically daily as needed. Reported on 02/01/2016    . omeprazole (PRILOSEC) 20 MG capsule Take 2 capsules (40 mg total) by mouth daily. 30 capsule 0  . promethazine-codeine (PHENERGAN WITH CODEINE) 6.25-10 MG/5ML syrup TAKE 5 MLS EVERY 6 HOURS AS NEEDED FOR SEVERE COUGH  0   No current facility-administered medications for this visit.    REVIEW OF SYSTEMS:   Constitutional: Denies fevers, chills or abnormal night sweats Eyes: Denies blurriness of vision, double vision or watery eyes Ears, nose, mouth, throat, and face: Denies mucositis or sore throat Respiratory: Denies cough, dyspnea or wheezes Cardiovascular: Denies palpitation, chest discomfort or lower extremity swelling Gastrointestinal:  Denies nausea, heartburn or change in bowel habits Skin: Denies abnormal skin rashes Lymphatics: Denies new lymphadenopathy or easy bruising Neurological:Denies numbness, tingling or new weaknesses Behavioral/Psych: Mood is stable, no new changes  All other systems were  reviewed with the patient and are negative.  PHYSICAL EXAMINATION: ECOG PERFORMANCE STATUS: 0 - Asymptomatic  Filed Vitals:   06/03/16 0840  BP: 120/78  Pulse: 67  Temp: 98.1 F (36.7 C)  Resp: 18   Filed Weights   06/03/16 0840  Weight: 155 lb 11.2 oz (70.625 kg)    GENERAL:alert, no distress and comfortable SKIN: skin color, texture, turgor are normal, no rashes or significant lesions EYES: normal, conjunctiva are pink and non-injected, sclera clear OROPHARYNX:no exudate, no erythema and lips, buccal mucosa, and tongue normal  NECK: supple, thyroid normal size, non-tender, without nodularity LYMPH:  no palpable lymphadenopathy in the cervical, axillary or inguinal LUNGS: clear to auscultation and percussion with normal breathing effort HEART: regular rate & rhythm and no murmurs and no lower extremity edema ABDOMEN:abdomen soft, non-tender and normal bowel sounds Musculoskeletal:no cyanosis of digits and no clubbing  PSYCH: alert & oriented x 3 with fluent speech NEURO: no focal motor/sensory deficits Breasts:  Breast inspection showed them to be symmetrical with no nipple discharge. The surgical scar at the left breast inferior to the nipple healed well.. Palpation of the breasts and axilla revealed no obvious mass that I could appreciate.  LABORATORY DATA:  I have reviewed the data as listed  CBC Latest Ref Rng 06/03/2016 02/01/2016 08/01/2015  WBC 3.9 - 10.3 10e3/uL 7.8 12.8(H) 10.3  Hemoglobin 11.6 - 15.9 g/dL 13.6 12.8 13.2  Hematocrit 34.8 - 46.6 % 39.7 35.9 39.0  Platelets 145 - 400 10e3/uL 275 300 314    CMP Latest Ref Rng 06/03/2016 02/01/2016 08/01/2015  Glucose 70 - 140 mg/dl 92 143(H) 99  BUN 7.0 - 26.0 mg/dL 15.9 13.9 8.8  Creatinine 0.6 - 1.1 mg/dL 1.0 1.0 0.8  Sodium 136 - 145 mEq/L 142 140 139  Potassium 3.5 - 5.1 mEq/L 3.9 3.9 4.0  CO2 22 - 29 mEq/L 28 23 29   Calcium 8.4 - 10.4 mg/dL 10.0 9.7 9.5  Total Protein 6.4 - 8.3 g/dL 8.1 8.0 7.6  Total Bilirubin 0.20  - 1.20 mg/dL 0.52 <0.30 0.30  Alkaline Phos 40 - 150 U/L 102 109 108  AST 5 - 34 U/L 21 16 15   ALT 0 - 55 U/L 18 16 15      PATHOLOGY REPORT: Diagnosis 12/19/2014 Breast, left, needle core biopsy, mass, 6 o'clock, slightly outer - DUCTAL PAPILLOMA. - SEE MICROSCOPIC DESCRIPTION.  Diagnosis 02/14/2015 Breast, lumpectomy, left - LOW GRADE DUCTAL CARCINOMA IN SITU INVOLVING INTRADUCTAL PAPILLOMA, SEE COMMENT. - SURGICAL MARGINS NEGATIVE FOR TUMOR - PREVIOUS BIOPSY SITE IDENTIFIED - SEE TUMOR SYNOPTIC TEMPLATE BELOW.  Specimen, including laterality: Left breast Procedure (include lymph node sampling sentinel-non-sentinel): Lumpectomy without lymph node sampling Grade of carcinoma: I of III Necrosis: Absent Estimated tumor size: 20mm See comment Treatment effect: None If present, treatment effect in breast tissue, lymph nodes or both: N/A Distance to closest margin: 2 mm (anterior) and 1.5 mm (posterior), see comment. If margin positive, focally or broadly: N/A Breast prognostic profile: Pending and will be reported in an addendum. Lymph nodes: Examined: 0 Sentinel 0 Non-sentinel 0 Total Lymph nodes with metastasis: N/A Isolated tumor cells (< 0.2 mm): N/A Micrometastasis ( > 0.2 mm and < 2.0 mm): N/A Macrometastasis (> 2.0 mm): N/A Extranodal extension: N/A PROGNOSTIC INDICATORS - ACIS TNM: pTis, pNX Results: IMMUNOHISTOCHEMICAL AND MORPHOMETRIC ANALYSIS BY THE AUTOMATED CELLULAR IMAGING SYSTEM (ACIS) Estrogen Receptor: 100%, POSITIVE, STRONG STAINING INTENSITY Progesterone Receptor: 100%, POSITIVE, STRONG STAINING INTENSITY  RADIOGRAPHIC STUDIES: I have personally reviewed the radiological images as listed and agreed with the findings in the report.  DEXA 08/08/2015 ASSESSMENT: Patient's diagnostic category is NORMAL by WHO Criteria.  MAMMOGRAOM 12/26/2015 IMPRESSION: No evidence of malignancy. Benign postsurgical scarring on the left.  Bone density scan  08/08/2015 ASSESSMENT: Patient's diagnostic category is NORMAL by WHO Criteria.  ASSESSMENT & PLAN: 55 year old African-American postmenopausal female   1. Breat cancer of lower-outer quadrant left breast -Her pathology showed DCIS and intraductal papilloma s/p lumpectomy, ER/PR positive -The patient had early stage disease. She is considered cured of disease.  -Any form of adjuvant treatment is for prevention of disease recurrence.  -she is on anastrozole for breast cancer prevention, is tolerating anastrozole well, we'll continue, plan for a total 5 years.  -We discussed breast cancer surveillance, she will have annual mammogram, and a regular follow-up with [place].  -Her last mammogram was normal. Lab results reviewed with her, which were unremarkable. -I encouraged her to continue healthy diet and regular exercise,  which promotes overall health and reduce risk of breast cancer, she is compliant on that. -Lab results reviewed with her, unremarkable CBC and CMP   2. Genetic -Her maternal niece had breast cancer at age of 52, 2 brothers has prostate cancer -Her genetic test was negative, except unknown significance variant in [facility] 1 gene.   3. Bone health -Her bone density scan in August 2016 was normal -I encouraged her to continue calcium and vitamin D  4. Vitamin D deficiency -She was found to have low vitamin D, treated with high-dose vitamin D by her primary care physician -She is currently on vitamin D 1000 units a day, will follow up with her primary care physician.  Plan -contnue anastrozole  -Return to clinic in 6 months, annual screening mammogram in December 2017.   All questions were answered. The patient knows to call the clinic with any problems, questions or concerns.  I spent 15 minutes counseling the patient face to face. The total time spent in the appointment was 20 minutes and more than 50% was on counseling.     Truitt Merle, MD 06/03/2016 8:57 AM

## 2016-06-03 NOTE — Addendum Note (Signed)
Addended by: Truitt Merle on: 06/03/2016 03:47 PM   Modules accepted: Orders

## 2016-06-04 ENCOUNTER — Telehealth: Payer: Self-pay | Admitting: Hematology

## 2016-06-04 NOTE — Telephone Encounter (Signed)
cld & spoke to pt and gave pt time /date/location of mamma-pt wanted to move appt to after-r/s and gave pt r/s time & dtae

## 2016-09-20 ENCOUNTER — Ambulatory Visit
Admission: RE | Admit: 2016-09-20 | Discharge: 2016-09-20 | Disposition: A | Discharge status: Home / Self Care | Payer: Managed Care, Other (non HMO) | Source: Ambulatory Visit | Attending: Family Medicine | Admitting: Family Medicine

## 2016-09-20 ENCOUNTER — Other Ambulatory Visit: Payer: Self-pay | Admitting: Family Medicine

## 2016-09-20 DIAGNOSIS — J069 Acute upper respiratory infection, unspecified: Secondary | ICD-10-CM

## 2016-12-02 ENCOUNTER — Ambulatory Visit: Payer: No Typology Code available for payment source | Admitting: Hematology

## 2016-12-02 ENCOUNTER — Other Ambulatory Visit: Payer: No Typology Code available for payment source

## 2016-12-13 ENCOUNTER — Telehealth: Payer: Self-pay | Admitting: Hematology

## 2016-12-13 NOTE — Telephone Encounter (Signed)
S/w pt to advise of appt chg from 12/29 to 01/06/17 @ 8.45. [place] and will call us next week after she has looked at her schedule.

## 2016-12-18 ENCOUNTER — Ambulatory Visit: Payer: BLUE CROSS/BLUE SHIELD | Admitting: Radiation Oncology

## 2016-12-26 ENCOUNTER — Ambulatory Visit
Admission: RE | Admit: 2016-12-26 | Discharge: 2016-12-26 | Disposition: A | Discharge status: Home / Self Care | Payer: Managed Care, Other (non HMO) | Source: Ambulatory Visit | Attending: Hematology | Admitting: Hematology

## 2016-12-26 DIAGNOSIS — C50512 Malignant neoplasm of lower-outer quadrant of left female breast: Secondary | ICD-10-CM

## 2016-12-27 ENCOUNTER — Other Ambulatory Visit: Payer: No Typology Code available for payment source

## 2016-12-27 ENCOUNTER — Ambulatory Visit: Payer: No Typology Code available for payment source | Admitting: Hematology

## 2017-01-02 NOTE — Progress Notes (Signed)
[place]  [phone] [phone]  Clinic Follow Up Note   Patient Care Team: Dibas Koirala, MD as PCP - General (Family Medicine) 01/06/2017  CHIEF COMPLAINTS:  Follow up left breast DCIS  Oncology History   Breast cancer of lower-outer quadrant of left female breast   Staging form: Breast, AJCC 7th Edition     Clinical: Stage 0 (Tis (DCIS), N0, M0) - Unsigned       Ductal carcinoma in situ (DCIS) of left breast   12/19/2014 Pathology Results    Ductal papilloma      12/19/2014 Mammogram    2 new solid masses (28mm, 34mm) in the 6:00 position of the left breast.      02/14/2015 Initial Diagnosis    Breast cancer of lower-outer quadrant of left female breast      02/14/2015 Surgery    Left breast lumpectomy, negative margins.       02/14/2015 Pathologic Stage    Low-grade ductal carcinoma in situ involving intraductal papilloma, tumor 2 mm, ER 100%, PR 100% positive.      04/10/2015 - 05/26/2015 Radiation Therapy    adjuvant breast radiation       06/30/2015 -  Anti-estrogen oral therapy    anastrzaole 1mg  dialy       12/26/2015 Imaging    MM DIAG BREAST TOMO BILATERAL 12/26/2015 IMPRESSION: No evidence of malignancy. Benign postsurgical scarring on the left. BI-RADS CATEGORY  2: Benign.      12/26/2016 Imaging    MM DIAG BREAST TOMO BILATERAL 12/26/2016 IMPRESSION: No findings worrisome for recurrent tumor or developing malignancy. Stable breast parenchymal pattern. BI-RADS CATEGORY  1: Negative.       HISTORY OF PRESENTING ILLNESS:  Cynthia Ford 56 y.o. female is here because of recently diagnosed left breast DCIS. She presented with her female partner to the clinic today.  She had papilloma in 2010 and had lumpectomy. She has been followed up with annual mammogram. Her mammogram on 12/19/2014 showed a 2 new solid mass in the 6:00 position of the left breast. She underwent biopsy which showed ductal papilloma. She  subsequently underwent left breast lumpectomy on 02/14/2015, and the surgical path showed DCIS and intraductal papilloma.  She has recovered very well from surgery. She has minimal discomfort at the surgical site, has good appetite and energy level. No other complaints.  In terms of breast cancer risk profile:  She menarched at early age of 3 and went to menopause at age 22 She had 0 pregnancy.  She never received birth control pills for approximately .  She was  never exposed to fertility medications or hormone replacement therapy.  She has  family history of Breast cancer in her niece   CURRENT THERAPY: anastrozole 1mg  daily started on 06/30/2015  INTERIM HISTORY: Mckinsley returns for follow-up. She is doing well overall. She is compliant with anastrozole, still has mild to moderate hot flashes, happens during the day and night. The hot flashes are manageable. She believes eating sweets may trigger the hot flashes. Reports getting left chest/flank pain when she reaches with her left arm. Describes it as a cramp. Denies left arm or left breast swelling. She exercises regularly, and eats healthy.  MEDICAL HISTORY:  Past Medical History:  Diagnosis Date  . Anemia   . Anxiety   . Asthma   . GERD (gastroesophageal reflux disease)   . Papilloma of left breast   . PONV (postoperative nausea and vomiting)  SURGICAL HISTORY: Past Surgical History:  Procedure Laterality Date  . BREAST LUMPECTOMY WITH RADIOACTIVE SEED LOCALIZATION Left 02/14/2015   Procedure: BREAST LUMPECTOMY WITH RADIOACTIVE SEED LOCALIZATION;  Surgeon: Erroll Luna, MD;  Location: [facility];  Service: General;  Laterality: Left;  . BREAST SURGERY  2010   LEFT  . CHOLECYSTECTOMY  2001  . KNEE ARTHROSCOPY Left 1999/1993  . NASAL PASSAGE  2000  . NASAL SEPTUM SURGERY      SOCIAL HISTORY: Social History   Social History  . Marital status: Single    Spouse name: N/A  . Number of children: 0  .  Years of education: N/A   Occupational History  . [employer] Manager    Social History Main Topics  . Smoking status: Never Smoker  . Smokeless tobacco: Never Used  . Alcohol use No  . Drug use: No  . Sexual activity: Yes    Birth control/ protection: Post-menopausal   Other Topics Concern  . Not on file   Social History Narrative  . No narrative on file    FAMILY HISTORY: Family History  Problem Relation Age of Onset  . Breast cancer  33    niece  . Prostate cancer Brother 18  . Prostate cancer Brother 53  . Prostate cancer Maternal Grandfather 25.    ALLERGIES:  has No Known Allergies.  MEDICATIONS:  Current Outpatient Prescriptions  Medication Sig Dispense Refill  . albuterol (PROVENTIL HFA;VENTOLIN HFA) 108 (90 BASE) MCG/ACT inhaler Inhale into the lungs every 6 (six) hours as needed for wheezing or shortness of breath.    . anastrozole (ARIMIDEX) 1 MG tablet Take 1 tablet (1 mg total) by mouth daily. 90 tablet 3  . cetirizine (ZYRTEC) 10 MG tablet Take 10 mg by mouth daily.      . cholecalciferol (VITAMIN D) 1000 units tablet Take 2,000 Units by mouth daily.    . DULERA 100-5 MCG/ACT AERO Inhale 2 puffs into the lungs 2 (two) times daily.  11  . fluticasone (FLOVENT HFA) 110 MCG/ACT inhaler Inhale 1 puff into the lungs 2 (two) times daily.      . hyaluronate sodium (RADIAPLEXRX) GEL Apply 1 application topically once.    . non-metallic deodorant Jethro Poling) MISC Apply 1 application topically daily as needed. Reported on 02/01/2016    . omeprazole (PRILOSEC) 20 MG capsule Take 2 capsules (40 mg total) by mouth daily. 30 capsule 0  . promethazine-codeine (PHENERGAN WITH CODEINE) 6.25-10 MG/5ML syrup TAKE 5 MLS EVERY 6 HOURS AS NEEDED FOR SEVERE COUGH  0  . vitamin C (ASCORBIC ACID) 500 MG tablet Take 500 mg by mouth daily.     No current facility-administered medications for this visit.     REVIEW OF SYSTEMS:   Constitutional: Denies fevers, chills or abnormal night sweats  (+) hot flashes Eyes: Denies blurriness of vision, double vision or watery eyes Ears, nose, mouth, throat, and face: Denies mucositis or sore throat Respiratory: Denies cough, dyspnea or wheezes Cardiovascular: Denies palpitation, chest discomfort or lower extremity swelling Gastrointestinal:  Denies nausea, heartburn or change in bowel habits Skin: Denies abnormal skin rashes Lymphatics: Denies new lymphadenopathy or easy bruising Neurological:Denies numbness, tingling or new weaknesses Behavioral/Psych: Mood is stable, no new changes  All other systems were reviewed with the patient and are negative.  PHYSICAL EXAMINATION: ECOG PERFORMANCE STATUS: 0 - Asymptomatic  Vitals:   01/06/17 0926  BP: 126/80  Pulse: 64  Resp: 18  Temp: 98.4 F (36.9 C)  Filed Weights   01/06/17 0926  Weight: 154 lb 11.2 oz (70.2 kg)    GENERAL:alert, no distress and comfortable SKIN: skin color, texture, turgor are normal, no rashes or significant lesions EYES: normal, conjunctiva are pink and non-injected, sclera clear OROPHARYNX:no exudate, no erythema and lips, buccal mucosa, and tongue normal  NECK: supple, thyroid normal size, non-tender, without nodularity LYMPH:  no palpable lymphadenopathy in the cervical, axillary or inguinal LUNGS: clear to auscultation and percussion with normal breathing effort HEART: regular rate & rhythm and no murmurs and no lower extremity edema ABDOMEN:abdomen soft, non-tender and normal bowel sounds Musculoskeletal:no cyanosis of digits and no clubbing  PSYCH: alert & oriented x 3 with fluent speech NEURO: no focal motor/sensory deficits Breasts: Breast inspection showed them to be symmetrical with no nipple discharge. The surgical scar at the LOQ left breast inferior to the nipple healed well, but is tender to palpation. Palpation of the breasts and axilla revealed no obvious mass that I could appreciate.  LABORATORY DATA:  I have reviewed the data as  listed  CBC Latest Ref Rng & Units 01/06/2017 06/03/2016 02/01/2016  WBC 3.9 - 10.3 10e3/uL 7.2 7.8 12.8(H)  Hemoglobin 11.6 - 15.9 g/dL 12.8 13.6 12.8  Hematocrit 34.8 - 46.6 % 36.0 39.7 35.9  Platelets 145 - 400 10e3/uL 286 275 300    CMP Latest Ref Rng & Units 01/06/2017 06/03/2016 02/01/2016  Glucose 70 - 140 mg/dl 96 92 143(H)  BUN 7.0 - 26.0 mg/dL 12.2 15.9 13.9  Creatinine 0.6 - 1.1 mg/dL 0.9 1.0 1.0  Sodium 136 - 145 mEq/L 142 142 140  Potassium 3.5 - 5.1 mEq/L 3.6 3.9 3.9  Chloride 96 - 112 mEq/L - - -  CO2 22 - 29 mEq/L 28 28 23   Calcium 8.4 - 10.4 mg/dL 10.0 10.0 9.7  Total Protein 6.4 - 8.3 g/dL 7.5 8.1 8.0  Total Bilirubin 0.20 - 1.20 mg/dL 0.28 0.52 <0.30  Alkaline Phos 40 - 150 U/L 111 102 109  AST 5 - 34 U/L 16 21 16   ALT 0 - 55 U/L 15 18 16      PATHOLOGY REPORT: Diagnosis 12/19/2014 Breast, left, needle core biopsy, mass, 6 o'clock, slightly outer - DUCTAL PAPILLOMA. - SEE MICROSCOPIC DESCRIPTION.  Diagnosis 02/14/2015 Breast, lumpectomy, left - LOW GRADE DUCTAL CARCINOMA IN SITU INVOLVING INTRADUCTAL PAPILLOMA, SEE COMMENT. - SURGICAL MARGINS NEGATIVE FOR TUMOR - PREVIOUS BIOPSY SITE IDENTIFIED - SEE TUMOR SYNOPTIC TEMPLATE BELOW.  Specimen, including laterality: Left breast Procedure (include lymph node sampling sentinel-non-sentinel): Lumpectomy without lymph node sampling Grade of carcinoma: I of III Necrosis: Absent Estimated tumor size: 84mm See comment Treatment effect: None If present, treatment effect in breast tissue, lymph nodes or both: N/A Distance to closest margin: 2 mm (anterior) and 1.5 mm (posterior), see comment. If margin positive, focally or broadly: N/A Breast prognostic profile: Pending and will be reported in an addendum. Lymph nodes: Examined: 0 Sentinel 0 Non-sentinel 0 Total Lymph nodes with metastasis: N/A Isolated tumor cells (< 0.2 mm): N/A Micrometastasis ( > 0.2 mm and < 2.0 mm): N/A Macrometastasis (> 2.0 mm): N/A Extranodal  extension: N/A PROGNOSTIC INDICATORS - ACIS TNM: pTis, pNX Results: IMMUNOHISTOCHEMICAL AND MORPHOMETRIC ANALYSIS BY THE AUTOMATED CELLULAR IMAGING SYSTEM (ACIS) Estrogen Receptor: 100%, POSITIVE, STRONG STAINING INTENSITY Progesterone Receptor: 100%, POSITIVE, STRONG STAINING INTENSITY  RADIOGRAPHIC STUDIES: I have personally reviewed the radiological images as listed and agreed with the findings in the report.  MM DIAG BREAST TOMO BILATERAL 12/26/2016 IMPRESSION: No  findings worrisome for recurrent tumor or developing malignancy. Stable breast parenchymal pattern. BI-RADS CATEGORY  1: Negative.  MAMMOGRAM 12/26/2015 IMPRESSION: No evidence of malignancy. Benign postsurgical scarring on the left.  DEXA 08/08/2015 ASSESSMENT: Patient's diagnostic category is NORMAL by WHO Criteria.  Bone density scan 08/08/2015 ASSESSMENT: Patient's diagnostic category is NORMAL by WHO Criteria.  ASSESSMENT & PLAN: 56 y.o. African-American postmenopausal female   1. DCIS of left breast, ER+/PR+ -The patient had early stage disease. She is considered cured of disease.  -Any form of adjuvant treatment is for prevention of disease recurrence.  -she is on anastrozole for breast cancer prevention, is tolerating anastrozole well, we'll continue, plan for a total 5 years.  -We discussed breast cancer surveillance, she will have annual mammogram, and a regular follow-up with [place].  -Her last mammogram was normal. Lab results reviewed with her, which were unremarkable, unremarkable CBC and CMP, no evidence of recurrence. -I encouraged her to continue healthy diet and regular exercise, which promotes overall health and reduce risk of breast cancer, she is compliant on that.  2. Genetic -Her maternal niece had breast cancer at age of 63, 2 brothers has prostate cancer. -Her genetic test was negative, except unknown significance variant in [facility] 1 gene.  3. Bone health -Her bone density scan in August 2016  was normal -Repeat bone density scan in August 2018. -I encouraged her to continue calcium and vitamin D  4. Vitamin D deficiency -She was found to have low vitamin D, treated with high-dose vitamin D by her primary care physician -She is currently on vitamin D 1000 units a day, will follow up with her primary care physician.  5. Left chest pain -The patient reports she has this pain when she over extends her left arm or move it a certain way. This is likely related to her previous breast surgery -I offered physical therapy, but the patient states she'll manage it on her own with exercise.  Plan -Contnue anastrozole. I refilled today. -Repeat bone density scan in August 2018. -Return to clinic in 6 months with labs.  All questions were answered. The patient knows to call the clinic with any problems, questions or concerns.  I spent 15 minutes counseling the patient face to face. The total time spent in the appointment was 20 minutes and more than 50% was on counseling.     Truitt Merle, MD 01/06/2017 10:21 AM   This document serves as a record of services personally performed by Truitt Merle, MD. It was created on her behalf by Darcus Austin, a trained medical scribe. The creation of this record is based on the scribe's personal observations and the provider's statements to them. This document has been checked and approved by the attending provider.

## 2017-01-06 ENCOUNTER — Other Ambulatory Visit (HOSPITAL_BASED_OUTPATIENT_CLINIC_OR_DEPARTMENT_OTHER): Payer: Managed Care, Other (non HMO)

## 2017-01-06 ENCOUNTER — Ambulatory Visit (HOSPITAL_BASED_OUTPATIENT_CLINIC_OR_DEPARTMENT_OTHER): Payer: Managed Care, Other (non HMO) | Admitting: Hematology

## 2017-01-06 ENCOUNTER — Encounter: Payer: Self-pay | Admitting: Hematology

## 2017-01-06 VITALS — BP 126/80 | HR 64 | Temp 98.4°F | Resp 18 | Wt 154.7 lb

## 2017-01-06 DIAGNOSIS — E559 Vitamin D deficiency, unspecified: Secondary | ICD-10-CM

## 2017-01-06 DIAGNOSIS — C50512 Malignant neoplasm of lower-outer quadrant of left female breast: Secondary | ICD-10-CM

## 2017-01-06 DIAGNOSIS — Z170 Estrogen receptor positive status [ER+]: Secondary | ICD-10-CM | POA: Diagnosis not present

## 2017-01-06 DIAGNOSIS — D0512 Intraductal carcinoma in situ of left breast: Secondary | ICD-10-CM

## 2017-01-06 DIAGNOSIS — Z79811 Long term (current) use of aromatase inhibitors: Secondary | ICD-10-CM

## 2017-01-06 DIAGNOSIS — R079 Chest pain, unspecified: Secondary | ICD-10-CM | POA: Diagnosis not present

## 2017-01-06 LAB — COMPREHENSIVE METABOLIC PANEL
ALT: 15 U/L (ref 0–55)
AST: 16 U/L (ref 5–34)
Albumin: 3.9 g/dL (ref 3.5–5.0)
Alkaline Phosphatase: 111 U/L (ref 40–150)
Anion Gap: 8 mEq/L (ref 3–11)
BUN: 12.2 mg/dL (ref 7.0–26.0)
CO2: 28 mEq/L (ref 22–29)
Calcium: 10 mg/dL (ref 8.4–10.4)
Chloride: 106 mEq/L (ref 98–109)
Creatinine: 0.9 mg/dL (ref 0.6–1.1)
EGFR: 82 mL/min/{1.73_m2} — ABNORMAL LOW (ref >90)
Glucose: 96 mg/dl (ref 70–140)
Potassium: 3.6 mEq/L (ref 3.5–5.1)
Sodium: 142 mEq/L (ref 136–145)
Total Bilirubin: 0.28 mg/dL (ref 0.20–1.20)
Total Protein: 7.5 g/dL (ref 6.4–8.3)

## 2017-01-06 LAB — CBC WITH DIFFERENTIAL/PLATELET
BASO%: 0.3 % (ref 0.0–2.0)
Basophils Absolute: 0 10*3/uL (ref 0.0–0.1)
EOS%: 8.3 % — ABNORMAL HIGH (ref 0.0–7.0)
Eosinophils Absolute: 0.6 10*3/uL — ABNORMAL HIGH (ref 0.0–0.5)
HCT: 36 % (ref 34.8–46.6)
HGB: 12.8 g/dL (ref 11.6–15.9)
LYMPH%: 21.2 % (ref 14.0–49.7)
MCH: 27.4 pg (ref 25.1–34.0)
MCHC: 35.6 g/dL (ref 31.5–36.0)
MCV: 77.1 fL — ABNORMAL LOW (ref 79.5–101.0)
MONO#: 0.6 10*3/uL (ref 0.1–0.9)
MONO%: 8.3 % (ref 0.0–14.0)
NEUT#: 4.5 10*3/uL (ref 1.5–6.5)
NEUT%: 61.9 % (ref 38.4–76.8)
Platelets: 286 10*3/uL (ref 145–400)
RBC: 4.67 10*6/uL (ref 3.70–5.45)
RDW: 13.6 % (ref 11.2–14.5)
WBC: 7.2 10*3/uL (ref 3.9–10.3)
lymph#: 1.5 10*3/uL (ref 0.9–3.3)

## 2017-01-06 MED ORDER — ANASTROZOLE 1 MG PO TABS: 1.0000 mg | ORAL_TABLET | Freq: Every day | ORAL | 3 refills | Status: DC

## 2017-01-07 ENCOUNTER — Telehealth: Payer: Self-pay | Admitting: Hematology

## 2017-01-07 NOTE — Telephone Encounter (Signed)
Not able to reach patient or leave message re July appointments. Schedule mailed.

## 2017-07-07 NOTE — Progress Notes (Signed)
[place]  [phone] [phone]  Clinic Follow Up Note   Patient Care Team: [name], MD as PCP - General (Family Medicine) 07/08/2017  CHIEF COMPLAINTS:  Follow up left breast DCIS  Oncology History   Breast cancer of lower-outer quadrant of left female breast   Staging form: Breast, AJCC 7th Edition     Clinical: Stage 0 (Tis (DCIS), N0, M0) - Unsigned       Ductal carcinoma in situ (DCIS) of left breast   12/19/2014 Pathology Results    Ductal papilloma      12/19/2014 Mammogram    2 new solid masses (51mm, 61mm) in the 6:00 position of the left breast.      02/14/2015 Initial Diagnosis    Breast cancer of lower-outer quadrant of left female breast      02/14/2015 Surgery    Left breast lumpectomy, negative margins.       02/14/2015 Pathologic Stage    Low-grade ductal carcinoma in situ involving intraductal papilloma, tumor 2 mm, ER 100%, PR 100% positive.      04/10/2015 - 05/26/2015 Radiation Therapy    adjuvant breast radiation       06/30/2015 -  Anti-estrogen oral therapy    anastrzaole 1mg  dialy       12/26/2015 Imaging    MM DIAG BREAST TOMO BILATERAL 12/26/2015 IMPRESSION: No evidence of malignancy. Benign postsurgical scarring on the left. BI-RADS CATEGORY  2: Benign.      12/26/2016 Imaging    MM DIAG BREAST TOMO BILATERAL 12/26/2016 IMPRESSION: No findings worrisome for recurrent tumor or developing malignancy. Stable breast parenchymal pattern. BI-RADS CATEGORY  1: Negative.       HISTORY OF PRESENTING ILLNESS:  Cynthia Ford 56 y.o. female is here because of recently diagnosed left breast DCIS. She presented with her female partner to the clinic today.  She had papilloma in 2010 and had lumpectomy. She has been followed up with annual mammogram. Her mammogram on 12/19/2014 showed a 2 new solid mass in the 6:00 position of the left breast. She underwent biopsy which showed ductal papilloma.  She subsequently underwent left breast lumpectomy on 02/14/2015, and the surgical path showed DCIS and intraductal papilloma.  She has recovered very well from surgery. She has minimal discomfort at the surgical site, has good appetite and energy level. No other complaints.  In terms of breast cancer risk profile:  She menarched at early age of 81 and went to menopause at age 21 She had 0 pregnancy.  She never received birth control pills for approximately .  She was  never exposed to fertility medications or hormone replacement therapy.  She has  family history of Breast cancer in her niece   CURRENT THERAPY: anastrozole 1mg  daily started on 06/30/2015  INTERIM HISTORY: Cynthia Ford returns for follow-up as scheduled.  She is doing well overall. She still has mild pain at left breast when she try to reach out with left arm, no significant left breast or arm edema. She has mild hot flush, manageable, no other complaints. She is compliant with anastrozole, tolerating well overall.   MEDICAL HISTORY:  Past Medical History:  Diagnosis Date  . Anemia   . Anxiety   . Asthma   . GERD (gastroesophageal reflux disease)   . Papilloma of left breast   . PONV (postoperative nausea and vomiting)     SURGICAL HISTORY: Past Surgical History:  Procedure Laterality Date  . BREAST LUMPECTOMY WITH RADIOACTIVE SEED LOCALIZATION  Left 02/14/2015   Procedure: BREAST LUMPECTOMY WITH RADIOACTIVE SEED LOCALIZATION;  Surgeon: Erroll Luna, MD;  Location: [facility];  Service: General;  Laterality: Left;  . BREAST SURGERY  2010   LEFT  . CHOLECYSTECTOMY  2001  . KNEE ARTHROSCOPY Left 1999/1993  . NASAL PASSAGE  2000  . NASAL SEPTUM SURGERY      SOCIAL HISTORY: Social History   Social History  . Marital status: Single    Spouse name: N/A  . Number of children: 0  . Years of education: N/A   Occupational History  . [employer] Manager    Social History Main Topics  . Smoking status:  Never Smoker  . Smokeless tobacco: Never Used  . Alcohol use No  . Drug use: No  . Sexual activity: Yes    Birth control/ protection: Post-menopausal   Other Topics Concern  . Not on file   Social History Narrative  . No narrative on file    FAMILY HISTORY: Family History  Problem Relation Age of Onset  . Breast cancer Unknown 40       niece  . Prostate cancer Brother 41  . Prostate cancer Brother 15  . Prostate cancer Maternal Grandfather 85.    ALLERGIES:  has No Known Allergies.  MEDICATIONS:  Current Outpatient Prescriptions  Medication Sig Dispense Refill  . albuterol (PROVENTIL HFA;VENTOLIN HFA) 108 (90 BASE) MCG/ACT inhaler Inhale into the lungs every 6 (six) hours as needed for wheezing or shortness of breath.    . anastrozole (ARIMIDEX) 1 MG tablet Take 1 tablet (1 mg total) by mouth daily. 90 tablet 3  . cetirizine (ZYRTEC) 10 MG tablet Take 10 mg by mouth daily.      . cholecalciferol (VITAMIN D) 1000 units tablet Take 2,000 Units by mouth daily.    . DULERA 100-5 MCG/ACT AERO Inhale 2 puffs into the lungs 2 (two) times daily.  11  . fluticasone (FLOVENT HFA) 110 MCG/ACT inhaler Inhale 1 puff into the lungs 2 (two) times daily.      . hyaluronate sodium (RADIAPLEXRX) GEL Apply 1 application topically once.    . non-metallic deodorant Jethro Poling) MISC Apply 1 application topically daily as needed. Reported on 02/01/2016    . omeprazole (PRILOSEC) 20 MG capsule Take 2 capsules (40 mg total) by mouth daily. 30 capsule 0  . promethazine-codeine (PHENERGAN WITH CODEINE) 6.25-10 MG/5ML syrup TAKE 5 MLS EVERY 6 HOURS AS NEEDED FOR SEVERE COUGH  0  . vitamin C (ASCORBIC ACID) 500 MG tablet Take 500 mg by mouth daily.     No current facility-administered medications for this visit.     REVIEW OF SYSTEMS:   Constitutional: Denies fevers, chills or abnormal night sweats (+) hot flashes Eyes: Denies blurriness of vision, double vision or watery eyes Ears, nose, mouth, throat,  and face: Denies mucositis or sore throat Respiratory: Denies cough, dyspnea or wheezes Cardiovascular: Denies palpitation, chest discomfort or lower extremity swelling Gastrointestinal:  Denies nausea, heartburn or change in bowel habits Skin: Denies abnormal skin rashes Lymphatics: Denies new lymphadenopathy or easy bruising Neurological:Denies numbness, tingling or new weaknesses Behavioral/Psych: Mood is stable, no new changes  All other systems were reviewed with the patient and are negative.  PHYSICAL EXAMINATION:  ECOG PERFORMANCE STATUS: 0 - Asymptomatic  Vitals:   07/08/17 1234  BP: 120/76  Pulse: 71  Resp: 20  Temp: 98.5 F (36.9 C)   Filed Weights   07/08/17 1234  Weight: 155 lb 1.6 oz (  70.4 kg)    GENERAL:alert, no distress and comfortable SKIN: skin color, texture, turgor are normal, no rashes or significant lesions EYES: normal, conjunctiva are pink and non-injected, sclera clear OROPHARYNX:no exudate, no erythema and lips, buccal mucosa, and tongue normal  NECK: supple, thyroid normal size, non-tender, without nodularity LYMPH:  no palpable lymphadenopathy in the cervical, axillary or inguinal LUNGS: clear to auscultation and percussion with normal breathing effort HEART: regular rate & rhythm and no murmurs and no lower extremity edema ABDOMEN:abdomen soft, non-tender and normal bowel sounds Musculoskeletal:no cyanosis of digits and no clubbing  PSYCH: alert & oriented x 3 with fluent speech NEURO: no focal motor/sensory deficits Breasts: Breast inspection showed them to be symmetrical with no nipple discharge. The surgical scar at the LOQ left breast inferior to the nipple healed well, but is tender to palpation. Palpation of the breasts and axilla revealed no obvious mass that I could appreciate.  LABORATORY DATA:  I have reviewed the data as listed  CBC Latest Ref Rng & Units 07/08/2017 01/06/2017 06/03/2016  WBC 3.9 - 10.3 10e3/uL 7.9 7.2 7.8  Hemoglobin  11.6 - 15.9 g/dL 13.3 12.8 13.6  Hematocrit 34.8 - 46.6 % 38.3 36.0 39.7  Platelets 145 - 400 10e3/uL 293 286 275    CMP Latest Ref Rng & Units 01/06/2017 06/03/2016 02/01/2016  Glucose 70 - 140 mg/dl 96 92 143(H)  BUN 7.0 - 26.0 mg/dL 12.2 15.9 13.9  Creatinine 0.6 - 1.1 mg/dL 0.9 1.0 1.0  Sodium 136 - 145 mEq/L 142 142 140  Potassium 3.5 - 5.1 mEq/L 3.6 3.9 3.9  Chloride 96 - 112 mEq/L - - -  CO2 22 - 29 mEq/L 28 28 23   Calcium 8.4 - 10.4 mg/dL 10.0 10.0 9.7  Total Protein 6.4 - 8.3 g/dL 7.5 8.1 8.0  Total Bilirubin 0.20 - 1.20 mg/dL 0.28 0.52 <0.30  Alkaline Phos 40 - 150 U/L 111 102 109  AST 5 - 34 U/L 16 21 16   ALT 0 - 55 U/L 15 18 16      PATHOLOGY REPORT: Diagnosis 12/19/2014 Breast, left, needle core biopsy, mass, 6 o'clock, slightly outer - DUCTAL PAPILLOMA. - SEE MICROSCOPIC DESCRIPTION.  Diagnosis 02/14/2015 Breast, lumpectomy, left - LOW GRADE DUCTAL CARCINOMA IN SITU INVOLVING INTRADUCTAL PAPILLOMA, SEE COMMENT. - SURGICAL MARGINS NEGATIVE FOR TUMOR - PREVIOUS BIOPSY SITE IDENTIFIED - SEE TUMOR SYNOPTIC TEMPLATE BELOW.  Specimen, including laterality: Left breast Procedure (include lymph node sampling sentinel-non-sentinel): Lumpectomy without lymph node sampling Grade of carcinoma: I of III Necrosis: Absent Estimated tumor size: 17mm See comment Treatment effect: None If present, treatment effect in breast tissue, lymph nodes or both: N/A Distance to closest margin: 2 mm (anterior) and 1.5 mm (posterior), see comment. If margin positive, focally or broadly: N/A Breast prognostic profile: Pending and will be reported in an addendum. Lymph nodes: Examined: 0 Sentinel 0 Non-sentinel 0 Total Lymph nodes with metastasis: N/A Isolated tumor cells (< 0.2 mm): N/A Micrometastasis ( > 0.2 mm and < 2.0 mm): N/A Macrometastasis (> 2.0 mm): N/A Extranodal extension: N/A PROGNOSTIC INDICATORS - ACIS TNM: pTis, pNX Results: IMMUNOHISTOCHEMICAL AND MORPHOMETRIC ANALYSIS  BY THE AUTOMATED CELLULAR IMAGING SYSTEM (ACIS) Estrogen Receptor: 100%, POSITIVE, STRONG STAINING INTENSITY Progesterone Receptor: 100%, POSITIVE, STRONG STAINING INTENSITY  RADIOGRAPHIC STUDIES: I have personally reviewed the radiological images as listed and agreed with the findings in the report.  MM DIAG BREAST TOMO BILATERAL 12/26/2016 IMPRESSION: No findings worrisome for recurrent tumor or developing malignancy. Stable breast parenchymal pattern.  BI-RADS CATEGORY  1: Negative.  MAMMOGRAM 12/26/2015 IMPRESSION: No evidence of malignancy. Benign postsurgical scarring on the left.  DEXA 08/08/2015 ASSESSMENT: Patient's diagnostic category is NORMAL by WHO Criteria.  Bone density scan 08/08/2015 ASSESSMENT: Patient's diagnostic category is NORMAL by WHO Criteria.  ASSESSMENT & PLAN: 57 y.o. African-American postmenopausal female   1. DCIS of left breast, ER+/PR+ -The patient had early stage disease. She is considered cured of disease.  -Any form of adjuvant treatment is for prevention of disease recurrence.  -she is on anastrozole for breast cancer prevention, is tolerating anastrozole well, we'll continue, plan for a total 5 years.  -Continue breast cancer surveillance with annual mammogram, and a regular follow-up with [place].  -She is clinically doing very well. Exam was normal. Lab results reviewed with her,  unremarkable CBC and CMP, no clinical concern of breast cancer. -I encouraged her to continue healthy diet and regular exercise, which promotes overall health and reduce risk of breast cancer, she is compliant on that.   2. Genetic -Her maternal niece had breast cancer at age of 29, 2 brothers has prostate cancer. -Her genetic test was negative, except unknown significance variant in [facility] 1 gene.  3. Bone health -Her bone density scan in August 2016 was normal -Repeat bone density scan in August 2018. -I encouraged her to continue calcium and vitamin D -We discussed  anastrozole many weak her bone.  4. Vitamin D deficiency -She was found to have low vitamin D, treated with high-dose vitamin D by her primary care physician -She is currently on vitamin D 1000 units a day, will follow up with her primary care physician.  5. Left chest pain -The patient reports she has this pain when she over extends her left arm or move it a certain way. This is likely related to her previous breast surgery -I offered physical therapy, but the patient states she'll manage it on her own with exercise.  Plan -Contnue anastrozole. -Repeat bone density scan in August 2018. Will schedule for her today  -Return to clinic in 6 months with labs.  All questions were answered. The patient knows to call the clinic with any problems, questions or concerns.  I spent 15 minutes counseling the patient face to face. The total time spent in the appointment was 20 minutes and more than 50% was on counseling.     Truitt Merle, MD 07/08/2017 12:51 PM   This document serves as a record of services personally performed by Truitt Merle, MD. It was created on her behalf by Brandt Loosen, a trained medical scribe. The creation of this record is based on the scribe's personal observations and the provider's statements to them. This document has been checked and approved by the attending provider.

## 2017-07-08 ENCOUNTER — Other Ambulatory Visit (HOSPITAL_BASED_OUTPATIENT_CLINIC_OR_DEPARTMENT_OTHER): Payer: Managed Care, Other (non HMO)

## 2017-07-08 ENCOUNTER — Ambulatory Visit (HOSPITAL_BASED_OUTPATIENT_CLINIC_OR_DEPARTMENT_OTHER): Payer: Managed Care, Other (non HMO) | Admitting: Hematology

## 2017-07-08 ENCOUNTER — Encounter: Payer: Self-pay | Admitting: Hematology

## 2017-07-08 ENCOUNTER — Telehealth: Payer: Self-pay | Admitting: Hematology

## 2017-07-08 VITALS — BP 120/76 | HR 71 | Temp 98.5°F | Resp 20 | Ht 64.0 in | Wt 155.1 lb

## 2017-07-08 DIAGNOSIS — R079 Chest pain, unspecified: Secondary | ICD-10-CM | POA: Diagnosis not present

## 2017-07-08 DIAGNOSIS — D0512 Intraductal carcinoma in situ of left breast: Secondary | ICD-10-CM

## 2017-07-08 DIAGNOSIS — Z170 Estrogen receptor positive status [ER+]: Secondary | ICD-10-CM | POA: Diagnosis not present

## 2017-07-08 DIAGNOSIS — Z79811 Long term (current) use of aromatase inhibitors: Secondary | ICD-10-CM | POA: Diagnosis not present

## 2017-07-08 DIAGNOSIS — E559 Vitamin D deficiency, unspecified: Secondary | ICD-10-CM

## 2017-07-08 LAB — COMPREHENSIVE METABOLIC PANEL
ALT: 17 U/L (ref 0–55)
AST: 19 U/L (ref 5–34)
Albumin: 4 g/dL (ref 3.5–5.0)
Alkaline Phosphatase: 120 U/L (ref 40–150)
Anion Gap: 9 mEq/L (ref 3–11)
BUN: 10.3 mg/dL (ref 7.0–26.0)
CO2: 27 mEq/L (ref 22–29)
Calcium: 10 mg/dL (ref 8.4–10.4)
Chloride: 103 mEq/L (ref 98–109)
Creatinine: 0.9 mg/dL (ref 0.6–1.1)
EGFR: 87 mL/min/{1.73_m2} — ABNORMAL LOW (ref >90)
Glucose: 86 mg/dl (ref 70–140)
Potassium: 3.6 mEq/L (ref 3.5–5.1)
Sodium: 139 mEq/L (ref 136–145)
Total Bilirubin: 0.38 mg/dL (ref 0.20–1.20)
Total Protein: 7.7 g/dL (ref 6.4–8.3)

## 2017-07-08 LAB — CBC WITH DIFFERENTIAL/PLATELET
BASO%: 0.6 % (ref 0.0–2.0)
Basophils Absolute: 0 10*3/uL (ref 0.0–0.1)
EOS%: 8.4 % — ABNORMAL HIGH (ref 0.0–7.0)
Eosinophils Absolute: 0.7 10*3/uL — ABNORMAL HIGH (ref 0.0–0.5)
HCT: 38.3 % (ref 34.8–46.6)
HGB: 13.3 g/dL (ref 11.6–15.9)
LYMPH%: 19.7 % (ref 14.0–49.7)
MCH: 27.1 pg (ref 25.1–34.0)
MCHC: 34.6 g/dL (ref 31.5–36.0)
MCV: 78.4 fL — ABNORMAL LOW (ref 79.5–101.0)
MONO#: 0.6 10*3/uL (ref 0.1–0.9)
MONO%: 8 % (ref 0.0–14.0)
NEUT#: 5 10*3/uL (ref 1.5–6.5)
NEUT%: 63.3 % (ref 38.4–76.8)
Platelets: 293 10*3/uL (ref 145–400)
RBC: 4.89 10*6/uL (ref 3.70–5.45)
RDW: 13.6 % (ref 11.2–14.5)
WBC: 7.9 10*3/uL (ref 3.9–10.3)
lymph#: 1.6 10*3/uL (ref 0.9–3.3)

## 2017-07-08 NOTE — Telephone Encounter (Signed)
Scheduled appt per 7/10 los - Gave patient AVS and calender per los. No Bone density order in yet - [facility] to call patient if ordered for [facility]

## 2017-07-13 ENCOUNTER — Encounter: Payer: Self-pay | Admitting: Hematology

## 2017-11-27 ENCOUNTER — Other Ambulatory Visit: Payer: Self-pay | Admitting: Hematology

## 2017-11-27 ENCOUNTER — Telehealth: Payer: Self-pay | Admitting: *Deleted

## 2017-11-27 DIAGNOSIS — D0512 Intraductal carcinoma in situ of left breast: Secondary | ICD-10-CM

## 2017-11-27 DIAGNOSIS — E2839 Other primary ovarian failure: Secondary | ICD-10-CM

## 2017-11-27 NOTE — Telephone Encounter (Signed)
DEXA and mammogram ordered, to be done at the end of Dec, let pt know and schedule with breast center, thanks   Truitt Merle MD

## 2017-11-27 NOTE — Telephone Encounter (Signed)
Ms Langworthy left a message stating she was supposed to have a mammogram and bone density scheduled. She called the [facility] and they do not have orders.

## 2018-01-01 ENCOUNTER — Other Ambulatory Visit: Payer: Self-pay | Admitting: Hematology

## 2018-01-01 ENCOUNTER — Ambulatory Visit
Admission: RE | Admit: 2018-01-01 | Discharge: 2018-01-01 | Disposition: A | Discharge status: Home / Self Care | Payer: 59 | Source: Ambulatory Visit | Attending: Hematology | Admitting: Hematology

## 2018-01-01 ENCOUNTER — Encounter: Payer: Self-pay | Admitting: Radiology

## 2018-01-01 ENCOUNTER — Other Ambulatory Visit: Payer: Self-pay | Admitting: Diagnostic Radiology

## 2018-01-01 ENCOUNTER — Ambulatory Visit
Admission: RE | Admit: 2018-01-01 | Discharge: 2018-01-01 | Disposition: A | Discharge status: Home / Self Care | Payer: Managed Care, Other (non HMO) | Source: Ambulatory Visit | Attending: Hematology | Admitting: Hematology

## 2018-01-01 DIAGNOSIS — D0512 Intraductal carcinoma in situ of left breast: Secondary | ICD-10-CM

## 2018-01-01 DIAGNOSIS — N6310 Unspecified lump in the right breast, unspecified quadrant: Secondary | ICD-10-CM

## 2018-01-01 DIAGNOSIS — E2839 Other primary ovarian failure: Secondary | ICD-10-CM

## 2018-01-01 DIAGNOSIS — N6489 Other specified disorders of breast: Secondary | ICD-10-CM

## 2018-01-01 HISTORY — DX: Personal history of irradiation: Z92.3

## 2018-01-02 ENCOUNTER — Telehealth: Payer: Self-pay | Admitting: *Deleted

## 2018-01-02 NOTE — Telephone Encounter (Signed)
Received call from pt stating that she has an appt with Dr Burr Medico next Thursday & has had her mammogram & there was an area of concern in the opposite breast & a biopsy is planned for Wed.  She wants to know if she should keep appt for Thurs or r/s for later.  Message to Dr Burr Medico.

## 2018-01-02 NOTE — Telephone Encounter (Signed)
Per Dr Burr Medico, pt should move appt to week of 01/02/18.  Message sent to scheduler.

## 2018-01-05 ENCOUNTER — Telehealth: Payer: Self-pay | Admitting: Hematology

## 2018-01-05 NOTE — Telephone Encounter (Signed)
Scheduled appt per 1/4 sch message - Patient is aware of apt date and time.

## 2018-01-07 ENCOUNTER — Other Ambulatory Visit: Payer: Self-pay | Admitting: Hematology

## 2018-01-07 ENCOUNTER — Ambulatory Visit
Admission: RE | Admit: 2018-01-07 | Discharge: 2018-01-07 | Disposition: A | Discharge status: Home / Self Care | Payer: 59 | Source: Ambulatory Visit | Attending: Hematology | Admitting: Hematology

## 2018-01-07 DIAGNOSIS — N6489 Other specified disorders of breast: Secondary | ICD-10-CM

## 2018-01-08 ENCOUNTER — Other Ambulatory Visit: Payer: Managed Care, Other (non HMO)

## 2018-01-08 ENCOUNTER — Ambulatory Visit: Payer: Managed Care, Other (non HMO) | Admitting: Hematology

## 2018-01-09 NOTE — Progress Notes (Signed)
[place]  [phone] Fax:([phone]  Clinic Follow Up Note   Patient Care Team: [name], MD as PCP - General (Family Medicine) 01/12/2018  CHIEF COMPLAINTS:  Follow up left breast DCIS  Oncology History   Breast cancer of lower-outer quadrant of left female breast   Staging form: Breast, AJCC 7th Edition     Clinical: Stage 0 (Tis (DCIS), N0, M0) - Unsigned       Ductal carcinoma in situ (DCIS) of left breast   12/19/2014 Pathology Results    Ductal papilloma      12/19/2014 Mammogram    2 new solid masses (65mm, 27mm) in the 6:00 position of the left breast.      02/14/2015 Initial Diagnosis    Breast cancer of lower-outer quadrant of left female breast      02/14/2015 Surgery    Left breast lumpectomy, negative margins.       02/14/2015 Pathologic Stage    Low-grade ductal carcinoma in situ involving intraductal papilloma, tumor 2 mm, ER 100%, PR 100% positive.      04/10/2015 - 05/26/2015 Radiation Therapy    adjuvant breast radiation       06/30/2015 -  Anti-estrogen oral therapy    anastrzaole 1mg  dialy       12/26/2015 Imaging    MM DIAG BREAST TOMO BILATERAL 12/26/2015 IMPRESSION: No evidence of malignancy. Benign postsurgical scarring on the left. BI-RADS CATEGORY  2: Benign.      12/26/2016 Imaging    MM DIAG BREAST TOMO BILATERAL 12/26/2016 IMPRESSION: No findings worrisome for recurrent tumor or developing malignancy. Stable breast parenchymal pattern. BI-RADS CATEGORY  1: Negative.      01/01/2018 Mammogram    MM DIAG BREAST TOMO BILATERAL: IMPRESSION: 1. Possible subtle architectural distortion within the lower right breast, lower outer quadrant based on tomosynthesis slice position, best seen on MLO slice 61, less conspicuously seen on spot compression MLO and true lateral images, without sonographic correlate. Stereotactic biopsy with 3D tomosynthesis guidance is recommended. 2. Benign right-sided  subareolar and periareolar duct ectasia, the most likely source for patient's intermittent clear nipple discharge. 3. No evidence of malignancy within the left breast. Stable postsurgical changes within the left breast.       HISTORY OF PRESENTING ILLNESS:  Cynthia Ford 57 y.o. female is here because of recently diagnosed left breast DCIS. She presented with her female partner to the clinic today.  She had papilloma in 2010 and had lumpectomy. She has been followed up with annual mammogram. Her mammogram on 12/19/2014 showed a 2 new solid mass in the 6:00 position of the left breast. She underwent biopsy which showed ductal papilloma. She subsequently underwent left breast lumpectomy on 02/14/2015, and the surgical path showed DCIS and intraductal papilloma.  She has recovered very well from surgery. She has minimal discomfort at the surgical site, has good appetite and energy level. No other complaints.  In terms of breast cancer risk profile:  She menarched at early age of 34 and went to menopause at age 30 She had 0 pregnancy.  She never received birth control pills for approximately .  She was  never exposed to fertility medications or hormone replacement therapy.  She has  family history of Breast cancer in her niece   CURRENT THERAPY: anastrozole 1mg  daily started on 06/30/2015  INTERIM HISTORY: Cynthia Ford returns for follow-up as scheduled. She is doing well overall.   Of note since the patient last visit, She has  had a right needle core biopsy completed on 01/07/2018 with results revealing Breast, right, needle core biopsy, lower central with complex sclerosing lesion with usual ductal hyperplasia. Periductlar chronic inflammation. On 01/01/2018 she had a bilateral diagnostic mammography with ultrasonography, showing 1. Possible subtle architectural distortion within the lower right breast, lower outer quadrant based on tomosynthesis slice position, best seen on MLO slice 61, less  conspicuously seen on spot compression MLO and true lateral images, without sonographic correlate. 2. Benign right-sided subareolar and periareolar duct ectasia, the most likely source for patient's intermittent clear nipple discharge. 3. No evidence of malignancy within the left breast. Stable postsurgical changes within the left breast. She has a follow up appointment next week to schedule surgery to remove the benign tissue. She had a bone density scan completed on 01/01/2018 with results showing: T-score of -0.7 at the forearm. She is compliant with her Anastrozole.   On review of systems, pt reports hot flashes, mild anxiety, soreness after her biopsy and the continued sharp pains due to the scar tissue in her left breast. The pains are bearable and she has full ROM and has started exercising again. She denies joint pain or any other complaints at this time.   MEDICAL HISTORY:  Past Medical History:  Diagnosis Date  . Anemia   . Anxiety   . Asthma   . GERD (gastroesophageal reflux disease)   . Papilloma of left breast   . Personal history of radiation therapy   . PONV (postoperative nausea and vomiting)     SURGICAL HISTORY: Past Surgical History:  Procedure Laterality Date  . BREAST LUMPECTOMY Left 2016  . BREAST LUMPECTOMY WITH RADIOACTIVE SEED LOCALIZATION Left 02/14/2015   Procedure: BREAST LUMPECTOMY WITH RADIOACTIVE SEED LOCALIZATION;  Surgeon: Erroll Luna, MD;  Location: [facility];  Service: General;  Laterality: Left;  . BREAST SURGERY  2010   LEFT  . CHOLECYSTECTOMY  2001  . KNEE ARTHROSCOPY Left 1999/1993  . NASAL PASSAGE  2000  . NASAL SEPTUM SURGERY      SOCIAL HISTORY: Social History   Socioeconomic History  . Marital status: Single    Spouse name: Not on file  . Number of children: 0  . Years of education: Not on file  . Highest education level: Not on file  Social Needs  . Financial resource strain: Not on file  . Food insecurity - worry:  Not on file  . Food insecurity - inability: Not on file  . Transportation needs - medical: Not on file  . Transportation needs - non-medical: Not on file  Occupational History  . Occupation: Architect  Tobacco Use  . Smoking status: Never Smoker  . Smokeless tobacco: Never Used  Substance and Sexual Activity  . Alcohol use: No    Alcohol/week: 0.0 oz  . Drug use: No  . Sexual activity: Yes    Birth control/protection: Post-menopausal  Other Topics Concern  . Not on file  Social History Narrative  . Not on file    FAMILY HISTORY: Family History  Problem Relation Age of Onset  . Breast cancer Unknown 18       niece  . Prostate cancer Brother 29  . Prostate cancer Brother 31  . Prostate cancer Maternal Grandfather 87.    ALLERGIES:  has No Known Allergies.  MEDICATIONS:  Current Outpatient Medications  Medication Sig Dispense Refill  . albuterol (PROVENTIL HFA;VENTOLIN HFA) 108 (90 BASE) MCG/ACT inhaler Inhale into the lungs every 6 (six)  hours as needed for wheezing or shortness of breath.    . anastrozole (ARIMIDEX) 1 MG tablet Take 1 tablet (1 mg total) by mouth daily. 90 tablet 3  . calcium-vitamin D (OSCAL WITH D) 500-200 MG-UNIT tablet Take 1 tablet by mouth daily.    . cetirizine (ZYRTEC) 10 MG tablet Take 10 mg by mouth daily.      . cholecalciferol (VITAMIN D) 1000 units tablet Take 2,000 Units by mouth daily.    . DULERA 100-5 MCG/ACT AERO Inhale 2 puffs into the lungs 2 (two) times daily.  11  . omeprazole (PRILOSEC) 20 MG capsule Take 2 capsules (40 mg total) by mouth daily. 30 capsule 0  . promethazine-codeine (PHENERGAN WITH CODEINE) 6.25-10 MG/5ML syrup TAKE 5 MLS EVERY 6 HOURS AS NEEDED FOR SEVERE COUGH  0   No current facility-administered medications for this visit.     REVIEW OF SYSTEMS:   Constitutional: Denies fevers, chills or abnormal night sweats (+) hot flashes Eyes: Denies blurriness of vision, double vision or watery eyes Ears, nose,  mouth, throat, and face: Denies mucositis or sore throat Respiratory: Denies cough, dyspnea or wheezes Cardiovascular: Denies palpitation, chest discomfort or lower extremity swelling Gastrointestinal:  Denies nausea, heartburn or change in bowel habits Skin: Denies abnormal skin rashes Lymphatics: Denies new lymphadenopathy or easy bruising Neurological:Denies numbness, tingling or new weaknesses Behavioral/Psych: Mood is stable, no new changes  All other systems were reviewed with the patient and are negative.  PHYSICAL EXAMINATION:  ECOG PERFORMANCE STATUS: 0 - Asymptomatic  Vitals:   01/12/18 0928  BP: 119/81  Pulse: 68  Resp: 18  Temp: 98 F (36.7 C)  SpO2: 99%   Filed Weights   01/12/18 0928  Weight: 159 lb (72.1 kg)    GENERAL:alert, no distress and comfortable SKIN: skin color, texture, turgor are normal, no rashes or significant lesions EYES: normal, conjunctiva are pink and non-injected, sclera clear OROPHARYNX:no exudate, no erythema and lips, buccal mucosa, and tongue normal  NECK: supple, thyroid normal size, non-tender, without nodularity LYMPH:  no palpable lymphadenopathy in the cervical, axillary or inguinal LUNGS: clear to auscultation and percussion with normal breathing effort HEART: regular rate & rhythm and no murmurs and no lower extremity edema ABDOMEN:abdomen soft, non-tender and normal bowel sounds Musculoskeletal:no cyanosis of digits and no clubbing  PSYCH: alert & oriented x 3 with fluent speech NEURO: no focal motor/sensory deficits Breasts: Breast inspection showed them to be symmetrical with no nipple discharge. The surgical scar at the LOQ left breast inferior to the nipple healed well, but is tender to palpation. Palpation of the breasts and axilla revealed no obvious mass that I could appreciate.  LABORATORY DATA:  I have reviewed the data as listed  CBC Latest Ref Rng & Units 01/12/2018 07/08/2017 01/06/2017  WBC 3.9 - 10.3 K/uL 7.2 7.9 7.2   Hemoglobin 11.6 - 15.9 g/dL 13.1 13.3 12.8  Hematocrit 34.8 - 46.6 % 38.2 38.3 36.0  Platelets 145 - 400 K/uL 266 293 286    CMP Latest Ref Rng & Units 01/12/2018 07/08/2017 01/06/2017  Glucose 70 - 140 mg/dL 95 86 96  BUN 7 - 26 mg/dL 14 10.3 12.2  Creatinine 0.60 - 1.10 mg/dL 0.95 0.9 0.9  Sodium 136 - 145 mmol/L 141 139 142  Potassium 3.3 - 4.7 mmol/L 4.3 3.6 3.6  Chloride 98 - 109 mmol/L 106 - -  CO2 22 - 29 mmol/L 28 27 28   Calcium 8.4 - 10.4 mg/dL 9.8 10.0 10.0  Total Protein 6.4 - 8.3 g/dL 7.7 7.7 7.5  Total Bilirubin 0.2 - 1.2 mg/dL 0.4 0.38 0.28  Alkaline Phos 40 - 150 U/L 109 120 111  AST 5 - 34 U/L 17 19 16   ALT 0 - 55 U/L 15 17 15      PATHOLOGY REPORT:  Diagnosis 01/07/2018 Breast, right, needle core biopsy, lower central - COMPLEX SCLEROSING LESION WITH USUAL DUCTAL HYPERPLASIA - PERIDUCTULAR CHRONIC INFLAMMATION - SEE COMMENT Microscopic Comment These results were called to [facility] on January 08, 2018.    Diagnosis 12/19/2014 Breast, left, needle core biopsy, mass, 6 o'clock, slightly outer - DUCTAL PAPILLOMA. - SEE MICROSCOPIC DESCRIPTION.  Diagnosis 02/14/2015 Breast, lumpectomy, left - LOW GRADE DUCTAL CARCINOMA IN SITU INVOLVING INTRADUCTAL PAPILLOMA, SEE COMMENT. - SURGICAL MARGINS NEGATIVE FOR TUMOR - PREVIOUS BIOPSY SITE IDENTIFIED - SEE TUMOR SYNOPTIC TEMPLATE BELOW.  Specimen, including laterality: Left breast Procedure (include lymph node sampling sentinel-non-sentinel): Lumpectomy without lymph node sampling Grade of carcinoma: I of III Necrosis: Absent Estimated tumor size: 67mm See comment Treatment effect: None If present, treatment effect in breast tissue, lymph nodes or both: N/A Distance to closest margin: 2 mm (anterior) and 1.5 mm (posterior), see comment. If margin positive, focally or broadly: N/A Breast prognostic profile: Pending and will be reported in an addendum. Lymph nodes: Examined: 0 Sentinel 0  Non-sentinel 0 Total Lymph nodes with metastasis: N/A Isolated tumor cells (< 0.2 mm): N/A Micrometastasis ( > 0.2 mm and < 2.0 mm): N/A Macrometastasis (> 2.0 mm): N/A Extranodal extension: N/A PROGNOSTIC INDICATORS - ACIS TNM: pTis, pNX Results: IMMUNOHISTOCHEMICAL AND MORPHOMETRIC ANALYSIS BY THE AUTOMATED CELLULAR IMAGING SYSTEM (ACIS) Estrogen Receptor: 100%, POSITIVE, STRONG STAINING INTENSITY Progesterone Receptor: 100%, POSITIVE, STRONG STAINING INTENSITY  RADIOGRAPHIC STUDIES: I have personally reviewed the radiological images as listed and agreed with the findings in the report.  Bone Density: 01/01/2018 T-score of -0.7  MM DIAG BREAST TOMO BILATERAL and US BREAST LTD UNI RIGHT INC AXILLA, 01/01/2018 1. Possible subtle architectural distortion within the lower right breast, lower outer quadrant based on tomosynthesis slice position, best seen on MLO slice 61, less conspicuously seen on spot compression MLO and true lateral images, without sonographic correlate. Stereotactic biopsy with 3D tomosynthesis guidance is recommended. 2. Benign right-sided subareolar and periareolar duct ectasia, the most likely source for patient's intermittent clear nipple discharge. 3. No evidence of malignancy within the left breast. Stable postsurgical changes within the left breast.  MM DIAG BREAST TOMO BILATERAL 12/26/2016 IMPRESSION: No findings worrisome for recurrent tumor or developing malignancy. Stable breast parenchymal pattern. BI-RADS CATEGORY  1: Negative.  MAMMOGRAM 12/26/2015 IMPRESSION: No evidence of malignancy. Benign postsurgical scarring on the left.  DEXA 08/08/2015 ASSESSMENT: Patient's diagnostic category is NORMAL by WHO Criteria.  Bone density scan 08/08/2015 ASSESSMENT: Patient's diagnostic category is NORMAL by WHO Criteria.  ASSESSMENT & PLAN: 57 y.o. African-American postmenopausal female   1. DCIS of left breast, ER+/PR+ -The patient had early stage disease.  She is considered cured of disease.  -Any form of adjuvant treatment is for prevention of disease recurrence.  -she is on anastrozole for breast cancer prevention, is tolerating anastrozole well, we'll continue, plan for a total 5 years.  -Continue breast cancer surveillance with annual mammogram, and a regular follow-up with [place].  -She is clinically doing very well. Exam was normal. Lab results reviewed with her,  unremarkable CBC and CMP, no clinical concern of breast cancer. -I previously encouraged her to continue healthy diet  and regular exercise, which promotes overall health and reduce risk of breast cancer, she is compliant on that. -Diagnostic bilateral mammogram and ultrasonography on 01/01/18 with results of possible subtle architectural distortion within the lower right breast, lower outer quadrant based on tomosynthesis slice position.  No evidence of malignancy within the left breast. Stable postsurgical changes within the left breast. -Continue breast cancer surveillance -Clinically doing well, exam was unremarkable, discussed lab results that are WNL today.  -Continue anastrozole -F/u in 6 months  2. Right breast complex sclerosing lesion -Recent mammogram showed subtle architectural distortion within the right breast, biopsy showed complex sclerosing lesion, no evidence of malignancy. -Scheduled to see surgeon next week, possible lumpectomy.  3. Genetic -Her maternal niece had breast cancer at age of 53, 2 brothers has prostate cancer. -Her genetic test was negative, except unknown significance variant in [facility] 1 gene. 43. Bone health -Her bone density scan in August 2016 was normal -Repeat bone density scan in August 2018. -I previously encouraged her to continue calcium and vitamin D -We previously discussed anastrozole may weaken her bone. -T-score = -0.7, I discussed results with pt. She is taking a calcium supplement, vitamin D and a multivitamin.    5. Vitamin D  deficiency -She was found to have low vitamin D, treated with high-dose vitamin D by her primary care physician -She is currently on vitamin D 1000 units a day, will follow up with her primary care physician.  6. Left chest pain -The patient reports she has this pain when she over extends her left arm or move it a certain way. This is likely related to her previous breast surgery -I previously offered physical therapy, but the patient states she'll manage it on her own with exercise. -I continued to encourage her to exercise, as increased movement can help with scar tissue  Plan -Contnue anastrozole. -She is going to see her surgeon for right breast complex sclerosing lesion, possible lobectomy in the near future -Return to clinic in 6 months with labs or sooner if surgery biopsy results reveal cancer  All questions were answered. The patient knows to call the clinic with any problems, questions or concerns.  I spent 20 minutes counseling the patient face to face. The total time spent in the appointment was 25 minutes and more than 50% was on counseling.     Truitt Merle, MD 01/12/2018 6:30 PM   This document serves as a record of services personally performed by Truitt Merle, MD. It was created on her behalf by Theresia Bough, a trained medical scribe. The creation of this record is based on the scribe's personal observations and the provider's statements to them.   I have reviewed the above documentation for accuracy and completeness, and I agree with the above.

## 2018-01-12 ENCOUNTER — Inpatient Hospital Stay (HOSPITAL_BASED_OUTPATIENT_CLINIC_OR_DEPARTMENT_OTHER): Payer: 59 | Admitting: Hematology

## 2018-01-12 ENCOUNTER — Telehealth: Payer: Self-pay | Admitting: *Deleted

## 2018-01-12 ENCOUNTER — Telehealth: Payer: Self-pay | Admitting: Hematology

## 2018-01-12 ENCOUNTER — Encounter: Payer: Self-pay | Admitting: Hematology

## 2018-01-12 ENCOUNTER — Inpatient Hospital Stay: Payer: 59 | Attending: Hematology

## 2018-01-12 VITALS — BP 119/81 | HR 68 | Temp 98.0°F | Resp 18 | Ht 64.0 in | Wt 159.0 lb

## 2018-01-12 DIAGNOSIS — Z170 Estrogen receptor positive status [ER+]: Secondary | ICD-10-CM | POA: Insufficient documentation

## 2018-01-12 DIAGNOSIS — Z86000 Personal history of in-situ neoplasm of breast: Secondary | ICD-10-CM | POA: Insufficient documentation

## 2018-01-12 DIAGNOSIS — D0512 Intraductal carcinoma in situ of left breast: Secondary | ICD-10-CM

## 2018-01-12 DIAGNOSIS — Z923 Personal history of irradiation: Secondary | ICD-10-CM

## 2018-01-12 DIAGNOSIS — E559 Vitamin D deficiency, unspecified: Secondary | ICD-10-CM | POA: Insufficient documentation

## 2018-01-12 DIAGNOSIS — Z79811 Long term (current) use of aromatase inhibitors: Secondary | ICD-10-CM | POA: Diagnosis not present

## 2018-01-12 LAB — CBC WITH DIFFERENTIAL/PLATELET
Basophils Absolute: 0.1 10*3/uL (ref 0.0–0.1)
Basophils Relative: 1 %
Eosinophils Absolute: 0.7 10*3/uL — ABNORMAL HIGH (ref 0.0–0.5)
Eosinophils Relative: 10 %
HCT: 38.2 % (ref 34.8–46.6)
Hemoglobin: 13.1 g/dL (ref 11.6–15.9)
Lymphocytes Relative: 20 %
Lymphs Abs: 1.4 10*3/uL (ref 0.9–3.3)
MCH: 27 pg (ref 25.1–34.0)
MCHC: 34.2 g/dL (ref 31.5–36.0)
MCV: 79 fL — ABNORMAL LOW (ref 79.5–101.0)
Monocytes Absolute: 0.5 10*3/uL (ref 0.1–0.9)
Monocytes Relative: 8 %
Neutro Abs: 4.4 10*3/uL (ref 1.5–6.5)
Neutrophils Relative %: 61 %
Platelets: 266 10*3/uL (ref 145–400)
RBC: 4.84 MIL/uL (ref 3.70–5.45)
RDW: 13.6 % (ref 11.2–16.1)
WBC: 7.2 10*3/uL (ref 3.9–10.3)

## 2018-01-12 LAB — COMPREHENSIVE METABOLIC PANEL
ALT: 15 U/L (ref 0–55)
AST: 17 U/L (ref 5–34)
Albumin: 3.9 g/dL (ref 3.5–5.0)
Alkaline Phosphatase: 109 U/L (ref 40–150)
Anion gap: 7 (ref 3–11)
BUN: 14 mg/dL (ref 7–26)
CO2: 28 mmol/L (ref 22–29)
Calcium: 9.8 mg/dL (ref 8.4–10.4)
Chloride: 106 mmol/L (ref 98–109)
Creatinine, Ser: 0.95 mg/dL (ref 0.60–1.10)
GFR calc Af Amer: >60 mL/min (ref >60)
GFR calc non Af Amer: >60 mL/min (ref >60)
Glucose, Bld: 95 mg/dL (ref 70–140)
Potassium: 4.3 mmol/L (ref 3.3–4.7)
Sodium: 141 mmol/L (ref 136–145)
Total Bilirubin: 0.4 mg/dL (ref 0.2–1.2)
Total Protein: 7.7 g/dL (ref 6.4–8.3)

## 2018-01-12 NOTE — Telephone Encounter (Signed)
Scheduled appt per 1/14 los - Pt aware of appts .

## 2018-01-12 NOTE — Telephone Encounter (Signed)
Called pt for DEXA report.  Pt saw Dr Burr Medico this am & report was given to her by Dr Burr Medico.

## 2018-01-12 NOTE — Telephone Encounter (Signed)
-----   Message from Truitt Merle, MD sent at 01/11/2018  7:48 PM EST ----- Please let pt know that her DEXA was normal, thanks  Truitt Merle  01/11/2018

## 2018-01-19 ENCOUNTER — Other Ambulatory Visit: Payer: Self-pay | Admitting: Surgery

## 2018-01-19 ENCOUNTER — Ambulatory Visit: Payer: Self-pay | Admitting: Surgery

## 2018-01-19 DIAGNOSIS — N6310 Unspecified lump in the right breast, unspecified quadrant: Secondary | ICD-10-CM

## 2018-01-19 NOTE — H&P (View-Only) (Signed)
Cynthia Ford Documented: 01/19/2018 1:53 PM Location: [facility] Patient #: [id number] DOB: [birth year] Single / Language: Cynthia Ford / Race: Black or African American Female  History of Present Illness Cynthia Moores A. Franck Vinal MD; 01/19/2018 2:26 PM) Patient words: right breast csl    Patient returns for follow-up with history of left breast DCIS treated with left breast lumpectomy radiation therapy in 2016. She's been followed by her medical oncologist here. She returns after recent mammogram showed an area of distortion in the right breast in the lower central breast with nipple discharge. Core biopsy showed complex sclerosing lesion. Patient denies any history of breast mass has not noticed any drainage on a room from the right breast. Left breast is without issue today.    Diagnosis Breast, right, needle core biopsy, lower central - COMPLEX SCLEROSING LESION WITH USUAL DUCTAL HYPERPLASIA - PERIDUCTULAR CHRONIC INFLAMMATION - SEE COMMENT      CLINICAL DATA: History of left breast cancer in 2016 status post lumpectomy and radiation therapy.Clear right-sided nipple discharge noted during today's mammogram.  EXAM: 2D DIGITAL DIAGNOSTIC BILATERAL MAMMOGRAM WITH CAD AND ADJUNCT TOMO  ULTRASOUND RIGHT BREAST  COMPARISON: Previous exam(s).  ACR Breast Density Category c: The breast tissue is heterogeneously dense, which may obscure small masses.  FINDINGS: There is a small possible architectural distortion identified within the lower right breast, only seen on the MLO and true lateral views, located within the lower outer quadrant based on tomosynthesis slice position, best seen on MLO slice 61, less conspicuously seen on spot compression MLO slice 41, also less conspicuously seen on the true lateral slice 35.  There are stable postsurgical changes within the left breast. There are no new dominant masses, suspicious calcifications or secondary signs of  malignancy within the left breast.  Mammographic images were processed with CAD.  Targeted ultrasound is performed, evaluating the subareolar right breast and lower outer quadrant, showing multiple mildly distended subareolar and periareolar milk ducts containing avascular debris, compatible with benign duct ectasia, a likely source for the right-sided discharge. No suspicious solid or cystic mass is seen by ultrasound  Right axilla was evaluated with ultrasound showing no enlarged or morphologically abnormal lymph nodes.  IMPRESSION: 1. Possible subtle architectural distortion within the lower right breast, lower outer quadrant based on tomosynthesis slice position, best seen on MLO slice 61, less conspicuously seen on spot compression MLO and true lateral images, without sonographic correlate. Stereotactic biopsy with 3D tomosynthesis guidance is recommended.  2. Benign right-sided subareolar and periareolar duct ectasia, the most likely source for patient's intermittent clear nipple discharge.  3. No evidence of malignancy within the left breast. Stable postsurgical changes within the left breast.  RECOMMENDATION: 1. Stereotactic biopsy, with 3D tomosynthesis guidance, for the possible subtle architectural distortion within the lower RIGHT breast. If no persistent target is identified at the time of stereotactic biopsy, six-month follow-up diagnostic mammogram would be recommended.  2. Causes of unilateral nipple discharge include: Hormonal changes, fibrocystic changes, benign papilloma, abscess/mastitis, birth control pills, endocrine disorders, injury/trauma to breast, duct ectasia, medications, prolactinoma, and breast cancer. As is evident from this list, nipple discharge often stems from a benign condition, however, breast cancer is a possibility when unilateral spontaneous persistent single duct discharge (especially bloody or clear discharge) is present. Patient  describes intermittent clear right-sided nipple discharge. Benign duct ectasia identified on today's ultrasound is the most likely cause for this discharge. If biopsy results are benign, and nipple discharge persists, breast MRI would be recommended for  further characterization.  Recommendation for breast MRI based on data provided by the [employer] of Radiology Appropriateness Criteria, produced by an expert panel on breast imaging, showing breast MRI with contrast to have a high sensitivity and NPV for identifying the cause of pathologic nipple discharge.  Stereotactic biopsy is scheduled for January 9th.  I have discussed the findings and recommendations with the patient. Results were also provided in writing at the conclusion of the visit. If applicable, a reminder letter will be sent to the patient regarding the next appointment.  BI-RADS CATEGORY 4: Suspicious.   Electronically Signed By: Cynthia Ford M.D. On: 01/01/2018 16:18.  The patient is a 57 year old female.   Problem List/Past Medical Cynthia Ford, CMA; 01/19/2018 1:53 PM) POST-OPERATIVE STATE (Z98.890) HISTORY OF DUCTAL CARCINOMA IN SITU (DCIS) OF BREAST (Z86.000) PAPILLOMA OF BREAST, LEFT (D24.2)  Past Surgical History Cynthia Ford, CMA; 01/19/2018 1:53 PM) Breast Biopsy Left. multiple Gallbladder Surgery - Laparoscopic Knee Surgery Left.  Diagnostic Studies History Cynthia Lull R. Rolena Infante, CMA; 01/19/2018 1:53 PM) Colonoscopy 5-10 years ago Mammogram within last year Pap Smear 1-5 years ago  Allergies Cynthia Ford, CMA; 01/19/2018 1:53 PM) No Known Drug Allergies [01/09/2015]:  Medication History Cynthia Ford, CMA; 01/19/2018 1:56 PM) Anastrozole (1MG  Tablet, Oral) Active. Flovent HFA (110MCG/ACT Aerosol, Inhalation) Active. Fluticasone Propionate (50MCG/ACT Suspension, Nasal as needed) Active. ZyrTEC Allergy (10MG  Tablet, Oral) Active. Albuterol Sulfate  ((2.5 MG/3ML)0.083% Nebulized Soln, Inhalation) Active. Vitamin D (Oral) Specific strength unknown - Active. Vitamin C (Oral) Specific strength unknown - Active. Medications Reconciled  Social History Cynthia Ford, CMA; 01/19/2018 1:53 PM) Alcohol use Occasional alcohol use. Caffeine use Carbonated beverages, Coffee, Tea. No drug use Tobacco use Never smoker.  Family History Cynthia Lull R. Rolena Infante, CMA; 01/19/2018 1:53 PM) Alcohol Abuse Father, Sister. Arthritis Mother. Colon Polyps Mother. Diabetes Mellitus Sister. Ischemic Bowel Disease Mother. Prostate Cancer Brother. Respiratory Condition Mother.  Pregnancy / Birth History Cynthia Lull R. Rolena Infante, CMA; 01/19/2018 1:53 PM) Age at menarche 19 years. Age of menopause 71-55 Gravida 0 Irregular periods Para 0  Other Problems Cynthia Ford, CMA; 01/19/2018 1:53 PM) Anxiety Disorder Asthma Back Pain Cholelithiasis Gastroesophageal Reflux Disease Lump In Breast Thyroid Disease    Vitals Cynthia Ford CMA; 01/19/2018 1:53 PM) 01/19/2018 1:53 PM Weight: 158.38 lb Height: 64in Body Surface Area: 1.77 m Body Mass Index: 27.18 kg/m  BP: 122/84 (Sitting, Left Arm, Standard)      Physical Exam (Tansy Lorek A. Taleigh Gero MD; 01/19/2018 2:27 PM)  General Mental Status-Alert. General Appearance-Consistent with stated age. Hydration-Well hydrated. Voice-Normal.  Head and Neck Head-normocephalic, atraumatic with no lesions or palpable masses. Trachea-midline. Thyroid Gland Characteristics - normal size and consistency.  Breast Note: Mild clear nipple discharge on the right. No masses in the right. Left breast there is scar from previous surgery. No masses.  Neurologic Neurologic evaluation reveals -alert and oriented x 3 with no impairment of recent or remote memory. Mental Status-Normal.  Musculoskeletal Normal Exam - Left-Upper Extremity Strength Normal and  Lower Extremity Strength Normal. Normal Exam - Right-Upper Extremity Strength Normal and Lower Extremity Strength Normal.  Lymphatic Head & Neck  General Head & Neck Lymphatics: Bilateral - Description - Normal. Axillary  General Axillary Region: Bilateral - Description - Normal. Tenderness - Non Tender.    Assessment & Plan (Tanishka Drolet A. Durant Scibilia MD; 01/19/2018 2:27 PM)  BREAST MASS, RIGHT (N63.10) Impression: CSL recommend excison right breast CSL Risk of lumpectomy include bleeding, infection, seroma, more surgery,  use of seed/wire, wound care, cosmetic deformity and the need for other treatments, death , blood clots, death. Pt agrees to proceed. Discussed small risk of malignancy in the setting. Discussed observation.  Current Plans Pt Education - [facility] Breast Biopsy HCI: discussed with patient and provided information. The anatomy and the physiology was discussed. The pathophysiology and natural history of the disease was discussed. Options were discussed and recommendations were made. Risk of bleeding infection, cosmetic deformity and the need for othe treatments or procedures. Technique, risks, benefits, & alternatives were discussed. Risks such as stroke, heart attack, bleeding, indection, death, and other risks discussed. Questions answered. The patient agrees to proceed.

## 2018-01-19 NOTE — H&P (Signed)
Cynthia Ford Documented: 01/19/2018 1:53 PM Location: [facility] Patient #: [id number] DOB: [birth year] Single / Language: [name] / Race: Black or African American Female  History of Present Illness [name] A. Lateria Alderman MD; 01/19/2018 2:26 PM) Patient words: right breast csl    Patient returns for follow-up with history of left breast DCIS treated with left breast lumpectomy radiation therapy in 2016. She's been followed by her medical oncologist here. She returns after recent mammogram showed an area of distortion in the right breast in the lower central breast with nipple discharge. Core biopsy showed complex sclerosing lesion. Patient denies any history of breast mass has not noticed any drainage on a room from the right breast. Left breast is without issue today.    Diagnosis Breast, right, needle core biopsy, lower central - COMPLEX SCLEROSING LESION WITH USUAL DUCTAL HYPERPLASIA - PERIDUCTULAR CHRONIC INFLAMMATION - SEE COMMENT      CLINICAL DATA: History of left breast cancer in 2016 status post lumpectomy and radiation therapy.Clear right-sided nipple discharge noted during today's mammogram.  EXAM: 2D DIGITAL DIAGNOSTIC BILATERAL MAMMOGRAM WITH CAD AND ADJUNCT TOMO  ULTRASOUND RIGHT BREAST  COMPARISON: Previous exam(s).  ACR Breast Density Category c: The breast tissue is heterogeneously dense, which may obscure small masses.  FINDINGS: There is a small possible architectural distortion identified within the lower right breast, only seen on the MLO and true lateral views, located within the lower outer quadrant based on tomosynthesis slice position, best seen on MLO slice 61, less conspicuously seen on spot compression MLO slice 41, also less conspicuously seen on the true lateral slice 35.  There are stable postsurgical changes within the left breast. There are no new dominant masses, suspicious calcifications or secondary signs of  malignancy within the left breast.  Mammographic images were processed with CAD.  Targeted ultrasound is performed, evaluating the subareolar right breast and lower outer quadrant, showing multiple mildly distended subareolar and periareolar milk ducts containing avascular debris, compatible with benign duct ectasia, a likely source for the right-sided discharge. No suspicious solid or cystic mass is seen by ultrasound  Right axilla was evaluated with ultrasound showing no enlarged or morphologically abnormal lymph nodes.  IMPRESSION: 1. Possible subtle architectural distortion within the lower right breast, lower outer quadrant based on tomosynthesis slice position, best seen on MLO slice 61, less conspicuously seen on spot compression MLO and true lateral images, without sonographic correlate. Stereotactic biopsy with 3D tomosynthesis guidance is recommended.  2. Benign right-sided subareolar and periareolar duct ectasia, the most likely source for patient's intermittent clear nipple discharge.  3. No evidence of malignancy within the left breast. Stable postsurgical changes within the left breast.  RECOMMENDATION: 1. Stereotactic biopsy, with 3D tomosynthesis guidance, for the possible subtle architectural distortion within the lower RIGHT breast. If no persistent target is identified at the time of stereotactic biopsy, six-month follow-up diagnostic mammogram would be recommended.  2. Causes of unilateral nipple discharge include: Hormonal changes, fibrocystic changes, benign papilloma, abscess/mastitis, birth control pills, endocrine disorders, injury/trauma to breast, duct ectasia, medications, prolactinoma, and breast cancer. As is evident from this list, nipple discharge often stems from a benign condition, however, breast cancer is a possibility when unilateral spontaneous persistent single duct discharge (especially bloody or clear discharge) is present. Patient  describes intermittent clear right-sided nipple discharge. Benign duct ectasia identified on today's ultrasound is the most likely cause for this discharge. If biopsy results are benign, and nipple discharge persists, breast MRI would be recommended for  further characterization.  Recommendation for breast MRI based on data provided by the [employer] of Radiology Appropriateness Criteria, produced by an expert panel on breast imaging, showing breast MRI with contrast to have a high sensitivity and NPV for identifying the cause of pathologic nipple discharge.  Stereotactic biopsy is scheduled for January 9th.  I have discussed the findings and recommendations with the patient. Results were also provided in writing at the conclusion of the visit. If applicable, a reminder letter will be sent to the patient regarding the next appointment.  BI-RADS CATEGORY 4: Suspicious.   Electronically Signed By: Franki Cabot M.D. On: 01/01/2018 16:18.  The patient is a 57 year old female.   Problem List/Past Medical Sharyn Lull R. Brooks, CMA; 01/19/2018 1:53 PM) POST-OPERATIVE STATE (Z98.890) HISTORY OF DUCTAL CARCINOMA IN SITU (DCIS) OF BREAST (Z86.000) PAPILLOMA OF BREAST, LEFT (D24.2)  Past Surgical History Sharyn Lull R. Brooks, CMA; 01/19/2018 1:53 PM) Breast Biopsy Left. multiple Gallbladder Surgery - Laparoscopic Knee Surgery Left.  Diagnostic Studies History Sharyn Lull R. Rolena Infante, CMA; 01/19/2018 1:53 PM) Colonoscopy 5-10 years ago Mammogram within last year Pap Smear 1-5 years ago  Allergies Sharyn Lull R. Brooks, CMA; 01/19/2018 1:53 PM) No Known Drug Allergies [01/09/2015]:  Medication History Sharyn Lull R. Brooks, CMA; 01/19/2018 1:56 PM) Anastrozole (1MG  Tablet, Oral) Active. Flovent HFA (110MCG/ACT Aerosol, Inhalation) Active. Fluticasone Propionate (50MCG/ACT Suspension, Nasal as needed) Active. ZyrTEC Allergy (10MG  Tablet, Oral) Active. Albuterol Sulfate  ((2.5 MG/3ML)0.083% Nebulized Soln, Inhalation) Active. Vitamin D (Oral) Specific strength unknown - Active. Vitamin C (Oral) Specific strength unknown - Active. Medications Reconciled  Social History Sharyn Lull R. Brooks, CMA; 01/19/2018 1:53 PM) Alcohol use Occasional alcohol use. Caffeine use Carbonated beverages, Coffee, Tea. No drug use Tobacco use Never smoker.  Family History Sharyn Lull R. Rolena Infante, CMA; 01/19/2018 1:53 PM) Alcohol Abuse Father, Sister. Arthritis Mother. Colon Polyps Mother. Diabetes Mellitus Sister. Ischemic Bowel Disease Mother. Prostate Cancer Brother. Respiratory Condition Mother.  Pregnancy / Birth History Sharyn Lull R. Rolena Infante, CMA; 01/19/2018 1:53 PM) Age at menarche 30 years. Age of menopause 23-55 Gravida 0 Irregular periods Para 0  Other Problems Sharyn Lull R. Brooks, CMA; 01/19/2018 1:53 PM) Anxiety Disorder Asthma Back Pain Cholelithiasis Gastroesophageal Reflux Disease Lump In Breast Thyroid Disease    Vitals Sharyn Lull R. Brooks CMA; 01/19/2018 1:53 PM) 01/19/2018 1:53 PM Weight: 158.38 lb Height: 64in Body Surface Area: 1.77 m Body Mass Index: 27.18 kg/m  BP: 122/84 (Sitting, Left Arm, Standard)      Physical Exam (Elli Groesbeck A. Odelia Graciano MD; 01/19/2018 2:27 PM)  General Mental Status-Alert. General Appearance-Consistent with stated age. Hydration-Well hydrated. Voice-Normal.  Head and Neck Head-normocephalic, atraumatic with no lesions or palpable masses. Trachea-midline. Thyroid Gland Characteristics - normal size and consistency.  Breast Note: Mild clear nipple discharge on the right. No masses in the right. Left breast there is scar from previous surgery. No masses.  Neurologic Neurologic evaluation reveals -alert and oriented x 3 with no impairment of recent or remote memory. Mental Status-Normal.  Musculoskeletal Normal Exam - Left-Upper Extremity Strength Normal and  Lower Extremity Strength Normal. Normal Exam - Right-Upper Extremity Strength Normal and Lower Extremity Strength Normal.  Lymphatic Head & Neck  General Head & Neck Lymphatics: Bilateral - Description - Normal. Axillary  General Axillary Region: Bilateral - Description - Normal. Tenderness - Non Tender.    Assessment & Plan (Shermeka Rutt A. Karee Forge MD; 01/19/2018 2:27 PM)  BREAST MASS, RIGHT (N63.10) Impression: CSL recommend excison right breast CSL Risk of lumpectomy include bleeding, infection, seroma, more surgery,  use of seed/wire, wound care, cosmetic deformity and the need for other treatments, death , blood clots, death. Pt agrees to proceed. Discussed small risk of malignancy in the setting. Discussed observation.  Current Plans Pt Education - [facility] Breast Biopsy HCI: discussed with patient and provided information. The anatomy and the physiology was discussed. The pathophysiology and natural history of the disease was discussed. Options were discussed and recommendations were made. Risk of bleeding infection, cosmetic deformity and the need for othe treatments or procedures. Technique, risks, benefits, & alternatives were discussed. Risks such as stroke, heart attack, bleeding, indection, death, and other risks discussed. Questions answered. The patient agrees to proceed.

## 2018-02-10 ENCOUNTER — Encounter (HOSPITAL_BASED_OUTPATIENT_CLINIC_OR_DEPARTMENT_OTHER): Payer: Self-pay | Admitting: *Deleted

## 2018-02-10 ENCOUNTER — Other Ambulatory Visit: Payer: Self-pay

## 2018-02-13 ENCOUNTER — Encounter (HOSPITAL_BASED_OUTPATIENT_CLINIC_OR_DEPARTMENT_OTHER): Payer: Self-pay

## 2018-02-16 ENCOUNTER — Ambulatory Visit
Admission: RE | Admit: 2018-02-16 | Discharge: 2018-02-16 | Disposition: A | Discharge status: Home / Self Care | Payer: 59 | Source: Ambulatory Visit | Attending: Surgery | Admitting: Surgery

## 2018-02-16 DIAGNOSIS — N6310 Unspecified lump in the right breast, unspecified quadrant: Secondary | ICD-10-CM

## 2018-02-16 NOTE — Progress Notes (Signed)
Ensure pre surgery drink given with instructions to complete by 0400 dos, pt verbalized understanding. 

## 2018-02-17 ENCOUNTER — Ambulatory Visit (HOSPITAL_BASED_OUTPATIENT_CLINIC_OR_DEPARTMENT_OTHER): Payer: 59 | Admitting: Anesthesiology

## 2018-02-17 ENCOUNTER — Other Ambulatory Visit: Payer: Self-pay

## 2018-02-17 ENCOUNTER — Encounter (HOSPITAL_BASED_OUTPATIENT_CLINIC_OR_DEPARTMENT_OTHER)
Admission: RE | Disposition: A | Discharge status: Home / Self Care | Payer: Self-pay | Source: Ambulatory Visit | Attending: Surgery

## 2018-02-17 ENCOUNTER — Encounter (HOSPITAL_BASED_OUTPATIENT_CLINIC_OR_DEPARTMENT_OTHER): Payer: Self-pay | Admitting: *Deleted

## 2018-02-17 ENCOUNTER — Ambulatory Visit (HOSPITAL_BASED_OUTPATIENT_CLINIC_OR_DEPARTMENT_OTHER)
Admission: RE | Admit: 2018-02-17 | Discharge: 2018-02-17 | Disposition: A | Discharge status: Home / Self Care | Payer: 59 | Source: Ambulatory Visit | Attending: Surgery | Admitting: Surgery

## 2018-02-17 ENCOUNTER — Ambulatory Visit
Admission: RE | Admit: 2018-02-17 | Discharge: 2018-02-17 | Disposition: A | Discharge status: Home / Self Care | Payer: 59 | Source: Ambulatory Visit | Attending: Surgery | Admitting: Surgery

## 2018-02-17 DIAGNOSIS — Z833 Family history of diabetes mellitus: Secondary | ICD-10-CM | POA: Insufficient documentation

## 2018-02-17 DIAGNOSIS — Z7951 Long term (current) use of inhaled steroids: Secondary | ICD-10-CM | POA: Insufficient documentation

## 2018-02-17 DIAGNOSIS — N6489 Other specified disorders of breast: Secondary | ICD-10-CM | POA: Insufficient documentation

## 2018-02-17 DIAGNOSIS — Z836 Family history of other diseases of the respiratory system: Secondary | ICD-10-CM | POA: Diagnosis not present

## 2018-02-17 DIAGNOSIS — Z811 Family history of alcohol abuse and dependence: Secondary | ICD-10-CM | POA: Insufficient documentation

## 2018-02-17 DIAGNOSIS — N6011 Diffuse cystic mastopathy of right breast: Secondary | ICD-10-CM | POA: Insufficient documentation

## 2018-02-17 DIAGNOSIS — Z79811 Long term (current) use of aromatase inhibitors: Secondary | ICD-10-CM | POA: Insufficient documentation

## 2018-02-17 DIAGNOSIS — Z86000 Personal history of in-situ neoplasm of breast: Secondary | ICD-10-CM | POA: Insufficient documentation

## 2018-02-17 DIAGNOSIS — N6310 Unspecified lump in the right breast, unspecified quadrant: Secondary | ICD-10-CM | POA: Diagnosis present

## 2018-02-17 DIAGNOSIS — Z8379 Family history of other diseases of the digestive system: Secondary | ICD-10-CM | POA: Insufficient documentation

## 2018-02-17 DIAGNOSIS — Z79899 Other long term (current) drug therapy: Secondary | ICD-10-CM | POA: Diagnosis not present

## 2018-02-17 DIAGNOSIS — Z853 Personal history of malignant neoplasm of breast: Secondary | ICD-10-CM | POA: Insufficient documentation

## 2018-02-17 DIAGNOSIS — J45909 Unspecified asthma, uncomplicated: Secondary | ICD-10-CM | POA: Diagnosis not present

## 2018-02-17 DIAGNOSIS — Z8261 Family history of arthritis: Secondary | ICD-10-CM | POA: Insufficient documentation

## 2018-02-17 DIAGNOSIS — Z8042 Family history of malignant neoplasm of prostate: Secondary | ICD-10-CM | POA: Diagnosis not present

## 2018-02-17 DIAGNOSIS — Z9049 Acquired absence of other specified parts of digestive tract: Secondary | ICD-10-CM | POA: Diagnosis not present

## 2018-02-17 DIAGNOSIS — Z923 Personal history of irradiation: Secondary | ICD-10-CM | POA: Insufficient documentation

## 2018-02-17 DIAGNOSIS — Z9889 Other specified postprocedural states: Secondary | ICD-10-CM | POA: Diagnosis not present

## 2018-02-17 HISTORY — PX: BREAST LUMPECTOMY WITH RADIOACTIVE SEED LOCALIZATION: SHX6424

## 2018-02-17 HISTORY — PX: BREAST EXCISIONAL BIOPSY: SUR124

## 2018-02-17 SURGERY — BREAST LUMPECTOMY WITH RADIOACTIVE SEED LOCALIZATION
Anesthesia: General | Site: Breast | Laterality: Right

## 2018-02-17 MED ORDER — CELECOXIB 200 MG PO CAPS
200.0000 mg | ORAL_CAPSULE | ORAL | Status: AC
  Administered 2018-02-17: 200 mg via ORAL

## 2018-02-17 MED ORDER — BUPIVACAINE-EPINEPHRINE 0.25% -1:200000 IJ SOLN
INTRAMUSCULAR | Status: AC
  Filled 2018-02-17: qty 1

## 2018-02-17 MED ORDER — PHENYLEPHRINE 40 MCG/ML (10ML) SYRINGE FOR IV PUSH (FOR BLOOD PRESSURE SUPPORT)
PREFILLED_SYRINGE | INTRAVENOUS | Status: DC | PRN
  Administered 2018-02-17 (×2): 80 ug via INTRAVENOUS

## 2018-02-17 MED ORDER — PROPOFOL 10 MG/ML IV BOLUS
INTRAVENOUS | Status: DC | PRN
  Administered 2018-02-17: 150 mg via INTRAVENOUS
  Administered 2018-02-17: 50 mg via INTRAVENOUS

## 2018-02-17 MED ORDER — SCOPOLAMINE 1 MG/3DAYS TD PT72
1.0000 | MEDICATED_PATCH | Freq: Once | TRANSDERMAL | Status: DC | PRN
Start: 2018-02-17 — End: 2018-02-17
  Administered 2018-02-17: 1.5 mg via TRANSDERMAL

## 2018-02-17 MED ORDER — DEXAMETHASONE SODIUM PHOSPHATE 10 MG/ML IJ SOLN
INTRAMUSCULAR | Status: DC | PRN
  Administered 2018-02-17: 10 mg via INTRAVENOUS

## 2018-02-17 MED ORDER — ONDANSETRON HCL 4 MG/2ML IJ SOLN
INTRAMUSCULAR | Status: DC | PRN
  Administered 2018-02-17: 4 mg via INTRAVENOUS

## 2018-02-17 MED ORDER — LACTATED RINGERS IV SOLN
INTRAVENOUS | Status: DC
  Administered 2018-02-17: 07:00:00 via INTRAVENOUS

## 2018-02-17 MED ORDER — CEFAZOLIN SODIUM-DEXTROSE 2-4 GM/100ML-% IV SOLN
INTRAVENOUS | Status: AC
  Filled 2018-02-17: qty 100

## 2018-02-17 MED ORDER — CELECOXIB 200 MG PO CAPS
ORAL_CAPSULE | ORAL | Status: AC
  Filled 2018-02-17: qty 1

## 2018-02-17 MED ORDER — MIDAZOLAM HCL 2 MG/2ML IJ SOLN: 0.5000 mg | Freq: Once | INTRAMUSCULAR | Status: DC | PRN

## 2018-02-17 MED ORDER — BUPIVACAINE-EPINEPHRINE (PF) 0.25% -1:200000 IJ SOLN
INTRAMUSCULAR | Status: DC | PRN
  Administered 2018-02-17: 10 mL via PERINEURAL

## 2018-02-17 MED ORDER — DEXAMETHASONE SODIUM PHOSPHATE 10 MG/ML IJ SOLN
INTRAMUSCULAR | Status: AC
  Filled 2018-02-17: qty 1

## 2018-02-17 MED ORDER — HYDROCODONE-ACETAMINOPHEN 5-325 MG PO TABS: 1.0000 | ORAL_TABLET | ORAL | 0 refills | Status: DC | PRN

## 2018-02-17 MED ORDER — GABAPENTIN 300 MG PO CAPS
ORAL_CAPSULE | ORAL | Status: AC
  Filled 2018-02-17: qty 1

## 2018-02-17 MED ORDER — MIDAZOLAM HCL 5 MG/5ML IJ SOLN
INTRAMUSCULAR | Status: DC | PRN
  Administered 2018-02-17: 2 mg via INTRAVENOUS

## 2018-02-17 MED ORDER — PROPOFOL 10 MG/ML IV BOLUS
INTRAVENOUS | Status: AC
  Filled 2018-02-17: qty 20

## 2018-02-17 MED ORDER — PROMETHAZINE HCL 25 MG/ML IJ SOLN: 6.2500 mg | INTRAMUSCULAR | Status: DC | PRN

## 2018-02-17 MED ORDER — ONDANSETRON HCL 4 MG/2ML IJ SOLN
INTRAMUSCULAR | Status: AC
  Filled 2018-02-17: qty 2

## 2018-02-17 MED ORDER — IBUPROFEN 800 MG PO TABS: 800.0000 mg | ORAL_TABLET | Freq: Three times a day (TID) | ORAL | 0 refills | Status: AC | PRN

## 2018-02-17 MED ORDER — PHENYLEPHRINE 40 MCG/ML (10ML) SYRINGE FOR IV PUSH (FOR BLOOD PRESSURE SUPPORT)
PREFILLED_SYRINGE | INTRAVENOUS | Status: AC
  Filled 2018-02-17: qty 10

## 2018-02-17 MED ORDER — FENTANYL CITRATE (PF) 100 MCG/2ML IJ SOLN
INTRAMUSCULAR | Status: AC
  Filled 2018-02-17: qty 2

## 2018-02-17 MED ORDER — FENTANYL CITRATE (PF) 100 MCG/2ML IJ SOLN: 25.0000 ug | INTRAMUSCULAR | Status: DC | PRN

## 2018-02-17 MED ORDER — MEPERIDINE HCL 25 MG/ML IJ SOLN: 6.2500 mg | INTRAMUSCULAR | Status: DC | PRN

## 2018-02-17 MED ORDER — CEFAZOLIN SODIUM-DEXTROSE 2-4 GM/100ML-% IV SOLN
2.0000 g | INTRAVENOUS | Status: AC
  Administered 2018-02-17: 2 g via INTRAVENOUS

## 2018-02-17 MED ORDER — FENTANYL CITRATE (PF) 100 MCG/2ML IJ SOLN
50.0000 ug | INTRAMUSCULAR | Status: DC | PRN
  Administered 2018-02-17: 100 ug via INTRAVENOUS

## 2018-02-17 MED ORDER — MIDAZOLAM HCL 2 MG/2ML IJ SOLN: 1.0000 mg | INTRAMUSCULAR | Status: DC | PRN

## 2018-02-17 MED ORDER — GABAPENTIN 300 MG PO CAPS
300.0000 mg | ORAL_CAPSULE | ORAL | Status: AC
  Administered 2018-02-17: 300 mg via ORAL

## 2018-02-17 MED ORDER — CHLORHEXIDINE GLUCONATE CLOTH 2 % EX PADS: 6.0000 | MEDICATED_PAD | Freq: Once | CUTANEOUS | Status: DC

## 2018-02-17 MED ORDER — ACETAMINOPHEN 500 MG PO TABS
ORAL_TABLET | ORAL | Status: AC
  Filled 2018-02-17: qty 2

## 2018-02-17 MED ORDER — ACETAMINOPHEN 500 MG PO TABS
1000.0000 mg | ORAL_TABLET | ORAL | Status: AC
  Administered 2018-02-17: 1000 mg via ORAL

## 2018-02-17 MED ORDER — SCOPOLAMINE 1 MG/3DAYS TD PT72
MEDICATED_PATCH | TRANSDERMAL | Status: AC
  Filled 2018-02-17: qty 1

## 2018-02-17 MED ORDER — MIDAZOLAM HCL 2 MG/2ML IJ SOLN
INTRAMUSCULAR | Status: AC
  Filled 2018-02-17: qty 2

## 2018-02-17 MED ORDER — LIDOCAINE 2% (20 MG/ML) 5 ML SYRINGE
INTRAMUSCULAR | Status: DC | PRN
  Administered 2018-02-17: 35 mg via INTRAVENOUS

## 2018-02-17 SURGICAL SUPPLY — 46 items
APPLIER CLIP 9.375 MED OPEN (MISCELLANEOUS) ×2
BINDER BREAST LRG (GAUZE/BANDAGES/DRESSINGS) IMPLANT
BINDER BREAST MEDIUM (GAUZE/BANDAGES/DRESSINGS) ×2 IMPLANT
BINDER BREAST XLRG (GAUZE/BANDAGES/DRESSINGS) IMPLANT
BINDER BREAST XXLRG (GAUZE/BANDAGES/DRESSINGS) IMPLANT
BLADE SURG 15 STRL LF DISP TIS (BLADE) ×1 IMPLANT
BLADE SURG 15 STRL SS (BLADE) ×1
CANISTER SUC SOCK COL 7IN (MISCELLANEOUS) IMPLANT
CANISTER SUCT 1200ML W/VALVE (MISCELLANEOUS) ×2 IMPLANT
CHLORAPREP W/TINT 26ML (MISCELLANEOUS) ×2 IMPLANT
CLIP APPLIE 9.375 MED OPEN (MISCELLANEOUS) ×1 IMPLANT
COVER BACK TABLE 60X90IN (DRAPES) ×2 IMPLANT
COVER MAYO STAND STRL (DRAPES) ×2 IMPLANT
COVER PROBE W GEL 5X96 (DRAPES) ×2 IMPLANT
DECANTER SPIKE VIAL GLASS SM (MISCELLANEOUS) IMPLANT
DERMABOND ADVANCED (GAUZE/BANDAGES/DRESSINGS) ×1
DERMABOND ADVANCED .7 DNX12 (GAUZE/BANDAGES/DRESSINGS) ×1 IMPLANT
DEVICE DUBIN W/COMP PLATE 8390 (MISCELLANEOUS) ×2 IMPLANT
DRAPE LAPAROSCOPIC ABDOMINAL (DRAPES) ×2 IMPLANT
DRAPE LAPAROTOMY 100X72 PEDS (DRAPES) ×2 IMPLANT
DRAPE UTILITY XL STRL (DRAPES) ×2 IMPLANT
ELECT COATED BLADE 2.86 ST (ELECTRODE) ×2 IMPLANT
ELECT REM PT RETURN 9FT ADLT (ELECTROSURGICAL) ×2
ELECTRODE REM PT RTRN 9FT ADLT (ELECTROSURGICAL) ×1 IMPLANT
GLOVE BIOGEL PI IND STRL 8 (GLOVE) ×1 IMPLANT
GLOVE BIOGEL PI INDICATOR 8 (GLOVE) ×1
GLOVE ECLIPSE 8.0 STRL XLNG CF (GLOVE) ×2 IMPLANT
GOWN STRL REUS W/ TWL LRG LVL3 (GOWN DISPOSABLE) ×2 IMPLANT
GOWN STRL REUS W/TWL LRG LVL3 (GOWN DISPOSABLE) ×2
HEMOSTAT ARISTA ABSORB 3G PWDR (MISCELLANEOUS) IMPLANT
HEMOSTAT SNOW SURGICEL 2X4 (HEMOSTASIS) IMPLANT
KIT MARKER MARGIN INK (KITS) ×2 IMPLANT
NEEDLE HYPO 25X1 1.5 SAFETY (NEEDLE) ×2 IMPLANT
NS IRRIG 1000ML POUR BTL (IV SOLUTION) ×2 IMPLANT
PACK BASIN DAY SURGERY FS (CUSTOM PROCEDURE TRAY) ×2 IMPLANT
PENCIL BUTTON HOLSTER BLD 10FT (ELECTRODE) ×2 IMPLANT
SLEEVE SCD COMPRESS KNEE MED (MISCELLANEOUS) ×2 IMPLANT
SPONGE LAP 4X18 X RAY DECT (DISPOSABLE) ×2 IMPLANT
SUT MNCRL AB 4-0 PS2 18 (SUTURE) ×2 IMPLANT
SUT SILK 2 0 SH (SUTURE) IMPLANT
SUT VICRYL 3-0 CR8 SH (SUTURE) ×2 IMPLANT
SYR CONTROL 10ML LL (SYRINGE) ×2 IMPLANT
TOWEL OR 17X24 6PK STRL BLUE (TOWEL DISPOSABLE) ×2 IMPLANT
TOWEL OR NON WOVEN STRL DISP B (DISPOSABLE) ×2 IMPLANT
TUBE CONNECTING 20X1/4 (TUBING) IMPLANT
YANKAUER SUCT BULB TIP NO VENT (SUCTIONS) IMPLANT

## 2018-02-17 NOTE — Transfer of Care (Signed)
Immediate Anesthesia Transfer of Care Note  Patient: Cynthia Ford  Procedure(s) Performed: BREAST LUMPECTOMY WITH RADIOACTIVE SEED LOCALIZATION (Right Breast)  Patient Location: PACU  Anesthesia Type:General  Level of Consciousness: sedated  Airway & Oxygen Therapy: Patient Spontanous Breathing and Patient connected to face mask oxygen  Post-op Assessment: Report given to RN and Post -op Vital signs reviewed and stable  Post vital signs: Reviewed and stable  Last Vitals:  Vitals:   02/17/18 0655  BP: 104/78  Resp: 18  Temp: (!) 36.4 C  SpO2: 100%    Last Pain:  Vitals:   02/17/18 0655  TempSrc: Oral  PainSc: 0-No pain      Patients Stated Pain Goal: 3 (30/09/23 3007)  Complications: No apparent anesthesia complications

## 2018-02-17 NOTE — Op Note (Signed)
eoperative diagnosis: right breast mass  Postoperative diagnosis: Same   Procedure: right  breast seed localized lumpectomy  Surgeon: Erroll Luna M.D.  Anesthesia: Gen. With 0.25% Sensorcaine local  EBL: 10 cc  Specimen: right  breast tissue with clip and radioactive seed in the specimen. Verified with neoprobe and radiographic image showing both seed and clip in specimen  Indications for procedure: The patient presents for right breast  lumpectomy after core biopsy showed complex sclerosing lesion.  . Discussed the rationale for considering lumpectomy.  . Small risk of malignancy associated with lesion after core biopsy. Discussed observation. Discussed wire localization seed use. . Patien  desired lumpectomy  of  CSL. .The procedure has been discussed with the patient. Alternatives to surgery have been discussed with the patient.  Risks of surgery include bleeding,  Infection,  Seroma formation, death,  COSMESIS  and the need for further surgery.   The patient understands and wishes to proceed.   Description of procedure: Patient underwent seed placement as an outpatient. Patient presents today for right breast seed localized lumpectomy. Patient seen in the  holding area. Questions are answered and neoprobe used to verify seed location. Patient taken back to the operating room and placed upon the OR table. After induction of general anesthesia, right breast prepped and draped in a sterile fashion. Timeout was done to verify proper procedure. Neoprobe used and hot spot identified and in the right central  breast. This was marked with pen. Curvilinear incision made along inferior NAC. Dissection used with the help of a neoprobe around the tissue where the seed and clip were located. Tissue removed in its entirety with gross  NEGATIVE margins.. Neoprobe used and seed within specimen. Radiographs taken which show clip and seed in the specimen.Hemostasis  achieved and cavity closed with 3-0 Vicryl  and 4-0 Monocryl. Dermabond applied. All final counts found to be correct. Specimen with sed  transported to pathology. Patient awoke extubated taken to recovery in satisfactory condition.

## 2018-02-17 NOTE — Interval H&P Note (Signed)
History and Physical Interval Note:  02/17/2018 7:11 AM  Cynthia Ford  has presented today for surgery, with the diagnosis of RIGHT BREAST MASS  The various methods of treatment have been discussed with the patient and family. After consideration of risks, benefits and other options for treatment, the patient has consented to  Procedure(s): BREAST LUMPECTOMY WITH RADIOACTIVE SEED LOCALIZATION (Right) as a surgical intervention .  The patient's history has been reviewed, patient examined, no change in status, stable for surgery.  I have reviewed the patient's chart and labs.  Questions were answered to the patient's satisfaction.     Roberts

## 2018-02-17 NOTE — Anesthesia Postprocedure Evaluation (Signed)
Anesthesia Post Note  Patient: DELORES EDELSTEIN  Procedure(s) Performed: BREAST LUMPECTOMY WITH RADIOACTIVE SEED LOCALIZATION (Right Breast)     Patient location during evaluation: PACU Anesthesia Type: General Level of consciousness: awake and alert, oriented and patient cooperative Pain management: pain level controlled Vital Signs Assessment: post-procedure vital signs reviewed and stable Respiratory status: spontaneous breathing, nonlabored ventilation and respiratory function stable Cardiovascular status: blood pressure returned to baseline and stable Postop Assessment: no apparent nausea or vomiting Anesthetic complications: no    Last Vitals:  Vitals:   02/17/18 0900 02/17/18 0912  BP: 119/77 119/78  Pulse: 61 77  Resp: 12 16  Temp:  (!) 36.4 C  SpO2: 100% 99%    Last Pain:  Vitals:   02/17/18 0912  TempSrc: Oral  PainSc: 3                  Anjana Cheek,E. Kang Ishida

## 2018-02-17 NOTE — Anesthesia Procedure Notes (Signed)
Procedure Name: LMA Insertion Date/Time: 02/17/2018 7:40 AM Performed by: Lind Covert, CRNA Pre-anesthesia Checklist: Patient identified, Emergency Drugs available, Suction available and Patient being monitored Patient Re-evaluated:Patient Re-evaluated prior to induction Oxygen Delivery Method: Circle system utilized Preoxygenation: Pre-oxygenation with 100% oxygen Induction Type: IV induction Ventilation: Mask ventilation without difficulty LMA: LMA inserted LMA Size: 4.0 Tube type: Oral Number of attempts: 1 Placement Confirmation: positive ETCO2 and breath sounds checked- equal and bilateral Tube secured with: Tape Dental Injury: Teeth and Oropharynx as per pre-operative assessment

## 2018-02-17 NOTE — Anesthesia Preprocedure Evaluation (Signed)
Anesthesia Evaluation  Patient identified by MRN, date of birth, ID band Patient awake    Reviewed: Allergy & Precautions, NPO status , Patient's Chart, lab work & pertinent test results  History of Anesthesia Complications (+) PONV  Airway Mallampati: II  TM Distance: >3 FB Neck ROM: Full    Dental  (+) Missing, Caps, Dental Advisory Given   Pulmonary asthma ,    breath sounds clear to auscultation       Cardiovascular negative cardio ROS   Rhythm:Regular Rate:Normal     Neuro/Psych Anxiety negative neurological ROS     GI/Hepatic negative GI ROS, Neg liver ROS,   Endo/Other  negative endocrine ROS  Renal/GU negative Renal ROS     Musculoskeletal   Abdominal   Peds  Hematology negative hematology ROS (+)   Anesthesia Other Findings Breast cancer: XRT  Reproductive/Obstetrics                             Anesthesia Physical Anesthesia Plan  ASA: II  Anesthesia Plan: General   Post-op Pain Management:    Induction: Intravenous  PONV Risk Score and Plan: 4 or greater and Ondansetron, Dexamethasone and Scopolamine patch - Pre-op  Airway Management Planned: LMA  Additional Equipment:   Intra-op Plan:   Post-operative Plan:   Informed Consent: I have reviewed the patients History and Physical, chart, labs and discussed the procedure including the risks, benefits and alternatives for the proposed anesthesia with the patient or authorized representative who has indicated his/her understanding and acceptance.   Dental advisory given  Plan Discussed with: CRNA and Surgeon  Anesthesia Plan Comments: (Plan routine monitors, GA- LMA OK)        Anesthesia Quick Evaluation

## 2018-02-17 NOTE — Discharge Instructions (Signed)
[place] Office Phone Number [phone]  BREAST BIOPSY/ PARTIAL MASTECTOMY: POST OP INSTRUCTIONS  Always review your discharge instruction sheet given to you by the facility where your surgery was performed.  IF YOU HAVE DISABILITY OR FAMILY LEAVE FORMS, YOU MUST BRING THEM TO THE OFFICE FOR PROCESSING.  DO NOT GIVE THEM TO YOUR DOCTOR.  1. A prescription for pain medication may be given to you upon discharge.  Take your pain medication as prescribed, if needed.  If narcotic pain medicine is not needed, then you may take acetaminophen (Tylenol) or ibuprofen (Advil) as needed. Next Dose of Tylenol may be taken at 1:00pm if needed. 2. Take your usually prescribed medications unless otherwise directed 3. If you need a refill on your pain medication, please contact your pharmacy.  They will contact our office to request authorization.  Prescriptions will not be filled after 5pm or on week-ends. 4. You should eat very light the first 24 hours after surgery, such as soup, crackers, pudding, etc.  Resume your normal diet the day after surgery. 5. Most patients will experience some swelling and bruising in the breast.  Ice packs and a good support bra will help.  Swelling and bruising can take several days to resolve.  6. It is common to experience some constipation if taking pain medication after surgery.  Increasing fluid intake and taking a stool softener will usually help or prevent this problem from occurring.  A mild laxative (Milk of Magnesia or Miralax) should be taken according to package directions if there are no bowel movements after 48 hours. 7. Unless discharge instructions indicate otherwise, you may remove your bandages 24-48 hours after surgery, and you may shower at that time.  You may have steri-strips (small skin tapes) in place directly over the incision.  These strips should be left on the skin for 7-10 days.  If your surgeon used skin glue on the incision, you may  shower in 24 hours.  The glue will flake off over the next 2-3 weeks.  Any sutures or staples will be removed at the office during your follow-up visit. 8. ACTIVITIES:  You may resume regular daily activities (gradually increasing) beginning the next day.  Wearing a good support bra or sports bra minimizes pain and swelling.  You may have sexual intercourse when it is comfortable. a. You may drive when you no longer are taking prescription pain medication, you can comfortably wear a seatbelt, and you can safely maneuver your car and apply brakes. b. RETURN TO WORK:  ______________________________________________________________________________________ 9. You should see your doctor in the office for a follow-up appointment approximately two weeks after your surgery.  Your doctors nurse will typically make your follow-up appointment when she calls you with your pathology report.  Expect your pathology report 2-3 business days after your surgery.  You may call to check if you do not hear from [place] after three days. 10. OTHER INSTRUCTIONS: _______________________________________________________________________________________________ _____________________________________________________________________________________________________________________________________ _____________________________________________________________________________________________________________________________________ _____________________________________________________________________________________________________________________________________  WHEN TO CALL YOUR DOCTOR: 1. Fever over 101.0 2. Nausea and/or vomiting. 3. Extreme swelling or bruising. 4. Continued bleeding from incision. 5. Increased pain, redness, or drainage from the incision.  The clinic staff is available to answer your questions during regular business hours.  Please dont hesitate to call and ask to speak to one of the nurses for clinical concerns.   If you have a medical emergency, go to the nearest emergency room or call 911.  A surgeon from [facility] is always on call  at the hospital.  For further questions, please visit [email or web address]    Post Anesthesia Home Care Instructions  Activity: Get plenty of rest for the remainder of the day. A responsible individual must stay with you for 24 hours following the procedure.  For the next 24 hours, DO NOT: -Drive a car -Paediatric nurse -Drink alcoholic beverages -Take any medication unless instructed by your physician -Make any legal decisions or sign important papers.  Meals: Start with liquid foods such as gelatin or soup. Progress to regular foods as tolerated. Avoid greasy, spicy, heavy foods. If nausea and/or vomiting occur, drink only clear liquids until the nausea and/or vomiting subsides. Call your physician if vomiting continues.  Special Instructions/Symptoms: Your throat may feel dry or sore from the anesthesia or the breathing tube placed in your throat during surgery. If this causes discomfort, gargle with warm salt water. The discomfort should disappear within 24 hours.  If you had a scopolamine patch placed behind your ear for the management of post- operative nausea and/or vomiting:  1. The medication in the patch is effective for 72 hours, after which it should be removed.  Wrap patch in a tissue and discard in the trash. Wash hands thoroughly with soap and water. 2. You may remove the patch earlier than 72 hours if you experience unpleasant side effects which may include dry mouth, dizziness or visual disturbances. 3. Avoid touching the patch. Wash your hands with soap and water after contact with the patch.

## 2018-02-18 ENCOUNTER — Encounter (HOSPITAL_BASED_OUTPATIENT_CLINIC_OR_DEPARTMENT_OTHER): Payer: Self-pay | Admitting: Surgery

## 2018-03-03 ENCOUNTER — Other Ambulatory Visit: Payer: Self-pay | Admitting: *Deleted

## 2018-03-03 DIAGNOSIS — Z170 Estrogen receptor positive status [ER+]: Principal | ICD-10-CM

## 2018-03-03 DIAGNOSIS — C50512 Malignant neoplasm of lower-outer quadrant of left female breast: Secondary | ICD-10-CM

## 2018-03-03 MED ORDER — ANASTROZOLE 1 MG PO TABS: 1.0000 mg | ORAL_TABLET | Freq: Every day | ORAL | 3 refills | Status: DC

## 2018-07-03 ENCOUNTER — Other Ambulatory Visit: Payer: Self-pay | Admitting: Family Medicine

## 2018-07-03 DIAGNOSIS — E041 Nontoxic single thyroid nodule: Secondary | ICD-10-CM

## 2018-07-13 ENCOUNTER — Inpatient Hospital Stay: Payer: BLUE CROSS/BLUE SHIELD

## 2018-07-13 ENCOUNTER — Encounter: Payer: Self-pay | Admitting: Hematology

## 2018-07-13 ENCOUNTER — Inpatient Hospital Stay: Payer: BLUE CROSS/BLUE SHIELD | Attending: Hematology | Admitting: Hematology

## 2018-07-13 ENCOUNTER — Telehealth: Payer: Self-pay | Admitting: Hematology

## 2018-07-13 VITALS — BP 131/85 | HR 70 | Temp 98.6°F | Resp 17 | Ht 64.0 in | Wt 160.3 lb

## 2018-07-13 DIAGNOSIS — E559 Vitamin D deficiency, unspecified: Secondary | ICD-10-CM | POA: Diagnosis not present

## 2018-07-13 DIAGNOSIS — H9190 Unspecified hearing loss, unspecified ear: Secondary | ICD-10-CM | POA: Diagnosis not present

## 2018-07-13 DIAGNOSIS — D0512 Intraductal carcinoma in situ of left breast: Secondary | ICD-10-CM

## 2018-07-13 DIAGNOSIS — Z170 Estrogen receptor positive status [ER+]: Secondary | ICD-10-CM | POA: Diagnosis not present

## 2018-07-13 DIAGNOSIS — R718 Other abnormality of red blood cells: Secondary | ICD-10-CM | POA: Diagnosis not present

## 2018-07-13 DIAGNOSIS — F419 Anxiety disorder, unspecified: Secondary | ICD-10-CM | POA: Insufficient documentation

## 2018-07-13 DIAGNOSIS — Z79811 Long term (current) use of aromatase inhibitors: Secondary | ICD-10-CM | POA: Diagnosis not present

## 2018-07-13 LAB — CBC WITH DIFFERENTIAL/PLATELET
Basophils Absolute: 0 10*3/uL (ref 0.0–0.1)
Basophils Relative: 1 %
Eosinophils Absolute: 0.7 10*3/uL — ABNORMAL HIGH (ref 0.0–0.5)
Eosinophils Relative: 8 %
HCT: 37.2 % (ref 34.8–46.6)
Hemoglobin: 13 g/dL (ref 11.6–15.9)
Lymphocytes Relative: 18 %
Lymphs Abs: 1.6 10*3/uL (ref 0.9–3.3)
MCH: 27.2 pg (ref 25.1–34.0)
MCHC: 34.9 g/dL (ref 31.5–36.0)
MCV: 77.8 fL — ABNORMAL LOW (ref 79.5–101.0)
Monocytes Absolute: 0.7 10*3/uL (ref 0.1–0.9)
Monocytes Relative: 8 %
Neutro Abs: 5.8 10*3/uL (ref 1.5–6.5)
Neutrophils Relative %: 65 %
Platelets: 233 10*3/uL (ref 145–400)
RBC: 4.78 MIL/uL (ref 3.70–5.45)
RDW: 13.8 % (ref 11.2–14.5)
WBC: 8.8 10*3/uL (ref 3.9–10.3)

## 2018-07-13 LAB — COMPREHENSIVE METABOLIC PANEL
ALT: 30 U/L (ref 0–44)
AST: 22 U/L (ref 15–41)
Albumin: 4.1 g/dL (ref 3.5–5.0)
Alkaline Phosphatase: 128 U/L — ABNORMAL HIGH (ref 38–126)
Anion gap: 8 (ref 5–15)
BUN: 13 mg/dL (ref 6–20)
CO2: 28 mmol/L (ref 22–32)
Calcium: 10.1 mg/dL (ref 8.9–10.3)
Chloride: 105 mmol/L (ref 98–111)
Creatinine, Ser: 0.92 mg/dL (ref 0.44–1.00)
GFR calc Af Amer: >60 mL/min (ref >60)
GFR calc non Af Amer: >60 mL/min (ref >60)
Glucose, Bld: 100 mg/dL — ABNORMAL HIGH (ref 70–99)
Potassium: 4.1 mmol/L (ref 3.5–5.1)
Sodium: 141 mmol/L (ref 135–145)
Total Bilirubin: 0.4 mg/dL (ref 0.3–1.2)
Total Protein: 7.8 g/dL (ref 6.5–8.1)

## 2018-07-13 NOTE — Telephone Encounter (Signed)
Gave patient avs and calendar of upcoming July 2020 appts.  °

## 2018-07-13 NOTE — Progress Notes (Signed)
[place]  [phone] Fax:([phone]) [phone]  Clinic Follow Up Note   Patient Care Team: [name], MD as PCP - General (Family Medicine)   Date of Service:  07/13/2018  CHIEF COMPLAINTS:  Follow up left breast DCIS  Oncology History   Breast cancer of lower-outer quadrant of left female breast   Staging form: Breast, AJCC 7th Edition     Clinical: Stage 0 (Tis (DCIS), N0, M0) - Unsigned       Ductal carcinoma in situ (DCIS) of left breast   12/19/2014 Pathology Results    Ductal papilloma      12/19/2014 Mammogram    2 new solid masses (72mm, 68mm) in the 6:00 position of the left breast.      02/14/2015 Initial Diagnosis    Breast cancer of lower-outer quadrant of left female breast      02/14/2015 Surgery    Left breast lumpectomy, negative margins.       02/14/2015 Pathologic Stage    Low-grade ductal carcinoma in situ involving intraductal papilloma, tumor 2 mm, ER 100%, PR 100% positive.      04/10/2015 - 05/26/2015 Radiation Therapy    adjuvant breast radiation       06/30/2015 -  Anti-estrogen oral therapy    anastrzaole 1mg  dialy       12/26/2015 Imaging    MM DIAG BREAST TOMO BILATERAL 12/26/2015 IMPRESSION: No evidence of malignancy. Benign postsurgical scarring on the left. BI-RADS CATEGORY  2: Benign.      12/26/2016 Imaging    MM DIAG BREAST TOMO BILATERAL 12/26/2016 IMPRESSION: No findings worrisome for recurrent tumor or developing malignancy. Stable breast parenchymal pattern. BI-RADS CATEGORY  1: Negative.      01/01/2018 Mammogram    MM DIAG BREAST TOMO BILATERAL: IMPRESSION: 1. Possible subtle architectural distortion within the lower right breast, lower outer quadrant based on tomosynthesis slice position, best seen on MLO slice 61, less conspicuously seen on spot compression MLO and true lateral images, without sonographic correlate. Stereotactic biopsy with 3D tomosynthesis guidance is recommended. 2.  Benign right-sided subareolar and periareolar duct ectasia, the most likely source for patient's intermittent clear nipple discharge. 3. No evidence of malignancy within the left breast. Stable postsurgical changes within the left breast.      01/07/2018 Pathology Results    Diagnosis 01/07/2018 Breast, right, needle core biopsy, lower central - COMPLEX SCLEROSING LESION WITH USUAL DUCTAL HYPERPLASIA - PERIDUCTULAR CHRONIC INFLAMMATION - SEE COMMENT Microscopic Comment These results were called to [facility] on January 08, 2018.      02/17/2018 Surgery    BREAST LUMPECTOMY WITH RADIOACTIVE SEED LOCALIZATION by Dr. Brantley Stage 02/17/18       02/17/2018 Pathology Results    Diagnosis 02/17/18  Breast, lumpectomy, Right - RADIAL SCAR. - FIBROCYSTIC CHANGES. - HEALING BIOPSY SITE. - THERE IS NO EVIDENCE OF MALIGNANCY. - SEE COMMENT. Microscopic Comment The surgical resection margin(s) of the specimen were inked and microscopically evaluated.           HISTORY OF PRESENTING ILLNESS:  Cynthia Ford 57 y.o. female is here because of recently diagnosed left breast DCIS. She presented with her female partner to the clinic today.  She had papilloma in 2010 and had lumpectomy. She has been followed up with annual mammogram. Her mammogram on 12/19/2014 showed a 2 new solid mass in the 6:00 position of the left breast. She underwent biopsy which showed ductal papilloma. She subsequently underwent left breast lumpectomy  on 02/14/2015, and the surgical path showed DCIS and intraductal papilloma.  She has recovered very well from surgery. She has minimal discomfort at the surgical site, has good appetite and energy level. No other complaints.  In terms of breast cancer risk profile:  She menarched at early age of 89 and went to menopause at age 80 She had 0 pregnancy.  She never received birth control pills for approximately .  She was  never exposed to fertility medications  or hormone replacement therapy.  She has  family history of Breast cancer in her niece   CURRENT THERAPY:  anastrozole 1mg  daily started on 06/30/2015  INTERIM HISTORY:  Cynthia Ford is here for a follow up of her left breast cancer and post right breast lesion lumpectomy. She was last seen by me 6 months ago and in interim underwent right breast lumpectomy to remove sclerotic lesion.  She presents to the clinic today by herself. She notes she has some hearing loss, R>L, due to fluid behind her eardrum. She plans to have tubes put in to reduce fluid and will likely have hearing aid in the future.   She notes when she has hot flashes she will occasionally have nausea, she still able to have adequate appetitive and eating.     MEDICAL HISTORY:  Past Medical History:  Diagnosis Date  . Anemia   . Anxiety   . Asthma   . GERD (gastroesophageal reflux disease)   . Papilloma of left breast   . Personal history of radiation therapy   . PONV (postoperative nausea and vomiting)     SURGICAL HISTORY: Past Surgical History:  Procedure Laterality Date  . BREAST LUMPECTOMY Left 2016  . BREAST LUMPECTOMY WITH RADIOACTIVE SEED LOCALIZATION Left 02/14/2015   Procedure: BREAST LUMPECTOMY WITH RADIOACTIVE SEED LOCALIZATION;  Surgeon: Erroll Luna, MD;  Location: [facility];  Service: General;  Laterality: Left;  . BREAST LUMPECTOMY WITH RADIOACTIVE SEED LOCALIZATION Right 02/17/2018   Procedure: BREAST LUMPECTOMY WITH RADIOACTIVE SEED LOCALIZATION;  Surgeon: Erroll Luna, MD;  Location: [place] [facility];  Service: General;  Laterality: Right;  . BREAST SURGERY  2010   LEFT  . CHOLECYSTECTOMY  2001  . KNEE ARTHROSCOPY Left 1999/1993  . NASAL PASSAGE  2000  . NASAL SEPTUM SURGERY      SOCIAL HISTORY: Social History   Socioeconomic History  . Marital status: Single    Spouse name: Not on file  . Number of children: 0  . Years of education: Not on file  . Highest  education level: Not on file  Occupational History  . Occupation: Architect  Social Needs  . Financial resource strain: Not on file  . Food insecurity:    Worry: Not on file    Inability: Not on file  . Transportation needs:    Medical: Not on file    Non-medical: Not on file  Tobacco Use  . Smoking status: Never Smoker  . Smokeless tobacco: Never Used  Substance and Sexual Activity  . Alcohol use: No    Alcohol/week: 0.0 oz  . Drug use: No  . Sexual activity: Yes    Birth control/protection: Post-menopausal  Lifestyle  . Physical activity:    Days per week: Not on file    Minutes per session: Not on file  . Stress: Not on file  Relationships  . Social connections:    Talks on phone: Not on file    Gets together: Not on file  Attends religious service: Not on file    Active member of club or organization: Not on file    Attends meetings of clubs or organizations: Not on file    Relationship status: Not on file  . Intimate partner violence:    Fear of current or ex partner: Not on file    Emotionally abused: Not on file    Physically abused: Not on file    Forced sexual activity: Not on file  Other Topics Concern  . Not on file  Social History Narrative  . Not on file    FAMILY HISTORY: Family History  Problem Relation Age of Onset  . Breast cancer Unknown 74       niece  . Prostate cancer Brother 26  . Prostate cancer Brother 23  . Prostate cancer Maternal Grandfather 50.    ALLERGIES:  has No Known Allergies.  MEDICATIONS:  Current Outpatient Medications  Medication Sig Dispense Refill  . albuterol (PROVENTIL HFA;VENTOLIN HFA) 108 (90 BASE) MCG/ACT inhaler Inhale into the lungs every 6 (six) hours as needed for wheezing or shortness of breath.    . anastrozole (ARIMIDEX) 1 MG tablet Take 1 tablet (1 mg total) by mouth daily. 90 tablet 3  . calcium-vitamin D (OSCAL WITH D) 500-200 MG-UNIT tablet Take 1 tablet by mouth daily.    . cetirizine  (ZYRTEC) 10 MG tablet Take 10 mg by mouth daily.      . cholecalciferol (VITAMIN D) 1000 units tablet Take 2,000 Units by mouth daily.    . DULERA 100-5 MCG/ACT AERO Inhale 2 puffs into the lungs 2 (two) times daily.  11  . HYDROcodone-acetaminophen (NORCO/VICODIN) 5-325 MG tablet Take 1 tablet by mouth every 4 (four) hours as needed for moderate pain. 10 tablet 0  . ibuprofen (ADVIL,MOTRIN) 800 MG tablet Take 1 tablet (800 mg total) by mouth every 8 (eight) hours as needed. 30 tablet 0  . Multiple Vitamin (MULTIVITAMIN WITH MINERALS) TABS tablet Take 1 tablet by mouth daily.     No current facility-administered medications for this visit.     REVIEW OF SYSTEMS:   Constitutional: Denies fevers, chills or abnormal night sweats (+) hot flashes, manageable.  Eyes: Denies blurriness of vision, double vision or watery eyes Ears, nose, mouth, throat, and face: Denies mucositis or sore throat (+) Hearing loss, R>L due to fluid build up Respiratory: Denies cough, dyspnea or wheezes Cardiovascular: Denies palpitation, chest discomfort or lower extremity swelling (+) Occasional nausea Gastrointestinal:  Denies nausea, heartburn or change in bowel habits Skin: Denies abnormal skin rashes Lymphatics: Denies new lymphadenopathy or easy bruising Neurological:Denies numbness, tingling or new weaknesses Behavioral/Psych: Mood is stable, no new changes  All other systems were reviewed with the patient and are negative.  PHYSICAL EXAMINATION:  ECOG PERFORMANCE STATUS: 0 - Asymptomatic  Vitals:   07/13/18 0942  BP: 131/85  Pulse: 70  Resp: 17  Temp: 98.6 F (37 C)  SpO2: 100%   Filed Weights   07/13/18 0942  Weight: 160 lb 4.8 oz (72.7 kg)    GENERAL:alert, no distress and comfortable SKIN: skin color, texture, turgor are normal, no rashes or significant lesions EYES: normal, conjunctiva are pink and non-injected, sclera clear OROPHARYNX:no exudate, no erythema and lips, buccal mucosa, and  tongue normal  NECK: supple, thyroid normal size, non-tender, without nodularity LYMPH:  no palpable lymphadenopathy in the cervical, axillary or inguinal LUNGS: clear to auscultation and percussion with normal breathing effort HEART: regular rate & rhythm and no  murmurs and no lower extremity edema ABDOMEN:abdomen soft, non-tender and normal bowel sounds Musculoskeletal:no cyanosis of digits and no clubbing  PSYCH: alert & oriented x 3 with fluent speech NEURO: no focal motor/sensory deficits Breasts: Breast inspection showed them to be symmetrical with no nipple discharge. S/p Right and left breast lumpectomy: Both surgical incisions healed well. Palpation of the breasts and axilla revealed no obvious mass that I could appreciate.  LABORATORY DATA:  I have reviewed the data as listed  CBC Latest Ref Rng & Units 07/13/2018 01/12/2018 07/08/2017  WBC 3.9 - 10.3 K/uL 8.8 7.2 7.9  Hemoglobin 11.6 - 15.9 g/dL 13.0 13.1 13.3  Hematocrit 34.8 - 46.6 % 37.2 38.2 38.3  Platelets 145 - 400 K/uL 233 266 293    CMP Latest Ref Rng & Units 07/13/2018 01/12/2018 07/08/2017  Glucose 70 - 99 mg/dL 100(H) 95 86  BUN 6 - 20 mg/dL 13 14 10.3  Creatinine 0.44 - 1.00 mg/dL 0.92 0.95 0.9  Sodium 135 - 145 mmol/L 141 141 139  Potassium 3.5 - 5.1 mmol/L 4.1 4.3 3.6  Chloride 98 - 111 mmol/L 105 106 -  CO2 22 - 32 mmol/L 28 28 27   Calcium 8.9 - 10.3 mg/dL 10.1 9.8 10.0  Total Protein 6.5 - 8.1 g/dL 7.8 7.7 7.7  Total Bilirubin 0.3 - 1.2 mg/dL 0.4 0.4 0.38  Alkaline Phos 38 - 126 U/L 128(H) 109 120  AST 15 - 41 U/L 22 17 19   ALT 0 - 44 U/L 30 15 17      PATHOLOGY REPORT:  Diagnosis 02/17/18  Breast, lumpectomy, Right - RADIAL SCAR. - FIBROCYSTIC CHANGES. - HEALING BIOPSY SITE. - THERE IS NO EVIDENCE OF MALIGNANCY. - SEE COMMENT. Microscopic Comment The surgical resection margin(s) of the specimen were inked and microscopically evaluated.    Diagnosis 01/07/2018 Breast, right, needle core biopsy,  lower central - COMPLEX SCLEROSING LESION WITH USUAL DUCTAL HYPERPLASIA - PERIDUCTULAR CHRONIC INFLAMMATION - SEE COMMENT Microscopic Comment These results were called to [facility] on January 08, 2018.    Diagnosis 12/19/2014 Breast, left, needle core biopsy, mass, 6 o'clock, slightly outer - DUCTAL PAPILLOMA. - SEE MICROSCOPIC DESCRIPTION.  Diagnosis 02/14/2015 Breast, lumpectomy, left - LOW GRADE DUCTAL CARCINOMA IN SITU INVOLVING INTRADUCTAL PAPILLOMA, SEE COMMENT. - SURGICAL MARGINS NEGATIVE FOR TUMOR - PREVIOUS BIOPSY SITE IDENTIFIED - SEE TUMOR SYNOPTIC TEMPLATE BELOW.  Specimen, including laterality: Left breast Procedure (include lymph node sampling sentinel-non-sentinel): Lumpectomy without lymph node sampling Grade of carcinoma: I of III Necrosis: Absent Estimated tumor size: 22mm See comment Treatment effect: None If present, treatment effect in breast tissue, lymph nodes or both: N/A Distance to closest margin: 2 mm (anterior) and 1.5 mm (posterior), see comment. If margin positive, focally or broadly: N/A Breast prognostic profile: Pending and will be reported in an addendum. Lymph nodes: Examined: 0 Sentinel 0 Non-sentinel 0 Total Lymph nodes with metastasis: N/A Isolated tumor cells (< 0.2 mm): N/A Micrometastasis ( > 0.2 mm and < 2.0 mm): N/A Macrometastasis (> 2.0 mm): N/A Extranodal extension: N/A PROGNOSTIC INDICATORS - ACIS TNM: pTis, pNX Results: IMMUNOHISTOCHEMICAL AND MORPHOMETRIC ANALYSIS BY THE AUTOMATED CELLULAR IMAGING SYSTEM (ACIS) Estrogen Receptor: 100%, POSITIVE, STRONG STAINING INTENSITY Progesterone Receptor: 100%, POSITIVE, STRONG STAINING INTENSITY  RADIOGRAPHIC STUDIES: I have personally reviewed the radiological images as listed and agreed with the findings in the report.  Bone Density: 01/01/2018 T-score of -0.7  MM DIAG BREAST TOMO BILATERAL and US BREAST LTD UNI RIGHT INC AXILLA, 01/01/2018  1. Possible  subtle architectural distortion within the lower right breast, lower outer quadrant based on tomosynthesis slice position, best seen on MLO slice 61, less conspicuously seen on spot compression MLO and true lateral images, without sonographic correlate. Stereotactic biopsy with 3D tomosynthesis guidance is recommended. 2. Benign right-sided subareolar and periareolar duct ectasia, the most likely source for patient's intermittent clear nipple discharge. 3. No evidence of malignancy within the left breast. Stable postsurgical changes within the left breast.  MM DIAG BREAST TOMO BILATERAL 12/26/2016 IMPRESSION: No findings worrisome for recurrent tumor or developing malignancy. Stable breast parenchymal pattern. BI-RADS CATEGORY  1: Negative.  MAMMOGRAM 12/26/2015 IMPRESSION: No evidence of malignancy. Benign postsurgical scarring on the left.  DEXA 08/08/2015 ASSESSMENT: Patient's diagnostic category is NORMAL by WHO Criteria.  Bone density scan 08/08/2015 ASSESSMENT: Patient's diagnostic category is NORMAL by WHO Criteria.  ASSESSMENT & PLAN: 57 y.o. African-American postmenopausal female   1. DCIS of left breast, ER+/PR+ -The patient had early stage disease. She is considered cured of disease.  -Any form of adjuvant treatment is for prevention of disease recurrence.  -she is on anastrozole for breast cancer prevention since 06/2015, is tolerating anastrozole well, we'll continue, plan for a total 5 years.  -I previously encouraged her to continue healthy diet and regular exercise, which promotes overall health and reduce risk of breast cancer, she is compliant on that. -Diagnostic bilateral mammogram and ultrasonography on 01/01/18 with results of possible subtle architectural distortion within the lower right breast, lower outer quadrant based on tomosynthesis slice position.  No evidence of malignancy within the left breast. Stable postsurgical changes within the left breast.  Biopsy showed  complex sclerosing lesion. -She underwent right breast lumpectomy on 02/17/18 which showed radial scar and no evidence of malignancy was found.  -She is clinically doing well. Lab reviewed, her CBC WNL except her MCV remains slightly low at 77.8, CMP is still pending. Her physical exam was unremarkable. There is no clinical concern for recurrence. -I will check her iron level at next visit.  -Next mammogram due in 12/2018 -Continue Anastrozole  -F/u in 1 year   2. Right breast complex sclerosing lesion, resected  -01/01/18 mammogram showed subtle architectural distortion within the right breast, biopsy showed complex sclerosing lesion, no evidence of malignancy. -01/07/18 biopsy showed complex sclerosing lesion with ductal hyperplasia -She had a right breast lumpectomy by Dr. Brantley Stage on 02/17/18 which showed radial scar, there was no evidence of malignancy.   3. Genetic -Her maternal niece had breast cancer at age of 38, 2 brothers has prostate cancer. -Her genetic test was negative, except unknown significance variant in [facility] 1 gene.   4. Bone health -Her bone density scan in August 2016 was normal -01/01/18 DEXA shows normal with lowest T-Score at forearm radius of -0.7.  -Continue calcium and vitamin D daily.   -We previously discussed anastrozole may weaken her bone.    5. Vitamin D deficiency -She was found to have low vitamin D, treated with high-dose vitamin D by her primary care physician -She is currently on vitamin D 1000 units a day. Her recent Vit D level was low and her PCP advised her to increase VitD to 2 tabs daily.  6. Left chest pain  -The patient previously reported she has this pain when she over extends her left arm or move it a certain way. This is likely related to her previous breast surgery -I previously offered physical therapy, but the patient states she'll manage it on  her own with exercise. -I continued to encourage her to exercise, as increased movement can help  with scar tissue -Not mentioned in today's visit, likely much improved or resolved now.   7. Hearing Loss, R>L -this is due to fluid collection in her ear.  -She plans to have tubes placed for draining soon and may ned hearing aids in the future.   8. RBC microcytosis  -She has persistent low MCV in the mid 70s, no anemia -I will check her iron level on next visit   Plan -Continue anastrozole. -Lab and f/u in one year  -Mammogram in 12/2018  All questions were answered. The patient knows to call the clinic with any problems, questions or concerns.  I spent 20 minutes counseling the patient face to face. The total time spent in the appointment was 25 minutes and more than 50% was on counseling.     Truitt Merle, MD 07/13/2018 2:38 PM   I, Joslyn Devon, am acting as scribe for Truitt Merle, MD.   I have reviewed the above documentation for accuracy and completeness, and I agree with the above.

## 2018-07-14 ENCOUNTER — Ambulatory Visit
Admission: RE | Admit: 2018-07-14 | Discharge: 2018-07-14 | Disposition: A | Discharge status: Home / Self Care | Payer: BLUE CROSS/BLUE SHIELD | Source: Ambulatory Visit | Attending: Family Medicine | Admitting: Family Medicine

## 2018-07-14 DIAGNOSIS — E041 Nontoxic single thyroid nodule: Secondary | ICD-10-CM

## 2018-10-20 ENCOUNTER — Other Ambulatory Visit: Payer: Self-pay | Admitting: Hematology

## 2018-10-20 DIAGNOSIS — Z1211 Encounter for screening for malignant neoplasm of colon: Secondary | ICD-10-CM

## 2018-10-28 ENCOUNTER — Encounter: Payer: Self-pay | Admitting: Gastroenterology

## 2018-11-23 ENCOUNTER — Ambulatory Visit (AMBULATORY_SURGERY_CENTER): Payer: Self-pay | Admitting: *Deleted

## 2018-11-23 ENCOUNTER — Other Ambulatory Visit: Payer: Self-pay

## 2018-11-23 ENCOUNTER — Encounter: Payer: Self-pay | Admitting: Gastroenterology

## 2018-11-23 VITALS — Ht 64.0 in | Wt 156.2 lb

## 2018-11-23 DIAGNOSIS — Z1211 Encounter for screening for malignant neoplasm of colon: Secondary | ICD-10-CM

## 2018-11-23 MED ORDER — PEG 3350-KCL-NA BICARB-NACL 420 G PO SOLR: 4000.0000 mL | Freq: Once | ORAL | 0 refills | Status: AC

## 2018-11-23 NOTE — Progress Notes (Signed)
No egg or soy allergy known to patient  No issues with past sedation with any surgeries  or procedures, no intubation problems  No diet pills per patient No home 02 use per patient  No blood thinners per patient  Pt denies issues with constipation  No A fib or A flutter  EMMI video sent to pt's e mail  

## 2018-12-08 ENCOUNTER — Encounter: Payer: Self-pay | Admitting: Gastroenterology

## 2018-12-08 ENCOUNTER — Ambulatory Visit (AMBULATORY_SURGERY_CENTER): Payer: BLUE CROSS/BLUE SHIELD | Admitting: Gastroenterology

## 2018-12-08 VITALS — BP 138/78 | HR 74 | Temp 97.7°F | Resp 23 | Ht 64.0 in | Wt 156.0 lb

## 2018-12-08 DIAGNOSIS — D122 Benign neoplasm of ascending colon: Secondary | ICD-10-CM | POA: Diagnosis not present

## 2018-12-08 DIAGNOSIS — K635 Polyp of colon: Secondary | ICD-10-CM

## 2018-12-08 DIAGNOSIS — D125 Benign neoplasm of sigmoid colon: Secondary | ICD-10-CM

## 2018-12-08 DIAGNOSIS — Z1211 Encounter for screening for malignant neoplasm of colon: Secondary | ICD-10-CM | POA: Diagnosis not present

## 2018-12-08 MED ORDER — SODIUM CHLORIDE 0.9 % IV SOLN: 500.0000 mL | Freq: Once | INTRAVENOUS | Status: DC

## 2018-12-08 NOTE — Progress Notes (Signed)
Pt's states no medical or surgical changes since previsit or office visit. 

## 2018-12-08 NOTE — Op Note (Signed)
[place] Patient Name: Cynthia Ford Procedure Date: 12/08/2018 8:01 AM MRN: [id number] Endoscopist: Milus Banister , MD Age: 57 Referring MD:  Date of Birth: March 17, 1961 Gender: Female Account #: [id number] Procedure:                Colonoscopy Indications:              Screening for colorectal malignant neoplasm Medicines:                Monitored Anesthesia Care Procedure:                Pre-Anesthesia Assessment:                           - Prior to the procedure, a History and Physical                            was performed, and patient medications and                            allergies were reviewed. The patient's tolerance of                            previous anesthesia was also reviewed. The risks                            and benefits of the procedure and the sedation                            options and risks were discussed with the patient.                            All questions were answered, and informed consent                            was obtained. Prior Anticoagulants: The patient has                            taken no previous anticoagulant or antiplatelet                            agents. ASA Grade Assessment: II - A patient with                            mild systemic disease. After reviewing the risks                            and benefits, the patient was deemed in                            satisfactory condition to undergo the procedure.                           After obtaining informed consent, the colonoscope  was passed under direct vision. Throughout the                            procedure, the patient's blood pressure, pulse, and                            oxygen saturations were monitored continuously. The                            Colonoscope was introduced through the anus and                            advanced to the the cecum, identified by                            appendiceal orifice and  ileocecal valve. The                            colonoscopy was performed without difficulty. The                            patient tolerated the procedure well. The quality                            of the bowel preparation was good. The ileocecal                            valve, appendiceal orifice, and rectum were                            photographed. Scope In: 8:04:42 AM Scope Out: 8:19:39 AM Scope Withdrawal Time: 0 hours 8 minutes 50 seconds  Total Procedure Duration: 0 hours 14 minutes 57 seconds  Findings:                 Three sessile polyps were found in the sigmoid                            colon and ascending colon. The polyps were 2 to 4                            mm in size. These polyps were removed with a cold                            snare. Resection and retrieval were complete.                           External hemorrhoids were found. The hemorrhoids                            were medium-sized.                           The exam was otherwise without abnormality on  direct and retroflexion views. Complications:            No immediate complications. Estimated blood loss:                            None. Estimated Blood Loss:     Estimated blood loss: none. Impression:               - Three 2 to 4 mm polyps in the sigmoid colon and                            in the ascending colon, removed with a cold snare.                            Resected and retrieved.                           - External hemorrhoids.                           - The examination was otherwise normal on direct                            and retroflexion views. Recommendation:           - Patient has a contact number available for                            emergencies. The signs and symptoms of potential                            delayed complications were discussed with the                            patient. Return to normal activities tomorrow.                             Written discharge instructions were provided to the                            patient.                           - Resume previous diet.                           - Continue present medications.                           You will receive a letter within 2-3 weeks with the                            pathology results and my final recommendations.                           If the polyp(s) is proven to be 'pre-cancerous' on  pathology, you will need repeat colonoscopy in 3-5                            years. If the polyp(s) is NOT 'precancerous' on                            pathology then you should repeat colon cancer                            screening in 10 years with colonoscopy without need                            for colon cancer screening by any method prior to                            then (including stool testing). Milus Banister, MD 12/08/2018 8:21:27 AM This report has been signed electronically.

## 2018-12-08 NOTE — Patient Instructions (Signed)
Handouts given for polyps and hemorrhoids  YOU HAD AN ENDOSCOPIC PROCEDURE TODAY AT [facility]:   Refer to the procedure report that was given to you for any specific questions about what was found during the examination.  If the procedure report does not answer your questions, please call your gastroenterologist to clarify.  If you requested that your care partner not be given the details of your procedure findings, then the procedure report has been included in a sealed envelope for you to review at your convenience later.  YOU SHOULD EXPECT: Some feelings of bloating in the abdomen. Passage of more gas than usual.  Walking can help get rid of the air that was put into your GI tract during the procedure and reduce the bloating. If you had a lower endoscopy (such as a colonoscopy or flexible sigmoidoscopy) you may notice spotting of blood in your stool or on the toilet paper. If you underwent a bowel prep for your procedure, you may not have a normal bowel movement for a few days.  Please Note:  You might notice some irritation and congestion in your nose or some drainage.  This is from the oxygen used during your procedure.  There is no need for concern and it should clear up in a day or so.  SYMPTOMS TO REPORT IMMEDIATELY:   Following lower endoscopy (colonoscopy or flexible sigmoidoscopy):  Excessive amounts of blood in the stool  Significant tenderness or worsening of abdominal pains  Swelling of the abdomen that is new, acute  Fever of 100F or higher  For urgent or emergent issues, a gastroenterologist can be reached at any hour by calling [phone].   DIET:  We do recommend a small meal at first, but then you may proceed to your regular diet.  Drink plenty of fluids but you should avoid alcoholic beverages for 24 hours.  ACTIVITY:  You should plan to take it easy for the rest of today and you should NOT DRIVE or use heavy machinery until tomorrow (because of the  sedation medicines used during the test).    FOLLOW UP: Our staff will call the number listed on your records the next business day following your procedure to check on you and address any questions or concerns that you may have regarding the information given to you following your procedure. If we do not reach you, we will leave a message.  However, if you are feeling well and you are not experiencing any problems, there is no need to return our call.  We will assume that you have returned to your regular daily activities without incident.  If any biopsies were taken you will be contacted by phone or by letter within the next 1-3 weeks.  Please call us at [phone] if you have not heard about the biopsies in 3 weeks.    SIGNATURES/CONFIDENTIALITY: You and/or your care partner have signed paperwork which will be entered into your electronic medical record.  These signatures attest to the fact that that the information above on your After Visit Summary has been reviewed and is understood.  Full responsibility of the confidentiality of this discharge information lies with you and/or your care-partner.

## 2018-12-08 NOTE — Progress Notes (Signed)
Called to room to assist during endoscopic procedure.  Patient ID and intended procedure confirmed with present staff. Received instructions for my participation in the procedure from the performing physician.  

## 2018-12-08 NOTE — Progress Notes (Signed)
Pt caregiver has inhaler. Pt instructed to use.  BS are wheezy

## 2018-12-08 NOTE — Progress Notes (Signed)
approx 0808 pt coughing uncontrollably...mouth suctioned but till coughing.  Sedation had been stopped.  Pt allowed to wake up and clear secretions manually   Pt reported no pain so allowed to remain less sedated rest of case because still coughing somewhat.  Dr Lenna Sciara aware

## 2018-12-08 NOTE — Progress Notes (Signed)
Pt was coughing during procedure and still upon arrival to PACU, used albuterol inhaler as soon as family member here. States her breathing is better.

## 2018-12-09 ENCOUNTER — Telehealth: Payer: Self-pay

## 2018-12-09 NOTE — Telephone Encounter (Signed)
  Follow up Call-  Call back number 12/08/2018  Post procedure Call Back phone  # [phone]  Permission to leave phone message Yes  Some recent data might be hidden     Patient questions:  Do you have a fever, pain , or abdominal swelling? No. Pain Score  0 *  Have you tolerated food without any problems? Yes.    Have you been able to return to your normal activities? Yes.    Do you have any questions about your discharge instructions: Diet   No. Medications  No. Follow up visit  No.  Do you have questions or concerns about your Care? No.  Actions: * If pain score is 4 or above: No action needed, pain <4.

## 2018-12-13 ENCOUNTER — Encounter: Payer: Self-pay | Admitting: Gastroenterology

## 2018-12-30 HISTORY — PX: TYMPANOSTOMY TUBE PLACEMENT: SHX32

## 2019-01-14 ENCOUNTER — Ambulatory Visit
Admission: RE | Admit: 2019-01-14 | Discharge: 2019-01-14 | Disposition: A | Discharge status: Home / Self Care | Payer: BLUE CROSS/BLUE SHIELD | Source: Ambulatory Visit | Attending: Hematology | Admitting: Hematology

## 2019-01-14 DIAGNOSIS — D0512 Intraductal carcinoma in situ of left breast: Secondary | ICD-10-CM

## 2019-01-14 HISTORY — DX: Malignant neoplasm of unspecified site of unspecified female breast: C50.919

## 2019-03-18 ENCOUNTER — Other Ambulatory Visit: Payer: Self-pay | Admitting: Hematology

## 2019-03-18 DIAGNOSIS — C50512 Malignant neoplasm of lower-outer quadrant of left female breast: Secondary | ICD-10-CM

## 2019-03-18 DIAGNOSIS — Z170 Estrogen receptor positive status [ER+]: Principal | ICD-10-CM

## 2019-07-08 NOTE — Progress Notes (Signed)
[facility]   [phone] Fax:([phone]) [phone]   Clinic Follow up Note   Patient Care Team: [name], MD as PCP - General (Family Medicine)  Date of Service:  07/12/2019  CHIEF COMPLAINT: Follow up left breast DCIS  SUMMARY OF ONCOLOGIC HISTORY: Oncology History Overview Note  Breast cancer of lower-outer quadrant of left female breast   Staging form: Breast, AJCC 7th Edition     Clinical: Stage 0 (Tis (DCIS), N0, M0) - Unsigned     Ductal carcinoma in situ (DCIS) of left breast  12/19/2014 Pathology Results   Ductal papilloma   12/19/2014 Mammogram   2 new solid masses (35mm, 9mm) in the 6:00 position of the left breast.   02/14/2015 Initial Diagnosis   Breast cancer of lower-outer quadrant of left female breast   02/14/2015 Surgery   Left breast lumpectomy, negative margins.    02/14/2015 Pathologic Stage   Low-grade ductal carcinoma in situ involving intraductal papilloma, tumor 2 mm, ER 100%, PR 100% positive.   04/10/2015 - 05/26/2015 Radiation Therapy   adjuvant breast radiation    06/30/2015 -  Anti-estrogen oral therapy   anastrzaole 1mg  dialy    12/26/2015 Imaging   MM DIAG BREAST TOMO BILATERAL 12/26/2015 IMPRESSION: No evidence of malignancy. Benign postsurgical scarring on the left. BI-RADS CATEGORY  2: Benign.   12/26/2016 Imaging   MM DIAG BREAST TOMO BILATERAL 12/26/2016 IMPRESSION: No findings worrisome for recurrent tumor or developing malignancy. Stable breast parenchymal pattern. BI-RADS CATEGORY  1: Negative.   01/01/2018 Mammogram   MM DIAG BREAST TOMO BILATERAL: IMPRESSION: 1. Possible subtle architectural distortion within the lower right breast, lower outer quadrant based on tomosynthesis slice position, best seen on MLO slice 61, less conspicuously seen on spot compression MLO and true lateral images, without sonographic correlate. Stereotactic biopsy with 3D tomosynthesis guidance is recommended. 2. Benign  right-sided subareolar and periareolar duct ectasia, the most likely source for patient's intermittent clear nipple discharge. 3. No evidence of malignancy within the left breast. Stable postsurgical changes within the left breast.   01/07/2018 Pathology Results   Diagnosis 01/07/2018 Breast, right, needle core biopsy, lower central - COMPLEX SCLEROSING LESION WITH USUAL DUCTAL HYPERPLASIA - PERIDUCTULAR CHRONIC INFLAMMATION - SEE COMMENT Microscopic Comment These results were called to [facility] on January 08, 2018.   02/17/2018 Surgery   BREAST LUMPECTOMY WITH RADIOACTIVE SEED LOCALIZATION by Dr. Brantley Stage 02/17/18    02/17/2018 Pathology Results   Diagnosis 02/17/18  Breast, lumpectomy, Right - RADIAL SCAR. - FIBROCYSTIC CHANGES. - HEALING BIOPSY SITE. - THERE IS NO EVIDENCE OF MALIGNANCY. - SEE COMMENT. Microscopic Comment The surgical resection margin(s) of the specimen were inked and microscopically evaluated.          CURRENT THERAPY:  anastrozole 1mg  daily started on 06/30/2015. Due to right hip pain, will hold anastrozole starting in 06/2019.   INTERVAL HISTORY:  [name] is here for a follow up left breast DCIS. She was last seen by me 1 year ago. She presents to the clinic alone. She notes she still has joint pain, especially in her right hip. She notes she tried to exercise. This is manageable once she loosens her joints, otherwise normal. This has been going on for 6 months. She also notes having a herniated disc. This has been manageable without surgery. She notes feeling tingling ing her fingers 1-2 times in the last few months.  She notes still having hot flashes night and day. She  notes her last period was 7 years ago. She notes she plans to do virtual visit with PCP soon, but may cancel until seen in person. She also notes hard of hearing with she is seeing her ENT for.     REVIEW OF SYSTEMS:   Constitutional: Denies fevers, chills or  abnormal weight loss (+) hot flashes, manageable  Eyes: Denies blurriness of vision Ears, nose, mouth, throat, and face: Denies mucositis or sore throat (+) hard of hearing  Respiratory: Denies cough, dyspnea or wheezes Cardiovascular: Denies palpitation, chest discomfort or lower extremity swelling Gastrointestinal:  Denies nausea, heartburn or change in bowel habits Skin: Denies abnormal skin rashes MSK: (+) Right hip pain (+) Herniated disc, chronic intermittent back pain Lymphatics: Denies new lymphadenopathy or easy bruising Neurological:Denies numbness, tingling or new weaknesses Behavioral/Psych: Mood is stable, no new changes  All other systems were reviewed with the patient and are negative.  MEDICAL HISTORY:  Past Medical History:  Diagnosis Date   Allergy    Anemia    Anxiety    Asthma    Breast cancer ([place]) 2016   Left Breast Cancer   GERD (gastroesophageal reflux disease)    Papilloma of left breast    Personal history of radiation therapy    PONV (postoperative nausea and vomiting)     SURGICAL HISTORY: Past Surgical History:  Procedure Laterality Date   BREAST EXCISIONAL BIOPSY Right 02/17/2018   BREAST EXCISIONAL BIOPSY Left    BREAST LUMPECTOMY Left 2016   BREAST LUMPECTOMY WITH RADIOACTIVE SEED LOCALIZATION Left 02/14/2015   Procedure: BREAST LUMPECTOMY WITH RADIOACTIVE SEED LOCALIZATION;  Surgeon: Erroll Luna, MD;  Location: [facility];  Service: General;  Laterality: Left;   BREAST LUMPECTOMY WITH RADIOACTIVE SEED LOCALIZATION Right 02/17/2018   Procedure: BREAST LUMPECTOMY WITH RADIOACTIVE SEED LOCALIZATION;  Surgeon: Erroll Luna, MD;  Location: [facility];  Service: General;  Laterality: Right;   BREAST SURGERY  2010   LEFT   CHOLECYSTECTOMY  2001   KNEE ARTHROSCOPY Left 1999/1993   NASAL PASSAGE  2000   NASAL SEPTUM SURGERY      I have reviewed the social history and family history with the  patient and they are unchanged from previous note.  ALLERGIES:  has No Known Allergies.  MEDICATIONS:  Current Outpatient Medications  Medication Sig Dispense Refill   albuterol (PROVENTIL HFA;VENTOLIN HFA) 108 (90 BASE) MCG/ACT inhaler Inhale into the lungs every 6 (six) hours as needed for wheezing or shortness of breath.     anastrozole (ARIMIDEX) 1 MG tablet TAKE 1 TABLET BY MOUTH EVERY DAY 90 tablet 3   Ascorbic Acid (VITA-C PO) Take by mouth.     budesonide-formoterol (SYMBICORT) 160-4.5 MCG/ACT inhaler      CALCIUM PO Take 600 mg by mouth.     calcium-vitamin D (OSCAL WITH D) 500-200 MG-UNIT tablet Take 1 tablet by mouth daily.     cetirizine (ZYRTEC) 10 MG tablet Take 10 mg by mouth daily.       cholecalciferol (VITAMIN D) 1000 units tablet Take 2,000 Units by mouth daily.     DULERA 100-5 MCG/ACT AERO Inhale 2 puffs into the lungs 2 (two) times daily.  11   Fluticasone-Salmeterol (ADVAIR DISKUS) 250-50 MCG/DOSE AEPB TAKE 1 PUFF BY MOUTH TWICE A DAY     ibuprofen (ADVIL,MOTRIN) 800 MG tablet Take 1 tablet (800 mg total) by mouth every 8 (eight) hours as needed. 30 tablet 0   No current facility-administered medications for this visit.  PHYSICAL EXAMINATION: ECOG PERFORMANCE STATUS: 1 - Symptomatic but completely ambulatory  Vitals:   07/12/19 0902  BP: 129/79  Pulse: 66  Resp: 18  Temp: 98.9 F (37.2 C)  SpO2: 100%   Filed Weights   07/12/19 0902  Weight: 155 lb 12.8 oz (70.7 kg)    GENERAL:alert, no distress and comfortable SKIN: skin color, texture, turgor are normal, no rashes or significant lesions EYES: normal, Conjunctiva are pink and non-injected, sclera clear  NECK: supple, thyroid normal size, non-tender, without nodularity LYMPH:  no palpable lymphadenopathy in the cervical, axillary  LUNGS: clear to auscultation and percussion with normal breathing effort HEART: regular rate & rhythm and no murmurs and no lower extremity  edema ABDOMEN:abdomen soft, non-tender and normal bowel sounds Musculoskeletal:no cyanosis of digits and no clubbing  NEURO: alert & oriented x 3 with fluent speech, no focal motor/sensory deficits BREAST: S/p left lumpectomy: Surgical incision healed well. No palpable mass or adenopathy of either breasts    LABORATORY DATA:  I have reviewed the data as listed CBC Latest Ref Rng & Units 07/12/2019 07/13/2018 01/12/2018  WBC 4.0 - 10.5 K/uL 8.9 8.8 7.2  Hemoglobin 12.0 - 15.0 g/dL 13.2 13.0 13.1  Hematocrit 36.0 - 46.0 % 38.8 37.2 38.2  Platelets 150 - 400 K/uL 293 233 266     CMP Latest Ref Rng & Units 07/12/2019 07/13/2018 01/12/2018  Glucose 70 - 99 mg/dL 101(H) 100(H) 95  BUN 6 - 20 mg/dL 13 13 14   Creatinine 0.44 - 1.00 mg/dL 0.93 0.92 0.95  Sodium 135 - 145 mmol/L 141 141 141  Potassium 3.5 - 5.1 mmol/L 3.7 4.1 4.3  Chloride 98 - 111 mmol/L 106 105 106  CO2 22 - 32 mmol/L 26 28 28   Calcium 8.9 - 10.3 mg/dL 9.7 10.1 9.8  Total Protein 6.5 - 8.1 g/dL 8.0 7.8 7.7  Total Bilirubin 0.3 - 1.2 mg/dL 0.4 0.4 0.4  Alkaline Phos 38 - 126 U/L 130(H) 128(H) 109  AST 15 - 41 U/L 21 22 17   ALT 0 - 44 U/L 18 30 15       RADIOGRAPHIC STUDIES: I have personally reviewed the radiological images as listed and agreed with the findings in the report. No results found.   ASSESSMENT & PLAN:  Cynthia Ford is a 58 y.o. female with   1. DCIS of left breast, ER+/PR+ -She was diagnosed in 01/2015. She is s/p left breast lumpectomy and adjuvant radiation.  -she is on anastrozole for breast cancer prevention since 06/2015, is tolerating anastrozole well with manageable hot flashes we'll continue, plan for a total 5 years.  -She is clinically doing with manageable right hip joint pain, chronic back pain from herniated disc. Labs reviewed, CBC and CMP WNL except MCV 77.9, Alkaline phos 130. Iron panel still pending. Physical exam and 12/2018 mammogram were unremarkable. There is no clinical concern for  recurrence. -Next mammogram due in 12/2019 -Given her right hip pain, and she has been on anastrozole for 4 years (plan for 5 years), I recommend her to stop Anastrozole to see if her pain resolves. If no improvement in 2-3 months after holding anastrozole  she can restart anastrozole and do 1 more year to complete 5 years. She is agreeable.  -F/u in 1 year  2. Right breast complex sclerosing lesion, resected  -01/01/18 mammogram showed subtle architectural distortion within the right breast, biopsy showed complex sclerosing lesion, no evidence of malignancy. -01/07/18 biopsy showed complex sclerosing lesion with ductal hyperplasia -  She had a right breast lumpectomy by Dr. Brantley Stage on 02/17/18 which showed radial scar, there was no evidence of malignancy.   3. Genetic, Testing negative except unknown significance variant in [facility] 1 gene.   4. Bone health -Her bone density scan in August 2016 was normal -01/01/18 DEXA shows normal with lowest T-Score at forearm radius of -0.7.  -Continue calcium and vitamin D daily.   -We previously discussed anastrozole may weaken her bone. Next DEXA in 12/2019.  5. Right hip stiffness and pain -She has had this for the last 6 months  -She also has herniated disc which causes intermittent chronic back pain. Surgery was not recommended.  -She has been able to manage this with exercise and walking.  -Physical exam normal today (07/12/19).  -I discussed anastrozole can cause joint pain. Will hold Anastrozole for now, if pain unrelated, she can restart.  -It is possibly arthritis. I recommend she f/u with PCP or orthopedic surgeon. She may need treatment or PT. Will hold on Xray for now until she sees her ortho. She is agreeable.     6. Vitamin D deficiency -She was found to have low vitamin D, treated with high-dose vitamin D by her primary care physician -She is currently on vitamin D 1000 units 1-2 tabs daily.    7. Hearing Loss, R>L -this is due to fluid  collection in her ear.  -She plans to have tubes placed for draining soon and may ned hearing aids in the future.  -Continue to f/u with ENT   8. RBC microcytosis  -She has persistent low MCV in the mid 70s, no anemia -I will check her iron level on next visit  -MCV 77.9 today (07/12/19). Iron panel still pending.    Plan -Hold anastrozole for now due to her right hip stiffness, OK to restart if hip pain no improvement in 2-3 months  -Lab and f/u in one year  -Mammogram in 12/2019    No problem-specific Assessment & Plan notes found for this encounter.   Orders Placed This Encounter  Procedures   MM DIAG BREAST TOMO BILATERAL    Standing Status:   Future    Standing Expiration Date:   07/11/2020    Order Specific Question:   Reason for Exam (SYMPTOM  OR DIAGNOSIS REQUIRED)    Answer:   screening    Order Specific Question:   Is the patient pregnant?    Answer:   No    Order Specific Question:   Preferred imaging location?    Answer:   [facility]   DG Bone Density    Standing Status:   Future    Standing Expiration Date:   07/11/2020    Order Specific Question:   Reason for Exam (SYMPTOM  OR DIAGNOSIS REQUIRED)    Answer:   screening    Order Specific Question:   Is the patient pregnant?    Answer:   No    Order Specific Question:   Preferred imaging location?    Answer:   [facility]   All questions were answered. The patient knows to call the clinic with any problems, questions or concerns. No barriers to learning was detected. I spent 20 minutes counseling the patient face to face. The total time spent in the appointment was 25 minutes and more than 50% was on counseling and review of test results     Truitt Merle, MD 07/12/2019   I, Joslyn Devon, am acting as scribe for Truitt Merle,  MD.   I have reviewed the above documentation for accuracy and completeness, and I agree with the above.

## 2019-07-12 ENCOUNTER — Inpatient Hospital Stay: Payer: BC Managed Care – PPO | Attending: Oncology

## 2019-07-12 ENCOUNTER — Telehealth: Payer: Self-pay | Admitting: Hematology

## 2019-07-12 ENCOUNTER — Encounter: Payer: Self-pay | Admitting: Hematology

## 2019-07-12 ENCOUNTER — Inpatient Hospital Stay (HOSPITAL_BASED_OUTPATIENT_CLINIC_OR_DEPARTMENT_OTHER): Payer: BC Managed Care – PPO | Admitting: Hematology

## 2019-07-12 ENCOUNTER — Other Ambulatory Visit: Payer: Self-pay

## 2019-07-12 VITALS — BP 129/79 | HR 66 | Temp 98.9°F | Resp 18 | Ht 64.0 in | Wt 155.8 lb

## 2019-07-12 DIAGNOSIS — G8929 Other chronic pain: Secondary | ICD-10-CM | POA: Insufficient documentation

## 2019-07-12 DIAGNOSIS — E559 Vitamin D deficiency, unspecified: Secondary | ICD-10-CM | POA: Insufficient documentation

## 2019-07-12 DIAGNOSIS — Z79899 Other long term (current) drug therapy: Secondary | ICD-10-CM | POA: Diagnosis not present

## 2019-07-12 DIAGNOSIS — R232 Flushing: Secondary | ICD-10-CM

## 2019-07-12 DIAGNOSIS — Z79811 Long term (current) use of aromatase inhibitors: Secondary | ICD-10-CM

## 2019-07-12 DIAGNOSIS — R202 Paresthesia of skin: Secondary | ICD-10-CM | POA: Insufficient documentation

## 2019-07-12 DIAGNOSIS — R718 Other abnormality of red blood cells: Secondary | ICD-10-CM | POA: Diagnosis not present

## 2019-07-12 DIAGNOSIS — D0512 Intraductal carcinoma in situ of left breast: Secondary | ICD-10-CM | POA: Diagnosis not present

## 2019-07-12 DIAGNOSIS — M25551 Pain in right hip: Secondary | ICD-10-CM

## 2019-07-12 DIAGNOSIS — M549 Dorsalgia, unspecified: Secondary | ICD-10-CM

## 2019-07-12 DIAGNOSIS — Z923 Personal history of irradiation: Secondary | ICD-10-CM | POA: Insufficient documentation

## 2019-07-12 DIAGNOSIS — H9191 Unspecified hearing loss, right ear: Secondary | ICD-10-CM

## 2019-07-12 DIAGNOSIS — E2839 Other primary ovarian failure: Secondary | ICD-10-CM

## 2019-07-12 LAB — CBC WITH DIFFERENTIAL/PLATELET
Abs Immature Granulocytes: 0.03 10*3/uL (ref 0.00–0.07)
Basophils Absolute: 0.1 10*3/uL (ref 0.0–0.1)
Basophils Relative: 1 %
Eosinophils Absolute: 0.8 10*3/uL — ABNORMAL HIGH (ref 0.0–0.5)
Eosinophils Relative: 9 %
HCT: 38.8 % (ref 36.0–46.0)
Hemoglobin: 13.2 g/dL (ref 12.0–15.0)
Immature Granulocytes: 0 %
Lymphocytes Relative: 20 %
Lymphs Abs: 1.8 10*3/uL (ref 0.7–4.0)
MCH: 26.5 pg (ref 26.0–34.0)
MCHC: 34 g/dL (ref 30.0–36.0)
MCV: 77.9 fL — ABNORMAL LOW (ref 80.0–100.0)
Monocytes Absolute: 0.7 10*3/uL (ref 0.1–1.0)
Monocytes Relative: 8 %
Neutro Abs: 5.6 10*3/uL (ref 1.7–7.7)
Neutrophils Relative %: 62 %
Platelets: 293 10*3/uL (ref 150–400)
RBC: 4.98 MIL/uL (ref 3.87–5.11)
RDW: 13.4 % (ref 11.5–15.5)
WBC: 8.9 10*3/uL (ref 4.0–10.5)
nRBC: 0 % (ref 0.0–0.2)

## 2019-07-12 LAB — CMP (CANCER CENTER ONLY)
ALT: 18 U/L (ref 0–44)
AST: 21 U/L (ref 15–41)
Albumin: 4.1 g/dL (ref 3.5–5.0)
Alkaline Phosphatase: 130 U/L — ABNORMAL HIGH (ref 38–126)
Anion gap: 9 (ref 5–15)
BUN: 13 mg/dL (ref 6–20)
CO2: 26 mmol/L (ref 22–32)
Calcium: 9.7 mg/dL (ref 8.9–10.3)
Chloride: 106 mmol/L (ref 98–111)
Creatinine: 0.93 mg/dL (ref 0.44–1.00)
GFR, Est AFR Am: >60 mL/min (ref >60)
GFR, Estimated: >60 mL/min (ref >60)
Glucose, Bld: 101 mg/dL — ABNORMAL HIGH (ref 70–99)
Potassium: 3.7 mmol/L (ref 3.5–5.1)
Sodium: 141 mmol/L (ref 135–145)
Total Bilirubin: 0.4 mg/dL (ref 0.3–1.2)
Total Protein: 8 g/dL (ref 6.5–8.1)

## 2019-07-12 LAB — IRON AND TIBC
Iron: 81 ug/dL (ref 41–142)
Saturation Ratios: 25 % (ref 21–57)
TIBC: 321 ug/dL (ref 236–444)
UIBC: 240 ug/dL (ref 120–384)

## 2019-07-12 LAB — FERRITIN: Ferritin: 176 ng/mL (ref 11–307)

## 2019-07-12 NOTE — Telephone Encounter (Signed)
Scheduled appt per 7/13 los. °Printed and mailed appt calendar. °

## 2019-07-16 ENCOUNTER — Telehealth: Payer: Self-pay

## 2019-07-16 NOTE — Telephone Encounter (Signed)
Spoke with patient regarding lab results.  Notified her that iron study and CMP were unremarkable, no concerns per Dr. Burr Medico.  Patient verbalized an understanding.

## 2019-07-16 NOTE — Telephone Encounter (Signed)
-----   Message from Truitt Merle, MD sent at 07/15/2019  8:18 AM EDT ----- Please let her know iron study and CMP were unremarkable, no concerns, thanks   Truitt Merle  07/15/2019

## 2019-08-09 ENCOUNTER — Other Ambulatory Visit: Payer: Self-pay

## 2019-08-09 ENCOUNTER — Ambulatory Visit
Admission: RE | Admit: 2019-08-09 | Discharge: 2019-08-09 | Disposition: A | Discharge status: Home / Self Care | Payer: BC Managed Care – PPO | Source: Ambulatory Visit | Attending: Family Medicine | Admitting: Family Medicine

## 2019-08-09 ENCOUNTER — Other Ambulatory Visit: Payer: Self-pay | Admitting: Family Medicine

## 2019-08-09 DIAGNOSIS — M25551 Pain in right hip: Secondary | ICD-10-CM

## 2019-08-24 ENCOUNTER — Other Ambulatory Visit: Payer: Self-pay | Admitting: Family Medicine

## 2019-08-24 DIAGNOSIS — M1611 Unilateral primary osteoarthritis, right hip: Secondary | ICD-10-CM

## 2019-08-24 DIAGNOSIS — M25551 Pain in right hip: Secondary | ICD-10-CM

## 2019-09-12 ENCOUNTER — Ambulatory Visit
Admission: RE | Admit: 2019-09-12 | Discharge: 2019-09-12 | Disposition: A | Discharge status: Home / Self Care | Payer: BC Managed Care – PPO | Source: Ambulatory Visit | Attending: Family Medicine | Admitting: Family Medicine

## 2019-09-12 ENCOUNTER — Other Ambulatory Visit: Payer: Self-pay

## 2019-09-12 DIAGNOSIS — M25551 Pain in right hip: Secondary | ICD-10-CM

## 2019-09-12 DIAGNOSIS — M1611 Unilateral primary osteoarthritis, right hip: Secondary | ICD-10-CM

## 2019-09-16 ENCOUNTER — Telehealth: Payer: Self-pay

## 2019-09-16 NOTE — Telephone Encounter (Signed)
Returned patient's call regarding restarting her Anastrozole. Patient stated her PCP ordered an MRI of her right hip which found a small tear to the cartilage in her hip. She stated she was instructed to follow up with an orthopedic surgeon but has not yet made the call. Per Dr. Ernestina Penna last office note I instructed her it was alright to restart her Anastrozole and we would adjust the end date to ensure she took it for the full 5 years. Patient denied any further needs and knows to call should she have any future questions/concerns

## 2019-11-30 DIAGNOSIS — K1120 Sialoadenitis, unspecified: Secondary | ICD-10-CM

## 2019-11-30 HISTORY — DX: Sialoadenitis, unspecified: K11.20

## 2019-12-13 ENCOUNTER — Other Ambulatory Visit: Payer: Self-pay

## 2019-12-13 ENCOUNTER — Encounter (HOSPITAL_COMMUNITY): Payer: Self-pay

## 2019-12-13 ENCOUNTER — Emergency Department (HOSPITAL_COMMUNITY)
Admission: EM | Admit: 2019-12-13 | Discharge: 2019-12-13 | Disposition: A | Discharge status: Home / Self Care | Payer: BC Managed Care – PPO | Attending: Emergency Medicine | Admitting: Emergency Medicine

## 2019-12-13 ENCOUNTER — Inpatient Hospital Stay (HOSPITAL_COMMUNITY)
Admission: AD | Admit: 2019-12-13 | Discharge: 2019-12-19 | DRG: 155 | Disposition: A | Discharge status: Home / Self Care | Payer: BC Managed Care – PPO | Source: Ambulatory Visit | Attending: Otolaryngology | Admitting: Otolaryngology

## 2019-12-13 DIAGNOSIS — R221 Localized swelling, mass and lump, neck: Secondary | ICD-10-CM | POA: Insufficient documentation

## 2019-12-13 DIAGNOSIS — K122 Cellulitis and abscess of mouth: Secondary | ICD-10-CM | POA: Diagnosis present

## 2019-12-13 DIAGNOSIS — Z79899 Other long term (current) drug therapy: Secondary | ICD-10-CM

## 2019-12-13 DIAGNOSIS — J45909 Unspecified asthma, uncomplicated: Secondary | ICD-10-CM | POA: Diagnosis present

## 2019-12-13 DIAGNOSIS — Z9049 Acquired absence of other specified parts of digestive tract: Secondary | ICD-10-CM

## 2019-12-13 DIAGNOSIS — K1120 Sialoadenitis, unspecified: Secondary | ICD-10-CM | POA: Diagnosis present

## 2019-12-13 DIAGNOSIS — L0291 Cutaneous abscess, unspecified: Secondary | ICD-10-CM

## 2019-12-13 DIAGNOSIS — Z803 Family history of malignant neoplasm of breast: Secondary | ICD-10-CM

## 2019-12-13 DIAGNOSIS — K1121 Acute sialoadenitis: Principal | ICD-10-CM | POA: Diagnosis present

## 2019-12-13 DIAGNOSIS — Z5321 Procedure and treatment not carried out due to patient leaving prior to being seen by health care provider: Secondary | ICD-10-CM | POA: Insufficient documentation

## 2019-12-13 DIAGNOSIS — K113 Abscess of salivary gland: Secondary | ICD-10-CM | POA: Diagnosis present

## 2019-12-13 DIAGNOSIS — Z800 Family history of malignant neoplasm of digestive organs: Secondary | ICD-10-CM

## 2019-12-13 DIAGNOSIS — K115 Sialolithiasis: Secondary | ICD-10-CM | POA: Diagnosis present

## 2019-12-13 DIAGNOSIS — Z79811 Long term (current) use of aromatase inhibitors: Secondary | ICD-10-CM

## 2019-12-13 DIAGNOSIS — K219 Gastro-esophageal reflux disease without esophagitis: Secondary | ICD-10-CM | POA: Diagnosis present

## 2019-12-13 DIAGNOSIS — Z853 Personal history of malignant neoplasm of breast: Secondary | ICD-10-CM

## 2019-12-13 DIAGNOSIS — Z20828 Contact with and (suspected) exposure to other viral communicable diseases: Secondary | ICD-10-CM | POA: Diagnosis present

## 2019-12-13 DIAGNOSIS — Z7951 Long term (current) use of inhaled steroids: Secondary | ICD-10-CM

## 2019-12-13 LAB — CBC WITH DIFFERENTIAL/PLATELET
Abs Immature Granulocytes: 0.06 10*3/uL (ref 0.00–0.07)
Abs Immature Granulocytes: 0.06 10*3/uL (ref 0.00–0.07)
Basophils Absolute: 0.1 10*3/uL (ref 0.0–0.1)
Basophils Absolute: 0.1 10*3/uL (ref 0.0–0.1)
Basophils Relative: 0 %
Basophils Relative: 0 %
Eosinophils Absolute: 0.4 10*3/uL (ref 0.0–0.5)
Eosinophils Absolute: 0.3 10*3/uL (ref 0.0–0.5)
Eosinophils Relative: 2 %
Eosinophils Relative: 2 %
HCT: 36 % (ref 36.0–46.0)
HCT: 34.5 % — ABNORMAL LOW (ref 36.0–46.0)
Hemoglobin: 12.9 g/dL (ref 12.0–15.0)
Hemoglobin: 12.2 g/dL (ref 12.0–15.0)
Immature Granulocytes: 0 %
Immature Granulocytes: 0 %
Lymphocytes Relative: 10 %
Lymphocytes Relative: 7 %
Lymphs Abs: 1.6 10*3/uL (ref 0.7–4.0)
Lymphs Abs: 1.1 10*3/uL (ref 0.7–4.0)
MCH: 27.7 pg (ref 26.0–34.0)
MCH: 27.4 pg (ref 26.0–34.0)
MCHC: 35.8 g/dL (ref 30.0–36.0)
MCHC: 35.4 g/dL (ref 30.0–36.0)
MCV: 77.4 fL — ABNORMAL LOW (ref 80.0–100.0)
MCV: 77.5 fL — ABNORMAL LOW (ref 80.0–100.0)
Monocytes Absolute: 1.6 10*3/uL — ABNORMAL HIGH (ref 0.1–1.0)
Monocytes Absolute: 1.4 10*3/uL — ABNORMAL HIGH (ref 0.1–1.0)
Monocytes Relative: 10 %
Monocytes Relative: 9 %
Neutro Abs: 12.9 10*3/uL — ABNORMAL HIGH (ref 1.7–7.7)
Neutro Abs: 12.3 10*3/uL — ABNORMAL HIGH (ref 1.7–7.7)
Neutrophils Relative %: 78 %
Neutrophils Relative %: 82 %
Platelets: 284 10*3/uL (ref 150–400)
Platelets: 289 10*3/uL (ref 150–400)
RBC: 4.65 MIL/uL (ref 3.87–5.11)
RBC: 4.45 MIL/uL (ref 3.87–5.11)
RDW: 13.2 % (ref 11.5–15.5)
RDW: 13.2 % (ref 11.5–15.5)
WBC: 16.6 10*3/uL — ABNORMAL HIGH (ref 4.0–10.5)
WBC: 15.2 10*3/uL — ABNORMAL HIGH (ref 4.0–10.5)
nRBC: 0 % (ref 0.0–0.2)
nRBC: 0 % (ref 0.0–0.2)

## 2019-12-13 LAB — COMPREHENSIVE METABOLIC PANEL
ALT: 20 U/L (ref 0–44)
AST: 19 U/L (ref 15–41)
Albumin: 3.9 g/dL (ref 3.5–5.0)
Alkaline Phosphatase: 119 U/L (ref 38–126)
Anion gap: 13 (ref 5–15)
BUN: 15 mg/dL (ref 6–20)
CO2: 24 mmol/L (ref 22–32)
Calcium: 9.5 mg/dL (ref 8.9–10.3)
Chloride: 100 mmol/L (ref 98–111)
Creatinine, Ser: 1.01 mg/dL — ABNORMAL HIGH (ref 0.44–1.00)
GFR calc Af Amer: >60 mL/min (ref >60)
GFR calc non Af Amer: >60 mL/min (ref >60)
Glucose, Bld: 109 mg/dL — ABNORMAL HIGH (ref 70–99)
Potassium: 3.7 mmol/L (ref 3.5–5.1)
Sodium: 137 mmol/L (ref 135–145)
Total Bilirubin: 1.3 mg/dL — ABNORMAL HIGH (ref 0.3–1.2)
Total Protein: 7.8 g/dL (ref 6.5–8.1)

## 2019-12-13 LAB — HIV ANTIBODY (ROUTINE TESTING W REFLEX): HIV Screen 4th Generation wRfx: NONREACTIVE

## 2019-12-13 LAB — LACTIC ACID, PLASMA: Lactic Acid, Venous: 1.2 mmol/L (ref 0.5–1.9)

## 2019-12-13 MED ORDER — HYDROCODONE-ACETAMINOPHEN 5-325 MG PO TABS
1.0000 | ORAL_TABLET | ORAL | Status: DC | PRN
  Administered 2019-12-13 – 2019-12-19 (×15): 1 via ORAL
  Filled 2019-12-13 (×15): qty 1

## 2019-12-13 MED ORDER — ANASTROZOLE 1 MG PO TABS
1.0000 mg | ORAL_TABLET | Freq: Every day | ORAL | Status: DC
  Filled 2019-12-13 (×4): qty 1

## 2019-12-13 MED ORDER — CALCIUM CARBONATE-VITAMIN D 500-200 MG-UNIT PO TABS
1.0000 | ORAL_TABLET | Freq: Every day | ORAL | Status: DC
  Administered 2019-12-19: 09:00:00 1 via ORAL
  Filled 2019-12-13 (×4): qty 1

## 2019-12-13 MED ORDER — ALBUTEROL SULFATE (2.5 MG/3ML) 0.083% IN NEBU: 3.0000 mL | INHALATION_SOLUTION | Freq: Four times a day (QID) | RESPIRATORY_TRACT | Status: DC | PRN

## 2019-12-13 MED ORDER — ONDANSETRON HCL 4 MG/2ML IJ SOLN
4.0000 mg | Freq: Four times a day (QID) | INTRAMUSCULAR | Status: DC | PRN
  Administered 2019-12-14 (×2): 4 mg via INTRAVENOUS
  Filled 2019-12-13 (×2): qty 2

## 2019-12-13 MED ORDER — IBUPROFEN 800 MG PO TABS: 800.0000 mg | ORAL_TABLET | Freq: Three times a day (TID) | ORAL | Status: DC | PRN

## 2019-12-13 MED ORDER — MORPHINE SULFATE (PF) 2 MG/ML IV SOLN
2.0000 mg | INTRAVENOUS | Status: DC | PRN
  Administered 2019-12-14 – 2019-12-16 (×12): 2 mg via INTRAVENOUS
  Filled 2019-12-13 (×8): qty 1
  Filled 2019-12-13: qty 2
  Filled 2019-12-13 (×3): qty 1

## 2019-12-13 MED ORDER — ONDANSETRON 4 MG PO TBDP: 4.0000 mg | ORAL_TABLET | Freq: Four times a day (QID) | ORAL | Status: DC | PRN

## 2019-12-13 MED ORDER — MOMETASONE FURO-FORMOTEROL FUM 200-5 MCG/ACT IN AERO
2.0000 | INHALATION_SPRAY | Freq: Two times a day (BID) | RESPIRATORY_TRACT | Status: DC
  Administered 2019-12-13 – 2019-12-19 (×12): 2 via RESPIRATORY_TRACT
  Filled 2019-12-13: qty 8.8

## 2019-12-13 MED ORDER — DEXTROSE-NACL 5-0.45 % IV SOLN: INTRAVENOUS | Status: DC

## 2019-12-13 MED ORDER — VITAMIN D 25 MCG (1000 UNIT) PO TABS
2000.0000 [IU] | ORAL_TABLET | Freq: Every day | ORAL | Status: DC
  Administered 2019-12-19: 2000 [IU] via ORAL
  Filled 2019-12-13 (×4): qty 2

## 2019-12-13 MED ORDER — MOMETASONE FURO-FORMOTEROL FUM 100-5 MCG/ACT IN AERO: 2.0000 | INHALATION_SPRAY | Freq: Two times a day (BID) | RESPIRATORY_TRACT | Status: DC

## 2019-12-13 MED ORDER — SODIUM CHLORIDE 0.9 % IV SOLN
3.0000 g | Freq: Four times a day (QID) | INTRAVENOUS | Status: DC
  Administered 2019-12-13 – 2019-12-17 (×15): 3 g via INTRAVENOUS
  Filled 2019-12-13: qty 8
  Filled 2019-12-13 (×2): qty 3
  Filled 2019-12-13: qty 8
  Filled 2019-12-13 (×2): qty 3
  Filled 2019-12-13 (×5): qty 8
  Filled 2019-12-13 (×2): qty 3
  Filled 2019-12-13: qty 8
  Filled 2019-12-13: qty 3
  Filled 2019-12-13: qty 8
  Filled 2019-12-13 (×3): qty 3

## 2019-12-13 MED ORDER — DEXTROSE-NACL 5-0.45 % IV SOLN
INTRAVENOUS | Status: DC
  Administered 2019-12-13: 20:00:00 via INTRAVENOUS

## 2019-12-13 MED ORDER — MOMETASONE FURO-FORMOTEROL FUM 200-5 MCG/ACT IN AERO: 2.0000 | INHALATION_SPRAY | Freq: Two times a day (BID) | RESPIRATORY_TRACT | Status: DC

## 2019-12-13 MED ORDER — RA SENNA PLUS PO
100.00 | ORAL | Status: DC
Start: ? — End: 2019-12-13

## 2019-12-13 NOTE — Progress Notes (Signed)
Patient admitted to unit as a direct admit, VSS. Patient settled in bed and oriented to unit and hospital routine. Pt sitting comfortably in chair with call bell in reach and side rails up. Suction set up at bedside for patient due to swelling and pain in neck/throat. Instructed patient to utilize call bell for assistance. Will continue to monitor closely for remainder of shift.

## 2019-12-13 NOTE — ED Triage Notes (Signed)
Pt sent here by ENT due to salivary gland infection. Pt having pain and swelling for the past week. Pt had Ct scan done at a [facility] that showed a gland stone. Pain when swallowing. Airway intact at this time.

## 2019-12-13 NOTE — H&P (Signed)
Cynthia Ford is an 58 y.o. female.   Chief Complaint: neck swelling HPI: hx of submandinular swelling and now with worst one ever. She is having swallowing issues and pain. Right side swollen   Past Medical History:  Diagnosis Date  . Allergy   . Anemia   . Anxiety   . Asthma   . Breast cancer ([place]) 2016   Left Breast Cancer  . GERD (gastroesophageal reflux disease)   . Papilloma of left breast   . Personal history of radiation therapy   . PONV (postoperative nausea and vomiting)     Past Surgical History:  Procedure Laterality Date  . BREAST EXCISIONAL BIOPSY Right 02/17/2018  . BREAST EXCISIONAL BIOPSY Left   . BREAST LUMPECTOMY Left 2016  . BREAST LUMPECTOMY WITH RADIOACTIVE SEED LOCALIZATION Left 02/14/2015   Procedure: BREAST LUMPECTOMY WITH RADIOACTIVE SEED LOCALIZATION;  Surgeon: Erroll Luna, MD;  Location: [place] [facility];  Service: General;  Laterality: Left;  . BREAST LUMPECTOMY WITH RADIOACTIVE SEED LOCALIZATION Right 02/17/2018   Procedure: BREAST LUMPECTOMY WITH RADIOACTIVE SEED LOCALIZATION;  Surgeon: Erroll Luna, MD;  Location: [facility];  Service: General;  Laterality: Right;  . BREAST SURGERY  2010   LEFT  . CHOLECYSTECTOMY  2001  . KNEE ARTHROSCOPY Left 1999/1993  . NASAL PASSAGE  2000  . NASAL SEPTUM SURGERY      Family History  Problem Relation Age of Onset  . Breast cancer Other 5       niece  . Prostate cancer Brother 55  . Prostate cancer Brother 66  . Prostate cancer Maternal Grandfather 78.  . Colon polyps Mother   . Colon polyps Sister   . Rectal cancer Paternal Aunt   . Colon cancer Maternal Grandmother   . Esophageal cancer Neg Hx   . Stomach cancer Neg Hx    Social History:  reports that she has never smoked. She has never used smokeless tobacco. She reports that she does not drink alcohol or use drugs.  Allergies: No Known Allergies  Medications Prior to Admission  Medication Sig Dispense Refill  .  albuterol (PROVENTIL HFA;VENTOLIN HFA) 108 (90 BASE) MCG/ACT inhaler Inhale into the lungs every 6 (six) hours as needed for wheezing or shortness of breath.    . anastrozole (ARIMIDEX) 1 MG tablet TAKE 1 TABLET BY MOUTH EVERY DAY 90 tablet 3  . Ascorbic Acid (VITA-C PO) Take by mouth.    . budesonide-formoterol (SYMBICORT) 160-4.5 MCG/ACT inhaler     . CALCIUM PO Take 600 mg by mouth.    . calcium-vitamin D (OSCAL WITH D) 500-200 MG-UNIT tablet Take 1 tablet by mouth daily.    . cetirizine (ZYRTEC) 10 MG tablet Take 10 mg by mouth daily.      . cholecalciferol (VITAMIN D) 1000 units tablet Take 2,000 Units by mouth daily.    . DULERA 100-5 MCG/ACT AERO Inhale 2 puffs into the lungs 2 (two) times daily.  11  . Fluticasone-Salmeterol (ADVAIR DISKUS) 250-50 MCG/DOSE AEPB TAKE 1 PUFF BY MOUTH TWICE A DAY    . ibuprofen (ADVIL,MOTRIN) 800 MG tablet Take 1 tablet (800 mg total) by mouth every 8 (eight) hours as needed. 30 tablet 0    Results for orders placed or performed during the hospital encounter of 12/13/19 (from the past 48 hour(s))  Lactic acid, plasma     Status: None   Collection Time: 12/13/19  3:05 PM  Result Value Ref Range   Lactic Acid, Venous 1.2  0.5 - 1.9 mmol/L    Comment: Performed at [facility], [place] [address], [place], [place] [address]  Comprehensive metabolic panel     Status: Abnormal   Collection Time: 12/13/19  3:05 PM  Result Value Ref Range   Sodium 137 135 - 145 mmol/L   Potassium 3.7 3.5 - 5.1 mmol/L   Chloride 100 98 - 111 mmol/L   CO2 24 22 - 32 mmol/L   Glucose, Bld 109 (H) 70 - 99 mg/dL   BUN 15 6 - 20 mg/dL   Creatinine, Ser 1.01 (H) 0.44 - 1.00 mg/dL   Calcium 9.5 8.9 - 10.3 mg/dL   Total Protein 7.8 6.5 - 8.1 g/dL   Albumin 3.9 3.5 - 5.0 g/dL   AST 19 15 - 41 U/L   ALT 20 0 - 44 U/L   Alkaline Phosphatase 119 38 - 126 U/L   Total Bilirubin 1.3 (H) 0.3 - 1.2 mg/dL   GFR calc non Af Amer >60 >60 mL/min   GFR calc Af Amer >60 >60 mL/min    Anion gap 13 5 - 15    Comment: Performed at [facility] [address], [place], [place] [address]  CBC with Differential     Status: Abnormal   Collection Time: 12/13/19  3:05 PM  Result Value Ref Range   WBC 16.6 (H) 4.0 - 10.5 K/uL   RBC 4.65 3.87 - 5.11 MIL/uL   Hemoglobin 12.9 12.0 - 15.0 g/dL   HCT 36.0 36.0 - 46.0 %   MCV 77.4 (L) 80.0 - 100.0 fL   MCH 27.7 26.0 - 34.0 pg   MCHC 35.8 30.0 - 36.0 g/dL   RDW 13.2 11.5 - 15.5 %   Platelets 284 150 - 400 K/uL   nRBC 0.0 0.0 - 0.2 %   Neutrophils Relative % 78 %   Neutro Abs 12.9 (H) 1.7 - 7.7 K/uL   Lymphocytes Relative 10 %   Lymphs Abs 1.6 0.7 - 4.0 K/uL   Monocytes Relative 10 %   Monocytes Absolute 1.6 (H) 0.1 - 1.0 K/uL   Eosinophils Relative 2 %   Eosinophils Absolute 0.4 0.0 - 0.5 K/uL   Basophils Relative 0 %   Basophils Absolute 0.1 0.0 - 0.1 K/uL   Immature Granulocytes 0 %   Abs Immature Granulocytes 0.06 0.00 - 0.07 K/uL    Comment: Performed at [facility], [place] [address], [place], [place] [address]   No results found.   Blood pressure 126/81, pulse 95, temperature 100 F (37.8 C), temperature source Oral, SpO2 100 %. Physical Exam  Constitutional: She appears well-developed and well-nourished.  HENT:  Head: Normocephalic and atraumatic.  Swelling od right wharthins duct with pus. Neck tender in the right submandinular area. Tongue normal.   Eyes: Pupils are equal, round, and reactive to light. Conjunctivae are normal.  Cardiovascular: Normal rate.  Respiratory: Effort normal.  GI: Soft.  Musculoskeletal:        General: Normal range of motion.     Cervical back: Normal range of motion and neck supple.     Assessment/Plan Right submandinular sailadenitis- she will be on IV abx and hopefully improves quickly.   Melissa Montane, MD 12/13/2019, 8:16 PM

## 2019-12-13 NOTE — Plan of Care (Signed)
  Problem: Elimination: Goal: Will not experience complications related to urinary retention Outcome: Progressing   Problem: Safety: Goal: Ability to remain free from injury will improve Outcome: Progressing   Problem: Skin Integrity: Goal: Risk for impaired skin integrity will decrease Outcome: Progressing   

## 2019-12-13 NOTE — Progress Notes (Signed)
Pharmacy Antibiotic Note  Cynthia Ford is a 58 y.o. female admitted on 12/13/2019 with Salivary gland infection (sent by ENT). .  Pharmacy has been consulted for Unasyn dosing.  -White blood cell count is elevated. Lactic acid is normal.  -Patient is currently afebrile. -SCr is up at 1.01 with estimated CrCl ~ 65-70 mL/min.   Plan: Unasyn 3g IV every 6 hours.  Monitor renal function and adjust dosing as needed.      Temp (24hrs), Avg:98.6 F (37 C), Min:98.5 F (36.9 C), Max:98.7 F (37.1 C)  Recent Labs  Lab 12/13/19 1505  WBC 16.6*  CREATININE 1.01*  LATICACIDVEN 1.2    CrCl cannot be calculated (Unknown ideal weight.).    No Known Allergies  Antimicrobials this admission: Unasyn 12/14 >>  Dose adjustments this admission:   Microbiology results: None at this time  Thank you for allowing pharmacy to be a part of this patient's care.  Sloan Leiter, PharmD, BCPS, BCCCP Clinical Pharmacist Please refer to [facility] for [place] numbers 12/13/2019 5:46 PM

## 2019-12-14 ENCOUNTER — Observation Stay (HOSPITAL_COMMUNITY): Payer: BC Managed Care – PPO

## 2019-12-14 ENCOUNTER — Encounter (HOSPITAL_COMMUNITY): Payer: Self-pay | Admitting: Otolaryngology

## 2019-12-14 LAB — SARS CORONAVIRUS 2 (TAT 6-24 HRS): SARS Coronavirus 2: NEGATIVE

## 2019-12-14 MED ORDER — IOHEXOL 300 MG/ML  SOLN
75.0000 mL | Freq: Once | INTRAMUSCULAR | Status: AC | PRN
  Administered 2019-12-14: 75 mL via INTRAVENOUS

## 2019-12-14 NOTE — Plan of Care (Signed)
  Problem: Safety: Goal: Ability to remain free from injury will improve Outcome: Progressing   Problem: Skin Integrity: Goal: Risk for impaired skin integrity will decrease Outcome: Progressing   Problem: Pain Managment: Goal: General experience of comfort will improve Outcome: Progressing   

## 2019-12-14 NOTE — Progress Notes (Signed)
Called and notified MD regarding patient complaint of her neck feeling more firm and swollen than earlier this morning. Patient is concerned that it is feeling warmer also. Vitals have been stable, afebrile. Swelling is moving more medial and to the left side of nick. Pain is still present 9/10, medicated prn for pain as ordered. Ice packs applied prn. New orders received for CT scan with contrast of neck. Will continue to monitor and report to oncoming nursing staff.

## 2019-12-14 NOTE — Progress Notes (Signed)
   Subjective/Chief Complaint: Feeling better this morning.   Objective: Vital signs in last 24 hours: Temp:  [98.5 F (36.9 C)-100.7 F (38.2 C)] 98.8 F (37.1 C) (12/15 0532) Pulse Rate:  [88-103] 89 (12/15 0532) Resp:  [13-18] 13 (12/15 0532) BP: (114-127)/(69-87) 119/71 (12/15 0532) SpO2:  [97 %-100 %] 97 % (12/15 0532) Last BM Date: 12/13/19  Intake/Output from previous day: 12/14 0701 - 12/15 0700 In: 832.1 [I.V.:732.1; IV Piggyback:100] Out: -  Intake/Output this shift: No intake/output data recorded.  The exam is about the same with the same amount of neck swelling.  The floor mouth still has the Wharton's duct swollen possibly slightly better.  Lab Results:  Recent Labs    12/13/19 1505 12/13/19 2140  WBC 16.6* 15.2*  HGB 12.9 12.2  HCT 36.0 34.5*  PLT 284 289   BMET Recent Labs    12/13/19 1505  NA 137  K 3.7  CL 100  CO2 24  GLUCOSE 109*  BUN 15  CREATININE 1.01*  CALCIUM 9.5   PT/INR No results for input(s): LABPROT, INR in the last 72 hours. ABG No results for input(s): PHART, HCO3 in the last 72 hours.  Invalid input(s): PCO2, PO2  Studies/Results: No results found.  Anti-infectives: Anti-infectives (From admission, onward)   Start     Dose/Rate Route Frequency Ordered Stop   12/13/19 1800  Ampicillin-Sulbactam (UNASYN) 3 g in sodium chloride 0.9 % 100 mL IVPB     3 g 200 mL/hr over 30 Minutes Intravenous Every 6 hours 12/13/19 1750        Assessment/Plan: s/p * No surgery found * Continues with the submandibular sialadenitis with extensive cellulitis.  She is improved morning.  She feels like she can swallow slightly better.  We will continue the intravenous antibiotics and observation until she has substantially improved and able to swallow solids.  LOS: 1 day    Cynthia Ford 12/14/2019

## 2019-12-15 DIAGNOSIS — Z9049 Acquired absence of other specified parts of digestive tract: Secondary | ICD-10-CM | POA: Diagnosis not present

## 2019-12-15 DIAGNOSIS — K219 Gastro-esophageal reflux disease without esophagitis: Secondary | ICD-10-CM | POA: Diagnosis present

## 2019-12-15 DIAGNOSIS — Z20828 Contact with and (suspected) exposure to other viral communicable diseases: Secondary | ICD-10-CM | POA: Diagnosis present

## 2019-12-15 DIAGNOSIS — K115 Sialolithiasis: Secondary | ICD-10-CM | POA: Diagnosis present

## 2019-12-15 DIAGNOSIS — Z803 Family history of malignant neoplasm of breast: Secondary | ICD-10-CM | POA: Diagnosis not present

## 2019-12-15 DIAGNOSIS — Z79899 Other long term (current) drug therapy: Secondary | ICD-10-CM | POA: Diagnosis not present

## 2019-12-15 DIAGNOSIS — K122 Cellulitis and abscess of mouth: Secondary | ICD-10-CM | POA: Diagnosis present

## 2019-12-15 DIAGNOSIS — Z853 Personal history of malignant neoplasm of breast: Secondary | ICD-10-CM | POA: Diagnosis not present

## 2019-12-15 DIAGNOSIS — K1121 Acute sialoadenitis: Secondary | ICD-10-CM | POA: Diagnosis present

## 2019-12-15 DIAGNOSIS — Z79811 Long term (current) use of aromatase inhibitors: Secondary | ICD-10-CM | POA: Diagnosis not present

## 2019-12-15 DIAGNOSIS — J45909 Unspecified asthma, uncomplicated: Secondary | ICD-10-CM | POA: Diagnosis present

## 2019-12-15 DIAGNOSIS — K113 Abscess of salivary gland: Secondary | ICD-10-CM | POA: Diagnosis present

## 2019-12-15 DIAGNOSIS — K1120 Sialoadenitis, unspecified: Secondary | ICD-10-CM | POA: Diagnosis present

## 2019-12-15 DIAGNOSIS — Z800 Family history of malignant neoplasm of digestive organs: Secondary | ICD-10-CM | POA: Diagnosis not present

## 2019-12-15 DIAGNOSIS — Z7951 Long term (current) use of inhaled steroids: Secondary | ICD-10-CM | POA: Diagnosis not present

## 2019-12-15 NOTE — Consult Note (Signed)
Chief Complaint: Patient was seen in consultation today for image guided submandibular fluid collection aspiration.  Referring Physician(s): Dr. Melissa Montane  Supervising Physician: Jacqulynn Cadet  Patient Status: [facility] - In-pt  History of Present Illness: Cynthia Ford is a 58 y.o. female with a past medical history significant for anxiety, asthma, anemia, GERD, breast cancer (2016) and submandibular swelling with difficulty swallowing followed by ENT who was admitted by their service on 12/14 for IV antibiotic therapy. CT soft tissue neck w/contrast was performed on 12/15 which was notable for a 6 mm obstructive stone the hilum of the right submandibular gland with associated 1.5 x 3.4 x 2.8 cm rim enhancing collection extending medially and inferiorly towards the right sublingual space which could reflect a pseudocyst or abscess (or infected pseudocyst), associated inflammatory stranding within the adjacent right submandibular gland and space consistent with associated sialoadenitis and regional cellulitis. IR has been asked to perform an image guided aspiration and decompression of this fluid collection to further guide management.  Patient sitting up in bed, trying to eat but having difficulty chewing and swallowing due to pain. Family member at bedside concerned about her pain and what is causing this swelling. Discussed aspiration procedure with moderate sedation and patient would like to proceed.   Past Medical History:  Diagnosis Date   Allergy    Anemia    Anxiety    Asthma    Breast cancer ([place]) 2016   Left Breast Cancer   GERD (gastroesophageal reflux disease)    Papilloma of left breast    Personal history of radiation therapy    PONV (postoperative nausea and vomiting)    Salivary gland infection 11/2019    Past Surgical History:  Procedure Laterality Date   BREAST EXCISIONAL BIOPSY Right 02/17/2018   BREAST EXCISIONAL BIOPSY Left    BREAST  LUMPECTOMY Left 2016   BREAST LUMPECTOMY WITH RADIOACTIVE SEED LOCALIZATION Left 02/14/2015   Procedure: BREAST LUMPECTOMY WITH RADIOACTIVE SEED LOCALIZATION;  Surgeon: Erroll Luna, MD;  Location: [facility];  Service: General;  Laterality: Left;   BREAST LUMPECTOMY WITH RADIOACTIVE SEED LOCALIZATION Right 02/17/2018   Procedure: BREAST LUMPECTOMY WITH RADIOACTIVE SEED LOCALIZATION;  Surgeon: Erroll Luna, MD;  Location: [facility];  Service: General;  Laterality: Right;   BREAST SURGERY  2010   LEFT   CHOLECYSTECTOMY  2001   KNEE ARTHROSCOPY Left 1999/1993   NASAL PASSAGE  2000   NASAL SEPTUM SURGERY     TYMPANOSTOMY TUBE PLACEMENT  12/2018    Allergies: Patient has no known allergies.  Medications: Prior to Admission medications   Medication Sig Start Date End Date Taking? Authorizing Provider  albuterol (PROVENTIL HFA;VENTOLIN HFA) 108 (90 BASE) MCG/ACT inhaler Inhale into the lungs every 6 (six) hours as needed for wheezing or shortness of breath.   Yes [provider]  amoxicillin-clavulanate (AUGMENTIN) 875-125 MG tablet Take 1 tablet by mouth 2 (two) times daily.   Yes [provider]  anastrozole (ARIMIDEX) 1 MG tablet TAKE 1 TABLET BY MOUTH EVERY DAY Patient taking differently: Take 1 mg by mouth daily.  03/18/19  Yes Truitt Merle, MD  Ascorbic Acid (VITA-C PO) Take 1 tablet by mouth daily.    Yes [provider]  CALCIUM PO Take 600 mg by mouth daily.    Yes [provider]  calcium-vitamin D (OSCAL WITH D) 500-200 MG-UNIT tablet Take 1 tablet by mouth daily.   Yes [provider]  cetirizine (ZYRTEC) 10 MG  tablet Take 10 mg by mouth daily.     Yes [provider]  cholecalciferol (VITAMIN D) 1000 units tablet Take 2,000 Units by mouth daily.   Yes [provider]  Fluticasone-Salmeterol (ADVAIR DISKUS) 250-50 MCG/DOSE AEPB Inhale 1 puff into the lungs 2 (two) times daily.   07/01/18  Yes [provider]  ibuprofen (ADVIL,MOTRIN) 800 MG tablet Take 1 tablet (800 mg total) by mouth every 8 (eight) hours as needed. 02/17/18  Yes Cornett, [name], MD  naproxen (NAPROSYN) 500 MG tablet Take 500 mg by mouth 2 (two) times daily as needed for mild pain.   Yes [provider]     Family History  Problem Relation Age of Onset   Breast cancer Other 79       niece   Prostate cancer Brother 14   Prostate cancer Brother 50   Prostate cancer Maternal Grandfather 23.   Colon polyps Mother    Colon polyps Sister    Rectal cancer Paternal Aunt    Colon cancer Maternal Grandmother    Esophageal cancer Neg Hx    Stomach cancer Neg Hx     Social History   Socioeconomic History   Marital status: Single    Spouse name: Not on file   Number of children: 0   Years of education: Not on file   Highest education level: Not on file  Occupational History   Occupation: Architect  Tobacco Use   Smoking status: Never Smoker   Smokeless tobacco: Never Used  Substance and Sexual Activity   Alcohol use: No    Alcohol/week: 0.0 standard drinks   Drug use: No   Sexual activity: Yes    Birth control/protection: Post-menopausal  Other Topics Concern   Not on file  Social History Narrative   Not on file   Social Determinants of Health   Financial Resource Strain:    Difficulty of Paying Living Expenses: Not on file  Food Insecurity:    Worried About Charity fundraiser in the Last Year: Not on file   [employer] of Food in the Last Year: Not on file  Transportation Needs:    Lack of Transportation (Medical): Not on file   Lack of Transportation (Non-Medical): Not on file  Physical Activity:    Days of Exercise per Week: Not on file   Minutes of Exercise per Session: Not on file  Stress:    Feeling of Stress : Not on file  Social Connections:    Frequency of Communication with Friends and Family: Not on file   Frequency  of Social Gatherings with Friends and Family: Not on file   Attends Religious Services: Not on file   Active Member of Clubs or Organizations: Not on file   Attends Archivist Meetings: Not on file   Marital Status: Not on file     Review of Systems: A 12 point ROS discussed and pertinent positives are indicated in the HPI above.  All other systems are negative.  Review of Systems  Constitutional: Positive for appetite change, fatigue and fever.  HENT: Positive for facial swelling, sore throat and trouble swallowing.   Respiratory: Negative for cough and shortness of breath.   Cardiovascular: Negative for chest pain.  Gastrointestinal: Negative for abdominal pain, diarrhea, nausea and vomiting.  Neurological: Negative for dizziness and headaches.    Vital Signs: BP (!) 145/85 (BP Location: Left Arm)    Pulse 83    Temp 100.1 F (  37.8 C) (Oral)    Resp 18    Wt 165 lb 2 oz (74.9 kg)    SpO2 100%    BMI 28.34 kg/m   Physical Exam Vitals and nursing note reviewed.  Constitutional:      General: She is not in acute distress.    Appearance: She is ill-appearing.  HENT:     Head: Normocephalic.  Neck:     Comments: (+) significant submandibular and right parotid swelling; painful to palpation.  Cardiovascular:     Rate and Rhythm: Normal rate and regular rhythm.  Pulmonary:     Effort: Pulmonary effort is normal.     Breath sounds: Normal breath sounds.  Abdominal:     General: There is no distension.     Palpations: Abdomen is soft.  Skin:    General: Skin is warm and dry.  Neurological:     Mental Status: She is alert and oriented to person, place, and time.  Psychiatric:        Mood and Affect: Mood normal.        Behavior: Behavior normal.        Thought Content: Thought content normal.        Judgment: Judgment normal.      MD Evaluation Airway: (Difficult to assess due to pain with opening jaw; significant submandibular/parotid swelling) Heart:  WNL Abdomen: WNL Chest/ Lungs: WNL ASA  Classification: 2 Mallampati/Airway Score: Two   Imaging: CT SOFT TISSUE NECK W CONTRAST  Result Date: 12/14/2019 CLINICAL DATA:  Initial evaluation for neck pain with swelling to left side of neck. EXAM: CT NECK WITH CONTRAST TECHNIQUE: Multidetector CT imaging of the neck was performed using the standard protocol following the bolus administration of intravenous contrast. CONTRAST:  47mL OMNIPAQUE IOHEXOL 300 MG/ML  SOLN COMPARISON:  None. FINDINGS: Pharynx and larynx: Inflammatory process centered at the right sublingual and submandibular space, described below. Oral cavity otherwise within normal limits. Mild asymmetric enlargement of the right palatine tonsil as compared to the left, suspected to be reactive. Mild induration within the adjacent right parapharyngeal fat related to the inflammatory process within the adjacent right submandibular space. Associated mild asymmetric fullness of the right oropharynx and nasopharynx without discrete mass. No retropharyngeal collection. No overt evidence for acute epiglottitis. Vallecula effaced. Remainder of the hypopharynx and supraglottic larynx within normal limits. Glottis within normal limits. Subglottic airway clear. Salivary glands: Parotid glands are within normal limits bilaterally. 7 mm enhancing nodule at the posterior right parotid gland felt to be most consistent with a small intraparotid lymph node (series 3, image 27). Right submandibular gland asymmetrically enlarged and edematous in appearance, suggesting acute solid adenitis. There is a 6 mm obstructive stone at the hilum of the right submandibular gland (series 3, image 43). Dilatation of the intraglandular ducts seen proximally. Distally, there is a well-circumscribed hypodense rim enhancing collection extending from the hilum of the right submandibular gland to extend medially and inferiorly towards the right sublingual space measuring  approximately 1.5 x 3.4 x 2.8 cm (series 3, image 44). Finding could reflect a pseudocyst or abscess (or infected pseudocyst). Lesion appears intimately associated with the mylohyoid muscle. Associated inflammatory stranding within the adjacent right submandibular space and submental region, extending into the right parapharyngeal space. Note made of mild dilatation of the intra glandular ducts within the left submandibular gland as well without associated inflammation or obstructive stone. Thyroid: Few scattered subcentimeter hypodense thyroid nodules noted, largest of which measures 8 mm. These do  not meet imaging criteria for further evaluation with thyroid ultrasound. Lymph nodes: Mildly prominent right level II lymph nodes measure up to 1 cm in short axis, likely reactive. No other pathologically enlarged lymph nodes seen within the neck. Vascular: Normal intravascular enhancement seen throughout the neck. Both carotid artery systems are partially medialized into the retropharyngeal space. Limited intracranial: Unremarkable. Visualized orbits: Visualized globes and orbital soft tissues are within normal limits. Mastoids and visualized paranasal sinuses: Chronic mucoperiosteal thickening seen within the visualized ethmoidal air cells, sphenoid sinuses, and maxillary sinuses. Moderate bilateral mastoid effusions noted, right greater than left. Skeleton: No acute osseous abnormality. No discrete lytic or blastic osseous lesions. Upper chest: Visualized upper chest demonstrates no acute finding. 9 mm soft tissue nodule involving the skin of the left paramedian anterior chest favored to reflect a small sebaceous cyst (series 3, image 101). Other: None. IMPRESSION: 1. 6 mm obstructive stone at the hilum of the right submandibular gland with associated 1.5 x 3.4 x 2.8 cm rim enhancing collection extending medially and inferiorly towards the right sublingual space. Finding could reflect a pseudocyst or abscess (or  infected pseudocyst). Associated inflammatory stranding within the adjacent right submandibular gland and space consistent with associated sialoadenitis and regional cellulitis. While this collection is positioned in a common location for a ranula, it appears to be intimately associated with the submandibular gland itself, with a ranula felt to be less likely. 2. Mildly prominent right level II lymph nodes, likely reactive. 3. Moderate bilateral mastoid effusions, right greater than left. 4. Chronic paranasal sinus disease as above. 5. 9 mm nodular density involving the skin of the left paramedian anterior chest wall, favored to reflect a small sebaceous cyst. Correlation with physical exam recommended. Electronically Signed   By: Jeannine Boga M.D.   On: 12/14/2019 23:30    Labs:  CBC: Recent Labs    07/12/19 0846 12/13/19 1505 12/13/19 2140  WBC 8.9 16.6* 15.2*  HGB 13.2 12.9 12.2  HCT 38.8 36.0 34.5*  PLT 293 284 289    COAGS: No results for input(s): INR, APTT in the last 8760 hours.  BMP: Recent Labs    07/12/19 0846 12/13/19 1505  NA 141 137  K 3.7 3.7  CL 106 100  CO2 26 24  GLUCOSE 101* 109*  BUN 13 15  CALCIUM 9.7 9.5  CREATININE 0.93 1.01*  GFRNONAA >60 >60  GFRAA >60 >60    LIVER FUNCTION TESTS: Recent Labs    07/12/19 0846 12/13/19 1505  BILITOT 0.4 1.3*  AST 21 19  ALT 18 20  ALKPHOS 130* 119  PROT 8.0 7.8  ALBUMIN 4.1 3.9    TUMOR MARKERS: No results for input(s): AFPTM, CEA, CA199, CHROMGRNA in the last 8760 hours.  Assessment and Plan:  58 y/o F admitted by ENT on 12/14 for IV abx therapy secondary to worsening right submandibular sialadenitis. CT soft tissue neck w/contrast was performed on 12/15 which was notable for a 6 mm obstructive stone the hilum of the right submandibular gland with associated 1.5 x 3.4 x 2.8 cm rim enhancing collection extending medially and inferiorly towards the right sublingual space. IR has been asked to  aspirate this collection to further guide treatment - patient history and imaging has reviewed by Dr. Laurence Ferrari today who agrees to proceed with US guided aspiration/decompression tomorrow (12/17).  Patient to be NPO after midnight until post procedure, AM labs ordered, IR will call for patient when ready. Please call with questions or concerns.  Risks and benefits of image guided aspiration was discussed with the patient and/or patient's family including, but not limited to bleeding, infection, damage to adjacent structures or low yield requiring additional tests.  All of the questions were answered and there is agreement to proceed.  Consent signed and in chart.    Thank you for this interesting consult.  I greatly enjoyed meeting BIRDIE CLUNE and look forward to participating in their care.  A copy of this report was sent to the requesting provider on this date.  Electronically Signed: Joaquim Nam, PA-C 12/15/2019, 3:09 PM   I spent a total of 40 Minutes in face to face in clinical consultation, greater than 50% of which was counseling/coordinating care for image guided aspiration of submandibular fluid collection.

## 2019-12-15 NOTE — Progress Notes (Signed)
Subjective/Chief Complaint: Still about the same   Objective: Vital signs in last 24 hours: Temp:  [98.6 F (37 C)-100.1 F (37.8 C)] 98.6 F (37 C) (12/16 0430) Pulse Rate:  [73-84] 73 (12/16 0723) Resp:  [12-16] 16 (12/16 0723) BP: (120-122)/(79-82) 121/82 (12/16 0430) SpO2:  [95 %-97 %] 96 % (12/16 0723) Last BM Date: 12/13/19  Intake/Output from previous day: 12/15 0701 - 12/16 0700 In: 2121.3 [P.O.:720; I.V.:1201.3; IV Piggyback:200] Out: -  Intake/Output this shift: No intake/output data recorded.  the area of swelling seems to be more confined to the submand area now and it is firm in the gland itself. the floor of mouth is still swollen with pus from the duct  Lab Results:  Recent Labs    12/13/19 1505 12/13/19 2140  WBC 16.6* 15.2*  HGB 12.9 12.2  HCT 36.0 34.5*  PLT 284 289   BMET Recent Labs    12/13/19 1505  NA 137  K 3.7  CL 100  CO2 24  GLUCOSE 109*  BUN 15  CREATININE 1.01*  CALCIUM 9.5   PT/INR No results for input(s): LABPROT, INR in the last 72 hours. ABG No results for input(s): PHART, HCO3 in the last 72 hours.  Invalid input(s): PCO2, PO2  Studies/Results: CT SOFT TISSUE NECK W CONTRAST  Result Date: 12/14/2019 CLINICAL DATA:  Initial evaluation for neck pain with swelling to left side of neck. EXAM: CT NECK WITH CONTRAST TECHNIQUE: Multidetector CT imaging of the neck was performed using the standard protocol following the bolus administration of intravenous contrast. CONTRAST:  29mL OMNIPAQUE IOHEXOL 300 MG/ML  SOLN COMPARISON:  None. FINDINGS: Pharynx and larynx: Inflammatory process centered at the right sublingual and submandibular space, described below. Oral cavity otherwise within normal limits. Mild asymmetric enlargement of the right palatine tonsil as compared to the left, suspected to be reactive. Mild induration within the adjacent right parapharyngeal fat related to the inflammatory process within the adjacent right  submandibular space. Associated mild asymmetric fullness of the right oropharynx and nasopharynx without discrete mass. No retropharyngeal collection. No overt evidence for acute epiglottitis. Vallecula effaced. Remainder of the hypopharynx and supraglottic larynx within normal limits. Glottis within normal limits. Subglottic airway clear. Salivary glands: Parotid glands are within normal limits bilaterally. 7 mm enhancing nodule at the posterior right parotid gland felt to be most consistent with a small intraparotid lymph node (series 3, image 27). Right submandibular gland asymmetrically enlarged and edematous in appearance, suggesting acute solid adenitis. There is a 6 mm obstructive stone at the hilum of the right submandibular gland (series 3, image 43). Dilatation of the intraglandular ducts seen proximally. Distally, there is a well-circumscribed hypodense rim enhancing collection extending from the hilum of the right submandibular gland to extend medially and inferiorly towards the right sublingual space measuring approximately 1.5 x 3.4 x 2.8 cm (series 3, image 44). Finding could reflect a pseudocyst or abscess (or infected pseudocyst). Lesion appears intimately associated with the mylohyoid muscle. Associated inflammatory stranding within the adjacent right submandibular space and submental region, extending into the right parapharyngeal space. Note made of mild dilatation of the intra glandular ducts within the left submandibular gland as well without associated inflammation or obstructive stone. Thyroid: Few scattered subcentimeter hypodense thyroid nodules noted, largest of which measures 8 mm. These do not meet imaging criteria for further evaluation with thyroid ultrasound. Lymph nodes: Mildly prominent right level II lymph nodes measure up to 1 cm in short axis, likely reactive. No other  pathologically enlarged lymph nodes seen within the neck. Vascular: Normal intravascular enhancement seen  throughout the neck. Both carotid artery systems are partially medialized into the retropharyngeal space. Limited intracranial: Unremarkable. Visualized orbits: Visualized globes and orbital soft tissues are within normal limits. Mastoids and visualized paranasal sinuses: Chronic mucoperiosteal thickening seen within the visualized ethmoidal air cells, sphenoid sinuses, and maxillary sinuses. Moderate bilateral mastoid effusions noted, right greater than left. Skeleton: No acute osseous abnormality. No discrete lytic or blastic osseous lesions. Upper chest: Visualized upper chest demonstrates no acute finding. 9 mm soft tissue nodule involving the skin of the left paramedian anterior chest favored to reflect a small sebaceous cyst (series 3, image 101). Other: None. IMPRESSION: 1. 6 mm obstructive stone at the hilum of the right submandibular gland with associated 1.5 x 3.4 x 2.8 cm rim enhancing collection extending medially and inferiorly towards the right sublingual space. Finding could reflect a pseudocyst or abscess (or infected pseudocyst). Associated inflammatory stranding within the adjacent right submandibular gland and space consistent with associated sialoadenitis and regional cellulitis. While this collection is positioned in a common location for a ranula, it appears to be intimately associated with the submandibular gland itself, with a ranula felt to be less likely. 2. Mildly prominent right level II lymph nodes, likely reactive. 3. Moderate bilateral mastoid effusions, right greater than left. 4. Chronic paranasal sinus disease as above. 5. 9 mm nodular density involving the skin of the left paramedian anterior chest wall, favored to reflect a small sebaceous cyst. Correlation with physical exam recommended. Electronically Signed   By: Jeannine Boga M.D.   On: 12/14/2019 23:30    Anti-infectives: Anti-infectives (From admission, onward)   Start     Dose/Rate Route Frequency Ordered Stop    12/13/19 1800  Ampicillin-Sulbactam (UNASYN) 3 g in sodium chloride 0.9 % 100 mL IVPB     3 g 200 mL/hr over 30 Minutes Intravenous Every 6 hours 12/13/19 1750        Assessment/Plan: s/p * No surgery found * we discussed the Ct scan with a collection of fluid which could be salivia or pus. wwe discussed I/D vs [place] FNA. also could just continue the current treatment. she called her family and they decided to go with FNA first.   LOS: 1 day    Melissa Montane 12/15/2019

## 2019-12-16 ENCOUNTER — Inpatient Hospital Stay (HOSPITAL_COMMUNITY): Payer: BC Managed Care – PPO

## 2019-12-16 LAB — CBC
HCT: 28.3 % — ABNORMAL LOW (ref 36.0–46.0)
Hemoglobin: 10 g/dL — ABNORMAL LOW (ref 12.0–15.0)
MCH: 27.1 pg (ref 26.0–34.0)
MCHC: 35.3 g/dL (ref 30.0–36.0)
MCV: 76.7 fL — ABNORMAL LOW (ref 80.0–100.0)
Platelets: 286 10*3/uL (ref 150–400)
RBC: 3.69 MIL/uL — ABNORMAL LOW (ref 3.87–5.11)
RDW: 12.9 % (ref 11.5–15.5)
WBC: 11.1 10*3/uL — ABNORMAL HIGH (ref 4.0–10.5)
nRBC: 0 % (ref 0.0–0.2)

## 2019-12-16 LAB — SURGICAL PCR SCREEN
MRSA, PCR: NEGATIVE
Staphylococcus aureus: NEGATIVE

## 2019-12-16 LAB — PROTIME-INR
INR: 1.2 (ref 0.8–1.2)
Prothrombin Time: 14.7 seconds (ref 11.4–15.2)

## 2019-12-16 MED ORDER — FENTANYL CITRATE (PF) 100 MCG/2ML IJ SOLN
INTRAMUSCULAR | Status: AC | PRN
  Administered 2019-12-16: 12.5 ug via INTRAVENOUS
  Administered 2019-12-16: 50 ug via INTRAVENOUS

## 2019-12-16 MED ORDER — FENTANYL CITRATE (PF) 100 MCG/2ML IJ SOLN
INTRAMUSCULAR | Status: AC
  Filled 2019-12-16: qty 2

## 2019-12-16 MED ORDER — MIDAZOLAM HCL 2 MG/2ML IJ SOLN
INTRAMUSCULAR | Status: AC
  Filled 2019-12-16: qty 2

## 2019-12-16 MED ORDER — LIDOCAINE HCL (PF) 1 % IJ SOLN
INTRAMUSCULAR | Status: AC
  Filled 2019-12-16: qty 30

## 2019-12-16 MED ORDER — MIDAZOLAM HCL 2 MG/2ML IJ SOLN
INTRAMUSCULAR | Status: AC | PRN
  Administered 2019-12-16: 1 mg via INTRAVENOUS
  Administered 2019-12-16: 0.5 mg via INTRAVENOUS

## 2019-12-16 NOTE — Procedures (Signed)
Interventional Radiology Procedure Note  Procedure: US guided aspiration of right submandibular abscess  Complications: None  Estimated Blood Loss: < 10 mL  Findings: Able to approach right submandibular abscess from right sublingual approach under US guidance. Calculus visible by [place]. 5 Fr Yueh catheter advanced, yielding 7 mL of purulent, thick liquid. Sent for culture analysis.  Venetia Night. Kathlene Cote, M.D Pager:  [phone]

## 2019-12-16 NOTE — Progress Notes (Signed)
   Subjective:    Patient ID: Cynthia Ford, female    DOB: 1961/01/04, 58 y.o.   MRN: [id number]  HPI Some more soreness in the right neck today, extending back toward the ear more.  Review of Systems     Objective:   Physical Exam Tm 101.5 VSS Alert, NAD Floor of mouth edema more to the right Right submandibular and submental space firm edema, tender    Assessment & Plan:  Right submandibular sialadenitis, sialolithiasis, abscess  Plan is for IR to perform ultrasound-guided drainage today.  Continue IV antibiotics.  Surgical management may still be needed.

## 2019-12-16 NOTE — Progress Notes (Signed)
Pharmacy Antibiotic Note  Cynthia Ford is a 58 y.o. female admitted on 12/13/2019 with Salivary gland infection (sent by ENT). .  Pharmacy has been consulted for Unasyn dosing. Right submandibular sialadenitis, sialolithiasis, abscess. WBC 16>15.2 >11.1 trending down,  Afebrile, Tm 101.5 SCr is up at 1.01 with estimated CrCl ~ 60.9 mL/min.    ENT noted plan is for IR to perform ultrasound-guided drainage 12/17.  Continuing IV antibiotics   Plan:  Continue Unasyn 3g IV every 6 hours.  Monitor renal function and adjust dosing as needed.   Height: 5\' 4"  (162.6 cm) Weight: 165 lb 2 oz (74.9 kg) IBW/kg (Calculated) : 54.7  Temp (24hrs), Avg:99.4 F (37.4 C), Min:98.6 F (37 C), Max:101.5 F (38.6 C)  Recent Labs  Lab 12/13/19 1505 12/13/19 2140 12/16/19 0149  WBC 16.6* 15.2* 11.1*  CREATININE 1.01*  --   --   LATICACIDVEN 1.2  --   --     Estimated Creatinine Clearance: 60.9 mL/min (A) (by C-G formula based on SCr of 1.01 mg/dL (H)).    No Known Allergies  Antimicrobials this admission: Unasyn 12/14 >>  Dose adjustments this admission:   Microbiology results: 2/16 surgical PCR: neg 12/14 COVID: neg   Thank you for allowing pharmacy to be a part of this patient's care.  Nicole Cella, RPh Clinical Pharmacist Please refer to [facility] for [place] numbers 12/16/2019 11:31 AM

## 2019-12-17 MED ORDER — CLINDAMYCIN HCL 300 MG PO CAPS: 300.0000 mg | ORAL_CAPSULE | Freq: Three times a day (TID) | ORAL | Status: DC

## 2019-12-17 MED ORDER — CLINDAMYCIN PHOSPHATE 600 MG/50ML IV SOLN
600.0000 mg | Freq: Three times a day (TID) | INTRAVENOUS | Status: DC
  Administered 2019-12-17 – 2019-12-19 (×6): 600 mg via INTRAVENOUS
  Filled 2019-12-17 (×6): qty 50

## 2019-12-17 NOTE — Progress Notes (Signed)
Subjective/Chief Complaint: Still having pain but swelling down and she is taking some fluids   Objective: Vital signs in last 24 hours: Temp:  [98.7 F (37.1 C)-100.2 F (37.9 C)] 98.7 F (37.1 C) (12/18 0442) Pulse Rate:  [66-85] 79 (12/18 0442) Resp:  [10-18] 14 (12/18 0442) BP: (122-148)/(80-97) 141/89 (12/18 0442) SpO2:  [92 %-100 %] 98 % (12/18 0757) Last BM Date: 12/14/19  Intake/Output from previous day: 12/17 0701 - 12/18 0700 In: 480 [P.O.:480] Out: -  Intake/Output this shift: No intake/output data recorded.  alert. decreased swelling in neck and the FOM. she has better opening.   Lab Results:  Recent Labs    12/16/19 0149  WBC 11.1*  HGB 10.0*  HCT 28.3*  PLT 286   BMET No results for input(s): NA, K, CL, CO2, GLUCOSE, BUN, CREATININE, CALCIUM in the last 72 hours. PT/INR Recent Labs    12/16/19 0149  LABPROT 14.7  INR 1.2   ABG No results for input(s): PHART, HCO3 in the last 72 hours.  Invalid input(s): PCO2, PO2  Studies/Results: US Guided Needle Placement  Result Date: 12/16/2019 INDICATION: Right submandibular abscess with obstructive submandibular gland calculus. EXAM: ULTRASOUND GUIDED ASPIRATION OF RIGHT SUBMANDIBULAR ABSCESS MEDICATIONS: The patient is currently admitted to the hospital and receiving intravenous antibiotics. The antibiotics were administered within an appropriate time frame prior to the initiation of the procedure. ANESTHESIA/SEDATION: Fentanyl 62.5 mcg IV; Versed 1.5 mg IV Moderate Sedation Time:  20 minutes. The patient was continuously monitored during the procedure by the interventional radiology nurse under my direct supervision. COMPLICATIONS: None immediate. PROCEDURE: Informed written consent was obtained from the patient after a thorough discussion of the procedural risks, benefits and alternatives. All questions were addressed. Maximal Sterile Barrier Technique was utilized including caps, mask, sterile gowns,  sterile gloves, sterile drape, hand hygiene and skin antiseptic. A timeout was performed prior to the initiation of the procedure. Ultrasound was performed in the right submandibular/sublingual region. After localizing a focal abscess/complex fluid collection, 1% lidocaine was infiltrated for local anesthesia. A 5 [place] Yueh centesis catheter was then inserted into the collection. Aspiration was performed and a fluid sample sent for culture analysis. Additional lavage was performed with 3 mL of sterile saline. The Yueh catheter was removed. Additional ultrasound was performed. FINDINGS: Irregular and complex fluid collection identified in the right submandibular space measuring up to approximately 4.9 cm in maximal diameter by ultrasound. Along the more posterior aspect of the collection, a focal shadowing calculus is identified measuring approximately 7 mm in diameter. Aspiration of the collection yielded grossly purulent fluid with 7 mL of fluid able to be initially aspirated resulting in significant decompression. Additional sterile saline lavage resulted in further decompression. IMPRESSION: Aspiration of right submandibular complex abscess yielding 7 mL of purulent fluid. The entire fluid sample was sent for culture analysis. Aspiration as well as some additional saline lavage resulted in significant decompression of the abscess. The submandibular gland calculus is visible by ultrasound as a 7 mm shadowing structure along the posterior aspect of the abscess. Electronically Signed   By: Aletta Edouard M.D.   On: 12/16/2019 15:03    Anti-infectives: Anti-infectives (From admission, onward)   Start     Dose/Rate Route Frequency Ordered Stop   12/13/19 1800  Ampicillin-Sulbactam (UNASYN) 3 g in sodium chloride 0.9 % 100 mL IVPB     3 g 200 mL/hr over 30 Minutes Intravenous Every 6 hours 12/13/19 1750  Assessment/Plan: s/p * No surgery found * she underwent a Needle drainage of 7 cc she is  better but still with alot of pain. she does not want to go home yet but I will change to clindamycin and plan on tomorrow am if continued improving  LOS: 3 days    Melissa Montane 12/17/2019

## 2019-12-18 LAB — BASIC METABOLIC PANEL
Anion gap: 10 (ref 5–15)
BUN: <5 mg/dL — ABNORMAL LOW (ref 6–20)
CO2: 28 mmol/L (ref 22–32)
Calcium: 8.9 mg/dL (ref 8.9–10.3)
Chloride: 100 mmol/L (ref 98–111)
Creatinine, Ser: 0.9 mg/dL (ref 0.44–1.00)
GFR calc Af Amer: >60 mL/min (ref >60)
GFR calc non Af Amer: >60 mL/min (ref >60)
Glucose, Bld: 104 mg/dL — ABNORMAL HIGH (ref 70–99)
Potassium: 2.9 mmol/L — ABNORMAL LOW (ref 3.5–5.1)
Sodium: 138 mmol/L (ref 135–145)

## 2019-12-18 NOTE — Progress Notes (Signed)
Subjective/Chief Complaint: She is still with pain and some swallowing   Objective: Vital signs in last 24 hours: Temp:  [98.1 F (36.7 C)-99.3 F (37.4 C)] 98.1 F (36.7 C) (12/19 0409) Pulse Rate:  [68-71] 68 (12/19 0409) Resp:  [16-18] 18 (12/19 0409) BP: (139-147)/(84-88) 141/88 (12/19 0409) SpO2:  [99 %] 99 % (12/19 0748) Last BM Date: 12/14/19  Intake/Output from previous day: 12/18 0701 - 12/19 0700 In: 3906.4 [P.O.:1080; I.V.:2614.1; IV Piggyback:212.3] Out: -  Intake/Output this shift: No intake/output data recorded.  she has significant decrease in swelling in FOM and no longer pus. the neck has isolated swelling to the submand area and still firm. no cellulitis.   Lab Results:  Recent Labs    12/16/19 0149  WBC 11.1*  HGB 10.0*  HCT 28.3*  PLT 286   BMET Recent Labs    12/18/19 0418  NA 138  K 2.9*  CL 100  CO2 28  GLUCOSE 104*  BUN <5*  CREATININE 0.90  CALCIUM 8.9   PT/INR Recent Labs    12/16/19 0149  LABPROT 14.7  INR 1.2   ABG No results for input(s): PHART, HCO3 in the last 72 hours.  Invalid input(s): PCO2, PO2  Studies/Results: US Guided Needle Placement  Result Date: 12/16/2019 INDICATION: Right submandibular abscess with obstructive submandibular gland calculus. EXAM: ULTRASOUND GUIDED ASPIRATION OF RIGHT SUBMANDIBULAR ABSCESS MEDICATIONS: The patient is currently admitted to the hospital and receiving intravenous antibiotics. The antibiotics were administered within an appropriate time frame prior to the initiation of the procedure. ANESTHESIA/SEDATION: Fentanyl 62.5 mcg IV; Versed 1.5 mg IV Moderate Sedation Time:  20 minutes. The patient was continuously monitored during the procedure by the interventional radiology nurse under my direct supervision. COMPLICATIONS: None immediate. PROCEDURE: Informed written consent was obtained from the patient after a thorough discussion of the procedural risks, benefits and alternatives.  All questions were addressed. Maximal Sterile Barrier Technique was utilized including caps, mask, sterile gowns, sterile gloves, sterile drape, hand hygiene and skin antiseptic. A timeout was performed prior to the initiation of the procedure. Ultrasound was performed in the right submandibular/sublingual region. After localizing a focal abscess/complex fluid collection, 1% lidocaine was infiltrated for local anesthesia. A 5 [place] Yueh centesis catheter was then inserted into the collection. Aspiration was performed and a fluid sample sent for culture analysis. Additional lavage was performed with 3 mL of sterile saline. The Yueh catheter was removed. Additional ultrasound was performed. FINDINGS: Irregular and complex fluid collection identified in the right submandibular space measuring up to approximately 4.9 cm in maximal diameter by ultrasound. Along the more posterior aspect of the collection, a focal shadowing calculus is identified measuring approximately 7 mm in diameter. Aspiration of the collection yielded grossly purulent fluid with 7 mL of fluid able to be initially aspirated resulting in significant decompression. Additional sterile saline lavage resulted in further decompression. IMPRESSION: Aspiration of right submandibular complex abscess yielding 7 mL of purulent fluid. The entire fluid sample was sent for culture analysis. Aspiration as well as some additional saline lavage resulted in significant decompression of the abscess. The submandibular gland calculus is visible by ultrasound as a 7 mm shadowing structure along the posterior aspect of the abscess. Electronically Signed   By: Aletta Edouard M.D.   On: 12/16/2019 15:03    Anti-infectives: Anti-infectives (From admission, onward)   Start     Dose/Rate Route Frequency Ordered Stop   12/17/19 1700  clindamycin (CLEOCIN) IVPB 600 mg  600 mg 100 mL/hr over 30 Minutes Intravenous Every 8 hours 12/17/19 1621     12/17/19 1600   clindamycin (CLEOCIN) capsule 300 mg  Status:  Discontinued     300 mg Oral 3 times daily 12/17/19 1556 12/17/19 1621   12/13/19 1800  Ampicillin-Sulbactam (UNASYN) 3 g in sodium chloride 0.9 % 100 mL IVPB  Status:  Discontinued     3 g 200 mL/hr over 30 Minutes Intravenous Every 6 hours 12/13/19 1750 12/17/19 1621      Assessment/Plan: s/p * No surgery found * she still does not want to go home. will check culture and continue clindamycin IV until tomorrow  LOS: 4 days    Melissa Montane 12/18/2019

## 2019-12-19 MED ORDER — HYDROCODONE-ACETAMINOPHEN 5-325 MG PO TABS: 1.0000 | ORAL_TABLET | ORAL | 0 refills | Status: DC | PRN

## 2019-12-19 MED ORDER — CLINDAMYCIN HCL 300 MG PO CAPS: 300.0000 mg | ORAL_CAPSULE | Freq: Three times a day (TID) | ORAL | 0 refills | Status: AC

## 2019-12-19 NOTE — Discharge Summary (Signed)
Physician Discharge Summary  Patient ID: Cynthia Ford MRN: [id number] DOB/AGE: [birth year] 58 y.o.  Admit date: 12/13/2019 Discharge date: 12/19/2019  Admission Diagnoses: Sialadenitis  Discharge Diagnoses: Same Active Problems:   Sialadenitis   Discharged Condition: good  Hospital Course: Admitted for significant swelling and swallowing difficulty secondary to submandibular sialadenitis and extensive cellulitis.  She been treated with Unasyn and then changed to clindamycin.  She has decreased the swelling significantly.  Today she is able to swallow and her voice is back to normal.  She is able to open her mouth well.  She is now ready to be discharged.  Consults: None  Significant Diagnostic Studies: Ultrasound fine-needle aspiration  Treatments: 0  Discharge Exam: Blood pressure 128/88, pulse (!) 57, temperature 98.3 F (36.8 C), temperature source Oral, resp. rate 18, height 5\' 4"  (1.626 m), weight 74.9 kg, SpO2 99 %. Awake and alert.  Swelling in the floor mouth has substantially decreased.  Wharton's duct has settled down no further purulence.  The submandibular area is also decreased in swelling to the point now it is confined to the submandibular triangle.  Heart is regular.  Lungs normal effort.  No tenderness or swelling of the extremities.  Disposition: Patient is to be discharged on clindamycin.  Will follow up in 1 week sooner if anything is worse.  Also pain medicine will be given as she requests.  Culture is still pending with a few organisms and it would appear clindamycin should be appropriate     Signed: Melissa Montane 12/19/2019, 9:23 AM

## 2019-12-21 LAB — AEROBIC/ANAEROBIC CULTURE W GRAM STAIN (SURGICAL/DEEP WOUND): Special Requests: NORMAL

## 2020-01-19 ENCOUNTER — Ambulatory Visit
Admission: RE | Admit: 2020-01-19 | Discharge: 2020-01-19 | Disposition: A | Discharge status: Home / Self Care | Payer: 59 | Source: Ambulatory Visit | Attending: Hematology | Admitting: Hematology

## 2020-01-19 ENCOUNTER — Other Ambulatory Visit: Payer: Self-pay

## 2020-01-19 ENCOUNTER — Ambulatory Visit
Admission: RE | Admit: 2020-01-19 | Discharge: 2020-01-19 | Disposition: A | Discharge status: Home / Self Care | Payer: BC Managed Care – PPO | Source: Ambulatory Visit | Attending: Hematology | Admitting: Hematology

## 2020-01-19 ENCOUNTER — Other Ambulatory Visit: Payer: Self-pay | Admitting: Hematology

## 2020-01-19 DIAGNOSIS — E2839 Other primary ovarian failure: Secondary | ICD-10-CM

## 2020-01-19 DIAGNOSIS — D0512 Intraductal carcinoma in situ of left breast: Secondary | ICD-10-CM

## 2020-01-19 DIAGNOSIS — R921 Mammographic calcification found on diagnostic imaging of breast: Secondary | ICD-10-CM

## 2020-03-14 ENCOUNTER — Other Ambulatory Visit: Payer: Self-pay | Admitting: Hematology

## 2020-03-14 DIAGNOSIS — C50512 Malignant neoplasm of lower-outer quadrant of left female breast: Secondary | ICD-10-CM

## 2020-03-14 DIAGNOSIS — Z170 Estrogen receptor positive status [ER+]: Secondary | ICD-10-CM

## 2020-07-06 NOTE — Progress Notes (Signed)
[facility]   Telephone:(336) [phone] Fax:(336) [phone]   Clinic Follow up Note   Patient Care Team: [name], MD as PCP - General (Family Medicine)  Date of Service:  07/10/2020  CHIEF COMPLAINT: Follow up left breast DCIS  SUMMARY OF ONCOLOGIC HISTORY: Oncology History Overview Note  Breast cancer of lower-outer quadrant of left female breast   Staging form: Breast, AJCC 7th Edition     Clinical: Stage 0 (Tis (DCIS), N0, M0) - Unsigned     Ductal carcinoma in situ (DCIS) of left breast  12/19/2014 Pathology Results   Ductal papilloma   12/19/2014 Mammogram   2 new solid masses (35mm, 8mm) in the 6:00 position of the left breast.   02/14/2015 Initial Diagnosis   Breast cancer of lower-outer quadrant of left female breast   02/14/2015 Surgery   Left breast lumpectomy, negative margins.    02/14/2015 Pathologic Stage   Low-grade ductal carcinoma in situ involving intraductal papilloma, tumor 2 mm, ER 100%, PR 100% positive.   04/10/2015 - 05/26/2015 Radiation Therapy   adjuvant breast radiation    06/30/2015 - 05/2020 Anti-estrogen oral therapy   anastrozole 1mg  daily started on 06/30/2015. Due to right hip pain, Anastrozole was held 06/2019-08/2019. Stopped again in 05/2020 due to worsened arthritis.    12/26/2015 Imaging   MM DIAG BREAST TOMO BILATERAL 12/26/2015 IMPRESSION: No evidence of malignancy. Benign postsurgical scarring on the left. BI-RADS CATEGORY  2: Benign.   12/26/2016 Imaging   MM DIAG BREAST TOMO BILATERAL 12/26/2016 IMPRESSION: No findings worrisome for recurrent tumor or developing malignancy. Stable breast parenchymal pattern. BI-RADS CATEGORY  1: Negative.   01/01/2018 Mammogram   MM DIAG BREAST TOMO BILATERAL: IMPRESSION: 1. Possible subtle architectural distortion within the lower right breast, lower outer quadrant based on tomosynthesis slice position, best seen on MLO slice 61, less conspicuously seen on spot compression MLO and  true lateral images, without sonographic correlate. Stereotactic biopsy with 3D tomosynthesis guidance is recommended. 2. Benign right-sided subareolar and periareolar duct ectasia, the most likely source for patient's intermittent clear nipple discharge. 3. No evidence of malignancy within the left breast. Stable postsurgical changes within the left breast.   01/07/2018 Pathology Results   Diagnosis 01/07/2018 Breast, right, needle core biopsy, lower central - COMPLEX SCLEROSING LESION WITH USUAL DUCTAL HYPERPLASIA - PERIDUCTULAR CHRONIC INFLAMMATION - SEE COMMENT Microscopic Comment These results were called to [facility] on January 08, 2018.   02/17/2018 Surgery   BREAST LUMPECTOMY WITH RADIOACTIVE SEED LOCALIZATION by Dr. Brantley Stage 02/17/18    02/17/2018 Pathology Results   Diagnosis 02/17/18  Breast, lumpectomy, Right - RADIAL SCAR. - FIBROCYSTIC CHANGES. - HEALING BIOPSY SITE. - THERE IS NO EVIDENCE OF MALIGNANCY. - SEE COMMENT. Microscopic Comment The surgical resection margin(s) of the specimen were inked and microscopically evaluated.          CURRENT THERAPY:  Surveillance   INTERVAL HISTORY:  Cynthia Ford is here for a follow up of left breast DCIS. She was last seen by me 1 year ago. She presents to the clinic alone. She notes she is doing well. She notes she did restart anastrozole but stopped again. She notes she took last pill in 05/2020. She notes she still has hip pain, mostly from arthritis. The hip tear she has is not large enough to cause her the issues she has. She has surgery on her right salivary gland. She has some facial pain at times, but more manageable. She notes  she has not had appetite lately.    REVIEW OF SYSTEMS:   Constitutional: Denies fevers, chills or abnormal weight loss Eyes: Denies blurriness of vision Ears, nose, mouth, throat, and face: Denies mucositis or sore throat Respiratory: Denies cough, dyspnea or  wheezes Cardiovascular: Denies palpitation, chest discomfort or lower extremity swelling Gastrointestinal:  Denies nausea, heartburn or change in bowel habits Skin: Denies abnormal skin rashes MSK: (+) hip pain (+) Facial pain from surgery Lymphatics: Denies new lymphadenopathy or easy bruising Neurological:Denies numbness, tingling or new weaknesses Behavioral/Psych: Mood is stable, no new changes  All other systems were reviewed with the patient and are negative.  MEDICAL HISTORY:  Past Medical History:  Diagnosis Date  . Allergy   . Anemia   . Anxiety   . Asthma   . Breast cancer ([place]) 2016   Left Breast Cancer  . GERD (gastroesophageal reflux disease)   . Papilloma of left breast   . Personal history of radiation therapy   . PONV (postoperative nausea and vomiting)   . Salivary gland infection 11/2019    SURGICAL HISTORY: Past Surgical History:  Procedure Laterality Date  . BREAST EXCISIONAL BIOPSY Right 02/17/2018  . BREAST EXCISIONAL BIOPSY Left   . BREAST LUMPECTOMY Left 2016  . BREAST LUMPECTOMY WITH RADIOACTIVE SEED LOCALIZATION Left 02/14/2015   Procedure: BREAST LUMPECTOMY WITH RADIOACTIVE SEED LOCALIZATION;  Surgeon: Erroll Luna, MD;  Location: [facility];  Service: General;  Laterality: Left;  . BREAST LUMPECTOMY WITH RADIOACTIVE SEED LOCALIZATION Right 02/17/2018   Procedure: BREAST LUMPECTOMY WITH RADIOACTIVE SEED LOCALIZATION;  Surgeon: Erroll Luna, MD;  Location: [place];  Service: General;  Laterality: Right;  . BREAST SURGERY  2010   LEFT  . CHOLECYSTECTOMY  2001  . KNEE ARTHROSCOPY Left 1999/1993  . NASAL PASSAGE  2000  . NASAL SEPTUM SURGERY    . TYMPANOSTOMY TUBE PLACEMENT  12/2018    I have reviewed the social history and family history with the patient and they are unchanged from previous note.  ALLERGIES:  has No Known Allergies.  MEDICATIONS:  Current Outpatient Medications  Medication Sig Dispense  Refill  . albuterol (PROVENTIL HFA;VENTOLIN HFA) 108 (90 BASE) MCG/ACT inhaler Inhale into the lungs every 6 (six) hours as needed for wheezing or shortness of breath.    . Ascorbic Acid (VITA-C PO) Take 1 tablet by mouth daily.     Marland Kitchen CALCIUM PO Take 600 mg by mouth daily.     . calcium-vitamin D (OSCAL WITH D) 500-200 MG-UNIT tablet Take 1 tablet by mouth daily.    . cetirizine (ZYRTEC) 10 MG tablet Take 10 mg by mouth daily.      . cholecalciferol (VITAMIN D) 1000 units tablet Take 2,000 Units by mouth daily.    . Fluticasone-Salmeterol (ADVAIR DISKUS) 250-50 MCG/DOSE AEPB Inhale 1 puff into the lungs 2 (two) times daily.     Marland Kitchen HYDROcodone-acetaminophen (NORCO/VICODIN) 5-325 MG tablet Take 1 tablet by mouth every 4 (four) hours as needed for moderate pain. 30 tablet 0  . ibuprofen (ADVIL,MOTRIN) 800 MG tablet Take 1 tablet (800 mg total) by mouth every 8 (eight) hours as needed. 30 tablet 0  . naproxen (NAPROSYN) 500 MG tablet Take 500 mg by mouth 2 (two) times daily as needed for mild pain.     No current facility-administered medications for this visit.    PHYSICAL EXAMINATION: ECOG PERFORMANCE STATUS: 1 - Symptomatic but completely ambulatory  Vitals:   07/10/20 1130  BP: 119/81  Pulse: 77  Resp: 16  Temp: 97.9 F (36.6 C)  SpO2: 100%   Filed Weights   07/10/20 1130  Weight: 156 lb 12.8 oz (71.1 kg)    GENERAL:alert, no distress and comfortable SKIN: skin color, texture, turgor are normal, no rashes or significant lesions EYES: normal, Conjunctiva are pink and non-injected, sclera clear  NECK: supple, thyroid normal size, non-tender, without nodularity LYMPH:  no palpable lymphadenopathy in the cervical, axillary  LUNGS: clear to auscultation and percussion with normal breathing effort HEART: regular rate & rhythm and no murmurs and no lower extremity edema ABDOMEN:abdomen soft, non-tender and normal bowel sounds Musculoskeletal:no cyanosis of digits and no clubbing   NEURO: alert & oriented x 3 with fluent speech, no focal motor/sensory deficits BREAST: S/p left lumpectomy: Surgical incision healed well. No palpable mass, nodules or adenopathy bilaterally. Breast exam benign.   LABORATORY DATA:  I have reviewed the data as listed CBC Latest Ref Rng & Units 07/10/2020 12/16/2019 12/13/2019  WBC 4.0 - 10.5 K/uL 8.6 11.1(H) 15.2(H)  Hemoglobin 12.0 - 15.0 g/dL 13.1 10.0(L) 12.2  Hematocrit 36 - 46 % 37.4 28.3(L) 34.5(L)  Platelets 150 - 400 K/uL 298 286 289     CMP Latest Ref Rng & Units 07/10/2020 12/18/2019 12/13/2019  Glucose 70 - 99 mg/dL 99 104(H) 109(H)  BUN 6 - 20 mg/dL 9 <5(L) 15  Creatinine 0.44 - 1.00 mg/dL 0.96 0.90 1.01(H)  Sodium 135 - 145 mmol/L 142 138 137  Potassium 3.5 - 5.1 mmol/L 4.1 2.9(L) 3.7  Chloride 98 - 111 mmol/L 106 100 100  CO2 22 - 32 mmol/L 27 28 24   Calcium 8.9 - 10.3 mg/dL 9.7 8.9 9.5  Total Protein 6.5 - 8.1 g/dL 7.8 - 7.8  Total Bilirubin 0.3 - 1.2 mg/dL 0.4 - 1.3(H)  Alkaline Phos 38 - 126 U/L 116 - 119  AST 15 - 41 U/L 16 - 19  ALT 0 - 44 U/L 17 - 20      RADIOGRAPHIC STUDIES: I have personally reviewed the radiological images as listed and agreed with the findings in the report. No results found.   ASSESSMENT & PLAN:  Cynthia Ford is a 59 y.o. female with    1. DCIS of left breast, ER+/PR+ -She was diagnosed in 01/2015. She is s/p left breast lumpectomy and adjuvant radiation.  -She is on anastrozole for breast cancer preventionsince 06/2015, plan for a total 5 years. She stopped in 05/2020 due to worsened arthritis. She almost completed planned 5 years therapy, she is fine to stop completely.  -She is clinically doing well. Lab reviewed, her CBC and CMP are within normal limits except MCV 77.6. Her physical exam and her 12/2019 mammogram were unremarkable. There is no clinical concern for recurrence. -Continue surveillance. Next mammogram in 12/2020. I discussed the option of additional screening with  breast MRIs every 1-2 years. I also discussed Abbreviated MRI which has $400 self-pay cost. She is interested, plan for MRI in 1 month  -F/u in 1 year.   2. Right breast complex sclerosing lesion, resected -1/3/19mammogram showed subtle architectural distortion within the right breast, biopsy showed complex sclerosing lesion, no evidence of malignancy. -01/07/18 biopsy showedcomplex sclerosinglesion with ductal hyperplasia -She had a right breastlumpectomyby Dr. Brantley Stage on 02/17/18 which showed radial scar, there was no evidence of malignancy.  3. Genetic, Testing negative except unknown significance variant in [facility] 1 gene.   4. Bone health -Her bone density scan in August 2016 was normal -01/01/18  DEXA shows normal with lowest T-Score at forearm radius of -0.7.  -Continuecalcium and vitamin Ddaily. -We previously discussed anastrozole may weaken her bone. Next DEXA in 12/2019.  5. Right hip stiffness and pain -exacerbated by AI.  -Her PCP ordered an 2020 MRI of her right hip which found a small tear to the cartilage in her hip. She will f/u with orthopedic surgeon   6. Vitamin D deficiency -She was found to have low vitamin D, treated with high-dose vitamin D by her primary care physician -She is currently on vitamin D 1000 units 1-2 tabs daily.   7. Hearing Loss, R>L  -this is due to fluid collection in her ear.  -Continue to f/u with ENT. She has hearing aids now.   8.RBCmicrocytosis  -She has persistent low MCV in the mid 70s, no anemia -MCV 77.6 today (07/10/20). Stable.    Plan -MRI breast in 1 month, will submit to her insurance to get approval  -Mammogram in 12/2020 -Lab and F/u in 1 year    No problem-specific Assessment & Plan notes found for this encounter.   Orders Placed This Encounter  Procedures  . MR BREAST BILATERAL W WO CONTRAST INC CAD    Standing Status:   Future    Standing Expiration Date:   07/10/2021    Order Specific Question:   If  indicated for the ordered procedure, I authorize the administration of contrast media per Radiology protocol    Answer:   Yes    Order Specific Question:   What is the patient's sedation requirement?    Answer:   No Sedation    Order Specific Question:   Does the patient have a pacemaker or implanted devices?    Answer:   No    Order Specific Question:   Radiology Contrast Protocol - do NOT remove file path    Answer:   [email or web address]    Order Specific Question:   Preferred imaging location?    Answer:   [facility] (table limit-550lbs)  . MM Digital Screening    Standing Status:   Future    Standing Expiration Date:   07/10/2021    Order Specific Question:   Reason for Exam (SYMPTOM  OR DIAGNOSIS REQUIRED)    Answer:   screening    Order Specific Question:   Is the patient pregnant?    Answer:   No    Order Specific Question:   Preferred imaging location?    Answer:   [facility]   All questions were answered. The patient knows to call the clinic with any problems, questions or concerns. No barriers to learning was detected. The total time spent in the appointment was 25 minutes.     Truitt Merle, MD 07/10/2020   I, Joslyn Devon, am acting as scribe for Truitt Merle, MD.   I have reviewed the above documentation for accuracy and completeness, and I agree with the above.

## 2020-07-10 ENCOUNTER — Inpatient Hospital Stay (HOSPITAL_BASED_OUTPATIENT_CLINIC_OR_DEPARTMENT_OTHER): Payer: 59 | Admitting: Hematology

## 2020-07-10 ENCOUNTER — Other Ambulatory Visit: Payer: Self-pay

## 2020-07-10 ENCOUNTER — Inpatient Hospital Stay: Payer: 59 | Attending: Hematology

## 2020-07-10 VITALS — BP 119/81 | HR 77 | Temp 97.9°F | Resp 16 | Wt 156.8 lb

## 2020-07-10 DIAGNOSIS — E559 Vitamin D deficiency, unspecified: Secondary | ICD-10-CM | POA: Insufficient documentation

## 2020-07-10 DIAGNOSIS — Z79811 Long term (current) use of aromatase inhibitors: Secondary | ICD-10-CM | POA: Diagnosis not present

## 2020-07-10 DIAGNOSIS — Z1231 Encounter for screening mammogram for malignant neoplasm of breast: Secondary | ICD-10-CM

## 2020-07-10 DIAGNOSIS — J45909 Unspecified asthma, uncomplicated: Secondary | ICD-10-CM | POA: Insufficient documentation

## 2020-07-10 DIAGNOSIS — Z7951 Long term (current) use of inhaled steroids: Secondary | ICD-10-CM | POA: Diagnosis not present

## 2020-07-10 DIAGNOSIS — Z923 Personal history of irradiation: Secondary | ICD-10-CM | POA: Diagnosis not present

## 2020-07-10 DIAGNOSIS — D0512 Intraductal carcinoma in situ of left breast: Secondary | ICD-10-CM | POA: Diagnosis not present

## 2020-07-10 DIAGNOSIS — Z170 Estrogen receptor positive status [ER+]: Secondary | ICD-10-CM | POA: Insufficient documentation

## 2020-07-10 DIAGNOSIS — K219 Gastro-esophageal reflux disease without esophagitis: Secondary | ICD-10-CM | POA: Diagnosis not present

## 2020-07-10 DIAGNOSIS — Z79899 Other long term (current) drug therapy: Secondary | ICD-10-CM | POA: Insufficient documentation

## 2020-07-10 LAB — COMPREHENSIVE METABOLIC PANEL
ALT: 17 U/L (ref 0–44)
AST: 16 U/L (ref 15–41)
Albumin: 3.8 g/dL (ref 3.5–5.0)
Alkaline Phosphatase: 116 U/L (ref 38–126)
Anion gap: 9 (ref 5–15)
BUN: 9 mg/dL (ref 6–20)
CO2: 27 mmol/L (ref 22–32)
Calcium: 9.7 mg/dL (ref 8.9–10.3)
Chloride: 106 mmol/L (ref 98–111)
Creatinine, Ser: 0.96 mg/dL (ref 0.44–1.00)
GFR calc Af Amer: >60 mL/min (ref >60)
GFR calc non Af Amer: >60 mL/min (ref >60)
Glucose, Bld: 99 mg/dL (ref 70–99)
Potassium: 4.1 mmol/L (ref 3.5–5.1)
Sodium: 142 mmol/L (ref 135–145)
Total Bilirubin: 0.4 mg/dL (ref 0.3–1.2)
Total Protein: 7.8 g/dL (ref 6.5–8.1)

## 2020-07-10 LAB — CBC WITH DIFFERENTIAL/PLATELET
Abs Immature Granulocytes: 0.04 10*3/uL (ref 0.00–0.07)
Basophils Absolute: 0.1 10*3/uL (ref 0.0–0.1)
Basophils Relative: 1 %
Eosinophils Absolute: 0.5 10*3/uL (ref 0.0–0.5)
Eosinophils Relative: 6 %
HCT: 37.4 % (ref 36.0–46.0)
Hemoglobin: 13.1 g/dL (ref 12.0–15.0)
Immature Granulocytes: 1 %
Lymphocytes Relative: 21 %
Lymphs Abs: 1.8 10*3/uL (ref 0.7–4.0)
MCH: 27.2 pg (ref 26.0–34.0)
MCHC: 35 g/dL (ref 30.0–36.0)
MCV: 77.6 fL — ABNORMAL LOW (ref 80.0–100.0)
Monocytes Absolute: 0.7 10*3/uL (ref 0.1–1.0)
Monocytes Relative: 8 %
Neutro Abs: 5.6 10*3/uL (ref 1.7–7.7)
Neutrophils Relative %: 63 %
Platelets: 298 10*3/uL (ref 150–400)
RBC: 4.82 MIL/uL (ref 3.87–5.11)
RDW: 13.4 % (ref 11.5–15.5)
WBC: 8.6 10*3/uL (ref 4.0–10.5)
nRBC: 0 % (ref 0.0–0.2)

## 2020-07-11 ENCOUNTER — Telehealth: Payer: Self-pay | Admitting: Hematology

## 2020-07-11 ENCOUNTER — Encounter: Payer: Self-pay | Admitting: Hematology

## 2020-07-11 NOTE — Telephone Encounter (Signed)
Scheduled per 7/12 los. Mailing pt appt calendar.

## 2020-07-19 ENCOUNTER — Other Ambulatory Visit: Payer: Self-pay | Admitting: Hematology

## 2020-07-19 ENCOUNTER — Other Ambulatory Visit: Payer: Self-pay

## 2020-07-19 ENCOUNTER — Ambulatory Visit
Admission: RE | Admit: 2020-07-19 | Discharge: 2020-07-19 | Disposition: A | Discharge status: Home / Self Care | Payer: 59 | Source: Ambulatory Visit | Attending: Hematology | Admitting: Hematology

## 2020-07-19 DIAGNOSIS — R921 Mammographic calcification found on diagnostic imaging of breast: Secondary | ICD-10-CM

## 2020-07-19 DIAGNOSIS — Z853 Personal history of malignant neoplasm of breast: Secondary | ICD-10-CM

## 2020-08-11 ENCOUNTER — Other Ambulatory Visit: Payer: Self-pay

## 2020-08-11 ENCOUNTER — Ambulatory Visit
Admission: RE | Admit: 2020-08-11 | Discharge: 2020-08-11 | Disposition: A | Discharge status: Home / Self Care | Payer: 59 | Source: Ambulatory Visit | Attending: Hematology | Admitting: Hematology

## 2020-08-11 ENCOUNTER — Other Ambulatory Visit: Payer: Self-pay | Admitting: Hematology

## 2020-08-11 DIAGNOSIS — D0512 Intraductal carcinoma in situ of left breast: Secondary | ICD-10-CM

## 2020-08-11 MED ORDER — GADOBUTROL 1 MMOL/ML IV SOLN
6.0000 mL | Freq: Once | INTRAVENOUS | Status: AC | PRN
  Administered 2020-08-11: 6 mL via INTRAVENOUS

## 2020-08-16 ENCOUNTER — Other Ambulatory Visit: Payer: Self-pay | Admitting: Hematology

## 2020-08-16 DIAGNOSIS — D0512 Intraductal carcinoma in situ of left breast: Secondary | ICD-10-CM

## 2020-08-18 ENCOUNTER — Other Ambulatory Visit: Payer: Self-pay

## 2020-08-18 ENCOUNTER — Other Ambulatory Visit: Payer: Self-pay | Admitting: Body Imaging

## 2020-08-18 ENCOUNTER — Ambulatory Visit
Admission: RE | Admit: 2020-08-18 | Discharge: 2020-08-18 | Disposition: A | Discharge status: Home / Self Care | Payer: 59 | Source: Ambulatory Visit | Attending: Hematology | Admitting: Hematology

## 2020-08-18 DIAGNOSIS — D0512 Intraductal carcinoma in situ of left breast: Secondary | ICD-10-CM

## 2020-08-18 HISTORY — PX: BREAST BIOPSY: SHX20

## 2020-08-18 MED ORDER — GADOBUTROL 1 MMOL/ML IV SOLN
7.0000 mL | Freq: Once | INTRAVENOUS | Status: AC | PRN
  Administered 2020-08-18: 7 mL via INTRAVENOUS

## 2020-08-24 ENCOUNTER — Telehealth: Payer: Self-pay | Admitting: Hematology

## 2020-08-24 NOTE — Telephone Encounter (Signed)
R/s appts on 07/11/2021. Provider in [facility]. Mailing pt appt calendar.

## 2020-09-15 ENCOUNTER — Ambulatory Visit: Payer: Self-pay | Admitting: Surgery

## 2020-09-15 DIAGNOSIS — D241 Benign neoplasm of right breast: Secondary | ICD-10-CM

## 2020-09-15 NOTE — H&P (Signed)
Cynthia Ford Appointment: 09/15/2020 9:20 AM Location: [facility] Patient #: [phone] DOB: 08-03-1961 Single / Language: Cynthia Ford / Race: Black or African American Female  History of Present Illness Cynthia Moores A. Cynthia Satre MD; 09/15/2020 10:42 AM) Patient words: Patient returns for follow-up for a mammographic abnormality/magnetic resonance imaging abnormality of the right breast. An area of linear enhancement was noted on the right core biopsy showed papilloma. She is a history of breast cancer now she underwent left breast lumpectomy 2 years ago for a radial scar. She is a high-risk patient and is followed by magnetic resonance imaging given her history of breast cancer, papilloma, radial scar. She has no complaints today. She does not have any recollection of having a mass, pain or discharge from either breast last 12 months.     Diagnosis Breast, right, needle core biopsy, central - DUCTAL PAPILLOMA.  The patient had a complex sclerosing lesion excised from the right breast in 2019. The patient had a left lumpectomy in 2016 for breast cancer.  LABS: None  EXAM: BILATERAL BREAST MRI WITH AND WITHOUT CONTRAST  TECHNIQUE: Multiplanar, multisequence MR images of both breasts were obtained prior to and following the intravenous administration of 6 ml of Gadavist  Three-dimensional MR images were rendered by post-processing of the original MR data on an independent workstation. The three-dimensional MR images were interpreted, and findings are reported in the following complete MRI report for this study. Three dimensional images were evaluated at the independent DynaCad workstation  COMPARISON: No previous breast MRI. Previous mammography.  FINDINGS: Breast composition: b. Scattered fibroglandular tissue.  Background parenchymal enhancement: Mild  Right breast: There is linear enhancement at 6 o'clock in the right breast at a mid depth seen on series 6,  images 89 through 95 spanning 11 mm in AP dimension. No other suspicious abnormalities are identified in the right breast.  Left breast: Post lumpectomy changes are seen in the lower outer left breast. Mild enhancement around the post lumpectomy changes are likely post therapeutic. No evidence of recurrence. No other suspicious findings in the left breast.  Lymph nodes: No abnormal appearing lymph nodes.  Ancillary findings: None.  IMPRESSION: 1. There is 11 mm of linear enhancement in the mid right breast at 6 o'clock. 2. Mild enhancement surrounding the left lumpectomy site is likely post therapeutic. 3. No other suspicious findings identified.  RECOMMENDATION: Recommend MRI guided biopsy of the linear enhancement in the mid right breast at 6 o'clock.  Recommend six-month follow-up mammography of the probably benign left breast calcifications. The patient will be due for bilateral mammography at that time.  BI-RADS CATEGORY 4: Suspicious.   Electronically Signed By: Cynthia Ford M.D On: 08/11/2020 80:16  59 year old female for six-month follow-up of LEFT breast calcifications at the lumpectomy site.  E     Diagnosis Breast, right, needle core biopsy, central - DUCTAL PAPILLOMA.  The patient is a 59 year old female.   Medication History (Cynthia Ford, CMA; 09/15/2020 9:28 AM) Advair Diskus (250-50MCG/DOSE Aero Pow Br Act, Inhalation) Active. Albuterol Sulfate ((2.5 MG/3ML)0.083% Nebulized Soln, Inhalation) Active. Vitamin D (Oral) Specific strength unknown - Active. Vitamin C (Oral) Specific strength unknown - Active. Anastrozole (1MG  Tablet, Oral) Active. Flovent HFA (110MCG/ACT Aerosol, Inhalation) Active. Fluticasone Propionate (50MCG/ACT Suspension, Nasal as needed) Active. ZyrTEC Allergy (10MG  Tablet, Oral) Active. Medications Reconciled    Vitals (Cynthia Ford CMA; 09/15/2020 9:27 AM) 09/15/2020 9:27 AM Weight: 155.25 lb  Height: 64in Body Surface Area: 1.76 m Body Mass Index: 26.65  kg/m  Temp.: 97.26F  Pulse: 81 (Regular)  P.OX: 97% (Room air) BP: 118/72(Sitting, Left Arm, Standard)        Physical Exam (Cynthia Chavous A. Cynthia Knoff MD; 09/15/2020 10:42 AM)  General Mental Status-Alert. General Appearance-Consistent with stated age. Hydration-Well hydrated. Voice-Normal.  Chest and Lung Exam Chest and lung exam reveals -quiet, even and easy respiratory effort with no use of accessory muscles and on auscultation, normal breath sounds, no adventitious sounds and normal vocal resonance. Inspection Chest Wall - Normal. Back - normal.  Breast Note: Right breast is no mass lesion. Incision where the core was done is noted. Left breast has postsurgical changes noted. No masses. No nipple discharge bilaterally.  Cardiovascular Cardiovascular examination reveals -normal heart sounds, regular rate and rhythm with no murmurs and normal pedal pulses bilaterally.  Neurologic Neurologic evaluation reveals -alert and oriented x 3 with no impairment of recent or remote memory. Mental Status-Normal.  Lymphatic Head & Neck  General Head & Neck Lymphatics: Bilateral - Description - Normal. Axillary  General Axillary Region: Bilateral - Description - Normal. Tenderness - Non Tender.    Assessment & Plan (Cynthia Flicker A. Cynthia Seebeck MD; 09/15/2020 10:43 AM)  PAPILLOMA OF RIGHT BREAST (D24.1) Impression: recommend right breast lumpectomy Discussed pros and cons lumpectomy in this circumstance as well as complications and risk and alternative therapies at our surgery. She has had lumpectomy before his very well aware of the risks and benefits of surgery. She plans on scheduling a right breast seed lumpectomy that X1 to 2 months depending on her schedule and other medical visit she is plan she states. Risk of lumpectomy include bleeding, infection, seroma, more surgery, use of seed/wire, wound care,  cosmetic deformity and the need for other treatments, death , blood clots, death. Pt agrees to proceed.  total time 30 minutes  Current Plans You are being scheduled for surgery- Our schedulers will call you.  You should hear from our office's scheduling department within 5 working days about the location, date, and time of surgery. We try to make accommodations for patient's preferences in scheduling surgery, but sometimes the OR schedule or the surgeon's schedule prevents [place] from making those accommodations.  If you have not heard from our office [phone]) in 5 working days, call the office and ask for your surgeon's nurse.  If you have other questions about your diagnosis, plan, or surgery, call the office and ask for your surgeon's nurse.  Pt Education - [facility] Breast Biopsy HCI: discussed with patient and provided information.  HISTORY OF DUCTAL CARCINOMA IN SITU (DCIS) OF BREAST (Z86.000) Impression: left breast DCIS and intraductal papilloma s/p lumpectomy, ER/ON AROMATASE INHIBITOR RETURN 1 YEAR

## 2020-11-03 ENCOUNTER — Other Ambulatory Visit: Payer: Self-pay | Admitting: Surgery

## 2020-11-03 DIAGNOSIS — D241 Benign neoplasm of right breast: Secondary | ICD-10-CM

## 2020-12-06 ENCOUNTER — Encounter (HOSPITAL_BASED_OUTPATIENT_CLINIC_OR_DEPARTMENT_OTHER): Payer: Self-pay | Admitting: Surgery

## 2020-12-06 ENCOUNTER — Other Ambulatory Visit: Payer: Self-pay

## 2020-12-09 ENCOUNTER — Other Ambulatory Visit (HOSPITAL_COMMUNITY)
Admission: RE | Admit: 2020-12-09 | Discharge: 2020-12-09 | Disposition: A | Discharge status: Home / Self Care | Payer: 59 | Source: Ambulatory Visit | Attending: Surgery | Admitting: Surgery

## 2020-12-09 DIAGNOSIS — Z20822 Contact with and (suspected) exposure to covid-19: Secondary | ICD-10-CM | POA: Insufficient documentation

## 2020-12-09 DIAGNOSIS — Z01812 Encounter for preprocedural laboratory examination: Secondary | ICD-10-CM | POA: Diagnosis present

## 2020-12-10 LAB — SARS CORONAVIRUS 2 (TAT 6-24 HRS): SARS Coronavirus 2: NEGATIVE

## 2020-12-11 ENCOUNTER — Encounter (HOSPITAL_BASED_OUTPATIENT_CLINIC_OR_DEPARTMENT_OTHER)
Admission: RE | Admit: 2020-12-11 | Discharge: 2020-12-11 | Disposition: A | Discharge status: Home / Self Care | Payer: 59 | Source: Ambulatory Visit | Attending: Surgery | Admitting: Surgery

## 2020-12-11 DIAGNOSIS — Z01812 Encounter for preprocedural laboratory examination: Secondary | ICD-10-CM | POA: Insufficient documentation

## 2020-12-11 DIAGNOSIS — D241 Benign neoplasm of right breast: Secondary | ICD-10-CM | POA: Diagnosis not present

## 2020-12-11 LAB — CBC WITH DIFFERENTIAL/PLATELET
Abs Immature Granulocytes: 0.03 10*3/uL (ref 0.00–0.07)
Basophils Absolute: 0.1 10*3/uL (ref 0.0–0.1)
Basophils Relative: 1 %
Eosinophils Absolute: 0.5 10*3/uL (ref 0.0–0.5)
Eosinophils Relative: 6 %
HCT: 38.2 % (ref 36.0–46.0)
Hemoglobin: 12.9 g/dL (ref 12.0–15.0)
Immature Granulocytes: 0 %
Lymphocytes Relative: 17 %
Lymphs Abs: 1.5 10*3/uL (ref 0.7–4.0)
MCH: 26.3 pg (ref 26.0–34.0)
MCHC: 33.8 g/dL (ref 30.0–36.0)
MCV: 78 fL — ABNORMAL LOW (ref 80.0–100.0)
Monocytes Absolute: 0.7 10*3/uL (ref 0.1–1.0)
Monocytes Relative: 8 %
Neutro Abs: 5.8 10*3/uL (ref 1.7–7.7)
Neutrophils Relative %: 68 %
Platelets: 318 10*3/uL (ref 150–400)
RBC: 4.9 MIL/uL (ref 3.87–5.11)
RDW: 13.5 % (ref 11.5–15.5)
WBC: 8.5 10*3/uL (ref 4.0–10.5)
nRBC: 0 % (ref 0.0–0.2)

## 2020-12-11 LAB — COMPREHENSIVE METABOLIC PANEL
ALT: 34 U/L (ref 0–44)
AST: 26 U/L (ref 15–41)
Albumin: 3.8 g/dL (ref 3.5–5.0)
Alkaline Phosphatase: 86 U/L (ref 38–126)
Anion gap: 10 (ref 5–15)
BUN: 12 mg/dL (ref 6–20)
CO2: 28 mmol/L (ref 22–32)
Calcium: 9.6 mg/dL (ref 8.9–10.3)
Chloride: 102 mmol/L (ref 98–111)
Creatinine, Ser: 0.94 mg/dL (ref 0.44–1.00)
GFR, Estimated: >60 mL/min (ref >60)
Glucose, Bld: 84 mg/dL (ref 70–99)
Potassium: 4.1 mmol/L (ref 3.5–5.1)
Sodium: 140 mmol/L (ref 135–145)
Total Bilirubin: 0.5 mg/dL (ref 0.3–1.2)
Total Protein: 7.1 g/dL (ref 6.5–8.1)

## 2020-12-11 NOTE — Progress Notes (Signed)

## 2020-12-12 ENCOUNTER — Ambulatory Visit
Admission: RE | Admit: 2020-12-12 | Discharge: 2020-12-12 | Disposition: A | Discharge status: Home / Self Care | Payer: 59 | Source: Ambulatory Visit | Attending: Surgery | Admitting: Surgery

## 2020-12-12 ENCOUNTER — Other Ambulatory Visit: Payer: Self-pay

## 2020-12-12 DIAGNOSIS — D241 Benign neoplasm of right breast: Secondary | ICD-10-CM

## 2020-12-13 ENCOUNTER — Encounter (HOSPITAL_BASED_OUTPATIENT_CLINIC_OR_DEPARTMENT_OTHER): Payer: Self-pay | Admitting: Surgery

## 2020-12-13 ENCOUNTER — Ambulatory Visit (HOSPITAL_BASED_OUTPATIENT_CLINIC_OR_DEPARTMENT_OTHER): Payer: 59 | Admitting: Anesthesiology

## 2020-12-13 ENCOUNTER — Encounter (HOSPITAL_BASED_OUTPATIENT_CLINIC_OR_DEPARTMENT_OTHER)
Admission: RE | Disposition: A | Discharge status: Home / Self Care | Payer: Self-pay | Source: Home / Self Care | Attending: Surgery

## 2020-12-13 ENCOUNTER — Ambulatory Visit (HOSPITAL_BASED_OUTPATIENT_CLINIC_OR_DEPARTMENT_OTHER)
Admission: RE | Admit: 2020-12-13 | Discharge: 2020-12-13 | Disposition: A | Discharge status: Home / Self Care | Payer: 59 | Attending: Surgery | Admitting: Surgery

## 2020-12-13 ENCOUNTER — Ambulatory Visit
Admission: RE | Admit: 2020-12-13 | Discharge: 2020-12-13 | Disposition: A | Discharge status: Home / Self Care | Payer: 59 | Source: Ambulatory Visit | Attending: Surgery | Admitting: Surgery

## 2020-12-13 DIAGNOSIS — J45909 Unspecified asthma, uncomplicated: Secondary | ICD-10-CM | POA: Insufficient documentation

## 2020-12-13 DIAGNOSIS — D241 Benign neoplasm of right breast: Secondary | ICD-10-CM

## 2020-12-13 DIAGNOSIS — N6021 Fibroadenosis of right breast: Secondary | ICD-10-CM | POA: Diagnosis not present

## 2020-12-13 DIAGNOSIS — Z853 Personal history of malignant neoplasm of breast: Secondary | ICD-10-CM | POA: Diagnosis not present

## 2020-12-13 HISTORY — PX: BREAST LUMPECTOMY WITH RADIOACTIVE SEED LOCALIZATION: SHX6424

## 2020-12-13 HISTORY — PX: BREAST EXCISIONAL BIOPSY: SUR124

## 2020-12-13 SURGERY — BREAST LUMPECTOMY WITH RADIOACTIVE SEED LOCALIZATION
Anesthesia: General | Site: Breast | Laterality: Right

## 2020-12-13 MED ORDER — LIDOCAINE HCL (CARDIAC) PF 100 MG/5ML IV SOSY
PREFILLED_SYRINGE | INTRAVENOUS | Status: DC | PRN
  Administered 2020-12-13: 50 mg via INTRAVENOUS

## 2020-12-13 MED ORDER — MEPERIDINE HCL 25 MG/ML IJ SOLN: 6.2500 mg | INTRAMUSCULAR | Status: DC | PRN

## 2020-12-13 MED ORDER — DEXAMETHASONE SODIUM PHOSPHATE 10 MG/ML IJ SOLN
INTRAMUSCULAR | Status: AC
  Filled 2020-12-13: qty 1

## 2020-12-13 MED ORDER — BUPIVACAINE-EPINEPHRINE (PF) 0.25% -1:200000 IJ SOLN
INTRAMUSCULAR | Status: AC
  Filled 2020-12-13: qty 30

## 2020-12-13 MED ORDER — LIDOCAINE 2% (20 MG/ML) 5 ML SYRINGE
INTRAMUSCULAR | Status: AC
  Filled 2020-12-13: qty 5

## 2020-12-13 MED ORDER — HYDROMORPHONE HCL 1 MG/ML IJ SOLN: 0.2500 mg | INTRAMUSCULAR | Status: DC | PRN

## 2020-12-13 MED ORDER — PHENYLEPHRINE 40 MCG/ML (10ML) SYRINGE FOR IV PUSH (FOR BLOOD PRESSURE SUPPORT)
PREFILLED_SYRINGE | INTRAVENOUS | Status: AC
  Filled 2020-12-13: qty 10

## 2020-12-13 MED ORDER — ACETAMINOPHEN 500 MG PO TABS
1000.0000 mg | ORAL_TABLET | ORAL | Status: AC
  Administered 2020-12-13: 1000 mg via ORAL

## 2020-12-13 MED ORDER — CEFAZOLIN SODIUM-DEXTROSE 2-4 GM/100ML-% IV SOLN
2.0000 g | INTRAVENOUS | Status: AC
  Administered 2020-12-13: 2 g via INTRAVENOUS

## 2020-12-13 MED ORDER — FENTANYL CITRATE (PF) 100 MCG/2ML IJ SOLN
INTRAMUSCULAR | Status: AC
  Filled 2020-12-13: qty 2

## 2020-12-13 MED ORDER — DROPERIDOL 2.5 MG/ML IJ SOLN
INTRAMUSCULAR | Status: DC | PRN
  Administered 2020-12-13: .625 mg via INTRAVENOUS

## 2020-12-13 MED ORDER — PROMETHAZINE HCL 25 MG/ML IJ SOLN: 6.2500 mg | INTRAMUSCULAR | Status: DC | PRN

## 2020-12-13 MED ORDER — PROPOFOL 10 MG/ML IV BOLUS
INTRAVENOUS | Status: DC | PRN
  Administered 2020-12-13: 150 mg via INTRAVENOUS

## 2020-12-13 MED ORDER — CEFAZOLIN SODIUM-DEXTROSE 2-4 GM/100ML-% IV SOLN
INTRAVENOUS | Status: AC
  Filled 2020-12-13: qty 100

## 2020-12-13 MED ORDER — OXYCODONE HCL 5 MG PO TABS: 5.0000 mg | ORAL_TABLET | Freq: Once | ORAL | Status: DC | PRN

## 2020-12-13 MED ORDER — FENTANYL CITRATE (PF) 100 MCG/2ML IJ SOLN
INTRAMUSCULAR | Status: DC | PRN
  Administered 2020-12-13 (×2): 50 ug via INTRAVENOUS

## 2020-12-13 MED ORDER — GABAPENTIN 300 MG PO CAPS
300.0000 mg | ORAL_CAPSULE | ORAL | Status: AC
  Administered 2020-12-13: 300 mg via ORAL

## 2020-12-13 MED ORDER — CELECOXIB 200 MG PO CAPS
200.0000 mg | ORAL_CAPSULE | ORAL | Status: AC
  Administered 2020-12-13: 200 mg via ORAL

## 2020-12-13 MED ORDER — EPHEDRINE SULFATE 50 MG/ML IJ SOLN
INTRAMUSCULAR | Status: DC | PRN
  Administered 2020-12-13: 10 mg via INTRAVENOUS

## 2020-12-13 MED ORDER — CELECOXIB 200 MG PO CAPS
ORAL_CAPSULE | ORAL | Status: AC
  Filled 2020-12-13: qty 1

## 2020-12-13 MED ORDER — ACETAMINOPHEN 500 MG PO TABS
ORAL_TABLET | ORAL | Status: AC
  Filled 2020-12-13: qty 2

## 2020-12-13 MED ORDER — DEXAMETHASONE SODIUM PHOSPHATE 4 MG/ML IJ SOLN
INTRAMUSCULAR | Status: DC | PRN
  Administered 2020-12-13: 5 mg via INTRAVENOUS

## 2020-12-13 MED ORDER — EPHEDRINE 5 MG/ML INJ
INTRAVENOUS | Status: AC
  Filled 2020-12-13: qty 10

## 2020-12-13 MED ORDER — OXYCODONE HCL 5 MG/5ML PO SOLN: 5.0000 mg | Freq: Once | ORAL | Status: DC | PRN

## 2020-12-13 MED ORDER — BUPIVACAINE-EPINEPHRINE (PF) 0.25% -1:200000 IJ SOLN
INTRAMUSCULAR | Status: DC | PRN
  Administered 2020-12-13: 20 mL

## 2020-12-13 MED ORDER — MIDAZOLAM HCL 2 MG/2ML IJ SOLN
INTRAMUSCULAR | Status: AC
  Filled 2020-12-13: qty 2

## 2020-12-13 MED ORDER — GABAPENTIN 300 MG PO CAPS
ORAL_CAPSULE | ORAL | Status: AC
  Filled 2020-12-13: qty 1

## 2020-12-13 MED ORDER — CHLORHEXIDINE GLUCONATE CLOTH 2 % EX PADS: 6.0000 | MEDICATED_PAD | Freq: Once | CUTANEOUS | Status: DC

## 2020-12-13 MED ORDER — PROPOFOL 500 MG/50ML IV EMUL
INTRAVENOUS | Status: AC
  Filled 2020-12-13: qty 50

## 2020-12-13 MED ORDER — IBUPROFEN 800 MG PO TABS: 800.0000 mg | ORAL_TABLET | Freq: Three times a day (TID) | ORAL | 0 refills | Status: DC | PRN

## 2020-12-13 MED ORDER — MIDAZOLAM HCL 5 MG/5ML IJ SOLN
INTRAMUSCULAR | Status: DC | PRN
  Administered 2020-12-13: 2 mg via INTRAVENOUS

## 2020-12-13 MED ORDER — SUCCINYLCHOLINE CHLORIDE 200 MG/10ML IV SOSY
PREFILLED_SYRINGE | INTRAVENOUS | Status: AC
  Filled 2020-12-13: qty 10

## 2020-12-13 MED ORDER — ONDANSETRON HCL 4 MG/2ML IJ SOLN
INTRAMUSCULAR | Status: AC
  Filled 2020-12-13: qty 2

## 2020-12-13 MED ORDER — HYDROCODONE-ACETAMINOPHEN 5-325 MG PO TABS: 1.0000 | ORAL_TABLET | Freq: Four times a day (QID) | ORAL | 0 refills | Status: DC | PRN

## 2020-12-13 MED ORDER — LACTATED RINGERS IV SOLN: INTRAVENOUS | Status: DC

## 2020-12-13 SURGICAL SUPPLY — 51 items
ADH SKN CLS APL DERMABOND .7 (GAUZE/BANDAGES/DRESSINGS) ×1
APL PRP STRL LF DISP 70% ISPRP (MISCELLANEOUS) ×1
APPLIER CLIP 9.375 MED OPEN (MISCELLANEOUS)
APR CLP MED 9.3 20 MLT OPN (MISCELLANEOUS)
BINDER BREAST LRG (GAUZE/BANDAGES/DRESSINGS) ×2 IMPLANT
BINDER BREAST MEDIUM (GAUZE/BANDAGES/DRESSINGS) IMPLANT
BINDER BREAST XLRG (GAUZE/BANDAGES/DRESSINGS) IMPLANT
BINDER BREAST XXLRG (GAUZE/BANDAGES/DRESSINGS) IMPLANT
BLADE SURG 15 STRL LF DISP TIS (BLADE) ×1 IMPLANT
BLADE SURG 15 STRL SS (BLADE) ×2
CANISTER SUC SOCK COL 7IN (MISCELLANEOUS) IMPLANT
CANISTER SUCT 1200ML W/VALVE (MISCELLANEOUS) IMPLANT
CHLORAPREP W/TINT 26 (MISCELLANEOUS) ×2 IMPLANT
CLIP APPLIE 9.375 MED OPEN (MISCELLANEOUS) IMPLANT
COVER BACK TABLE 60X90IN (DRAPES) ×2 IMPLANT
COVER MAYO STAND STRL (DRAPES) ×2 IMPLANT
COVER PROBE W GEL 5X96 (DRAPES) ×2 IMPLANT
COVER WAND RF STERILE (DRAPES) IMPLANT
DECANTER SPIKE VIAL GLASS SM (MISCELLANEOUS) ×2 IMPLANT
DERMABOND ADVANCED (GAUZE/BANDAGES/DRESSINGS) ×1
DERMABOND ADVANCED .7 DNX12 (GAUZE/BANDAGES/DRESSINGS) ×1 IMPLANT
DRAPE LAPAROSCOPIC ABDOMINAL (DRAPES) IMPLANT
DRAPE LAPAROTOMY 100X72 PEDS (DRAPES) ×2 IMPLANT
DRAPE UTILITY XL STRL (DRAPES) ×2 IMPLANT
ELECT COATED BLADE 2.86 ST (ELECTRODE) ×2 IMPLANT
ELECT REM PT RETURN 9FT ADLT (ELECTROSURGICAL) ×2
ELECTRODE REM PT RTRN 9FT ADLT (ELECTROSURGICAL) ×1 IMPLANT
GLOVE ECLIPSE 8.0 STRL XLNG CF (GLOVE) ×2 IMPLANT
GLOVE SRG 8 PF TXTR STRL LF DI (GLOVE) ×1 IMPLANT
GLOVE SURG UNDER POLY LF SZ8 (GLOVE) ×2
GOWN STRL REUS W/ TWL LRG LVL3 (GOWN DISPOSABLE) ×2 IMPLANT
GOWN STRL REUS W/ TWL XL LVL3 (GOWN DISPOSABLE) ×1 IMPLANT
GOWN STRL REUS W/TWL LRG LVL3 (GOWN DISPOSABLE) ×4
GOWN STRL REUS W/TWL XL LVL3 (GOWN DISPOSABLE) ×2
HEMOSTAT ARISTA ABSORB 3G PWDR (HEMOSTASIS) IMPLANT
HEMOSTAT SNOW SURGICEL 2X4 (HEMOSTASIS) IMPLANT
KIT MARKER MARGIN INK (KITS) ×2 IMPLANT
NEEDLE HYPO 25X1 1.5 SAFETY (NEEDLE) ×2 IMPLANT
NS IRRIG 1000ML POUR BTL (IV SOLUTION) ×2 IMPLANT
PACK BASIN DAY SURGERY FS (CUSTOM PROCEDURE TRAY) ×2 IMPLANT
PENCIL SMOKE EVACUATOR (MISCELLANEOUS) ×2 IMPLANT
SLEEVE SCD COMPRESS KNEE MED (MISCELLANEOUS) ×2 IMPLANT
SPONGE LAP 4X18 RFD (DISPOSABLE) ×2 IMPLANT
SUT MNCRL AB 4-0 PS2 18 (SUTURE) ×2 IMPLANT
SUT SILK 2 0 SH (SUTURE) IMPLANT
SUT VICRYL 3-0 CR8 SH (SUTURE) ×2 IMPLANT
SYR CONTROL 10ML LL (SYRINGE) ×2 IMPLANT
TOWEL GREEN STERILE FF (TOWEL DISPOSABLE) ×2 IMPLANT
TRAY FAXITRON CT DISP (TRAY / TRAY PROCEDURE) ×2 IMPLANT
TUBE CONNECTING 20X1/4 (TUBING) IMPLANT
YANKAUER SUCT BULB TIP NO VENT (SUCTIONS) IMPLANT

## 2020-12-13 NOTE — Anesthesia Postprocedure Evaluation (Signed)
Anesthesia Post Note  Patient: Cynthia Ford  Procedure(s) Performed: RIGHT BREAST LUMPECTOMY WITH RADIOACTIVE SEED LOCALIZATION (Right Breast)     Patient location during evaluation: PACU Anesthesia Type: General Level of consciousness: awake and alert Pain management: pain level controlled Vital Signs Assessment: post-procedure vital signs reviewed and stable Respiratory status: spontaneous breathing, nonlabored ventilation and respiratory function stable Cardiovascular status: blood pressure returned to baseline and stable Postop Assessment: no apparent nausea or vomiting Anesthetic complications: no   No complications documented.  Last Vitals:  Vitals:   12/13/20 1445 12/13/20 1459  BP: 108/74 119/72  Pulse: 64 62  Resp: 11 15  Temp:  36.6 C  SpO2: 96% 99%    Last Pain:  Vitals:   12/13/20 1459  TempSrc:   PainSc: 0-No pain                 Lynda Rainwater

## 2020-12-13 NOTE — Discharge Instructions (Signed)
[place] Office Phone Number [phone]  BREAST BIOPSY/ PARTIAL MASTECTOMY: POST OP INSTRUCTIONS  Always review your discharge instruction sheet given to you by the facility where your surgery was performed.  IF YOU HAVE DISABILITY OR FAMILY LEAVE FORMS, YOU MUST BRING THEM TO THE OFFICE FOR PROCESSING.  DO NOT GIVE THEM TO YOUR DOCTOR.  1. A prescription for pain medication may be given to you upon discharge.  Take your pain medication as prescribed, if needed.  If narcotic pain medicine is not needed, then you may take acetaminophen (Tylenol) or ibuprofen (Advil) as needed. 2. Take your usually prescribed medications unless otherwise directed 3. If you need a refill on your pain medication, please contact your pharmacy.  They will contact our office to request authorization.  Prescriptions will not be filled after 5pm or on week-ends. 4. You should eat very light the first 24 hours after surgery, such as soup, crackers, pudding, etc.  Resume your normal diet the day after surgery. 5. Most patients will experience some swelling and bruising in the breast.  Ice packs and a good support bra will help.  Swelling and bruising can take several days to resolve.  6. It is common to experience some constipation if taking pain medication after surgery.  Increasing fluid intake and taking a stool softener will usually help or prevent this problem from occurring.  A mild laxative (Milk of Magnesia or Miralax) should be taken according to package directions if there are no bowel movements after 48 hours. 7. Unless discharge instructions indicate otherwise, you may remove your bandages 24-48 hours after surgery, and you may shower at that time.  You may have steri-strips (small skin tapes) in place directly over the incision.  These strips should be left on the skin for 7-10 days.  If your surgeon used skin glue on the incision, you may shower in 24 hours.  The glue will flake off over the  next 2-3 weeks.  Any sutures or staples will be removed at the office during your follow-up visit. 8. ACTIVITIES:  You may resume regular daily activities (gradually increasing) beginning the next day.  Wearing a good support bra or sports bra minimizes pain and swelling.  You may have sexual intercourse when it is comfortable. a. You may drive when you no longer are taking prescription pain medication, you can comfortably wear a seatbelt, and you can safely maneuver your car and apply brakes. b. RETURN TO WORK:  ______________________________________________________________________________________ 9. You should see your doctor in the office for a follow-up appointment approximately two weeks after your surgery.  Your doctor's nurse will typically make your follow-up appointment when she calls you with your pathology report.  Expect your pathology report 2-3 business days after your surgery.  You may call to check if you do not hear from [place] after three days. 10. OTHER INSTRUCTIONS: _______________________________________________________________________________________________ _____________________________________________________________________________________________________________________________________ _____________________________________________________________________________________________________________________________________ _____________________________________________________________________________________________________________________________________  WHEN TO CALL YOUR DOCTOR: 1. Fever over 101.0 2. Nausea and/or vomiting. 3. Extreme swelling or bruising. 4. Continued bleeding from incision. 5. Increased pain, redness, or drainage from the incision.  The clinic staff is available to answer your questions during regular business hours.  Please don't hesitate to call and ask to speak to one of the nurses for clinical concerns.  If you have a medical emergency, go to the nearest  emergency room or call 911.  A surgeon from [facility] is always on call at the hospital.  For further questions, please visit [email or web address]  Post Anesthesia Home Care Instructions  Activity: Get plenty of rest for the remainder of the day. A responsible individual must stay with you for 24 hours following the procedure.  For the next 24 hours, DO NOT: -Drive a car -Paediatric nurse -Drink alcoholic beverages -Take any medication unless instructed by your physician -Make any legal decisions or sign important papers.  Meals: Start with liquid foods such as gelatin or soup. Progress to regular foods as tolerated. Avoid greasy, spicy, heavy foods. If nausea and/or vomiting occur, drink only clear liquids until the nausea and/or vomiting subsides. Call your physician if vomiting continues.  Special Instructions/Symptoms: Your throat may feel dry or sore from the anesthesia or the breathing tube placed in your throat during surgery. If this causes discomfort, gargle with warm salt water. The discomfort should disappear within 24 hours.  If you had a scopolamine patch placed behind your ear for the management of post- operative nausea and/or vomiting:  1. The medication in the patch is effective for 72 hours, after which it should be removed.  Wrap patch in a tissue and discard in the trash. Wash hands thoroughly with soap and water. 2. You may remove the patch earlier than 72 hours if you experience unpleasant side effects which may include dry mouth, dizziness or visual disturbances. 3. Avoid touching the patch. Wash your hands with soap and water after contact with the patch.    May take Tylenol and Ibuprofen after 5:44pm, if needed.

## 2020-12-13 NOTE — Anesthesia Procedure Notes (Signed)
Procedure Name: LMA Insertion Date/Time: 12/13/2020 1:10 PM Performed by: Willa Frater, CRNA Pre-anesthesia Checklist: Patient identified, Emergency Drugs available, Suction available and Patient being monitored Patient Re-evaluated:Patient Re-evaluated prior to induction Oxygen Delivery Method: Circle system utilized Preoxygenation: Pre-oxygenation with 100% oxygen Induction Type: IV induction Ventilation: Mask ventilation without difficulty LMA: LMA inserted LMA Size: 3.0 Number of attempts: 1 Airway Equipment and Method: Bite block Placement Confirmation: positive ETCO2 Tube secured with: Tape Dental Injury: Teeth and Oropharynx as per pre-operative assessment

## 2020-12-13 NOTE — H&P (Signed)
Cynthia Ford  Location: [facility] Patient #: [phone] DOB: 1961/11/12 Single / Language: [name] / Race: Black or African American Female  History of Present Illness ( AM) Patient words: Patient returns for follow-up for a mammographic abnormality/magnetic resonance imaging abnormality of the right breast. An area of linear enhancement was noted on the right core biopsy showed papilloma. She is a history of breast cancer now she underwent left breast lumpectomy 2 years ago for a radial scar. She is a high-risk patient and is followed by magnetic resonance imaging given her history of breast cancer, papilloma, radial scar. She has no complaints today. She does not have any recollection of having a mass, pain or discharge from either breast last 12 months.     Diagnosis Breast, right, needle core biopsy, central - DUCTAL PAPILLOMA.  The patient had a complex sclerosing lesion excised from the right breast in 2019. The patient had a left lumpectomy in 2016 for breast cancer.  LABS: None  EXAM: BILATERAL BREAST MRI WITH AND WITHOUT CONTRAST  TECHNIQUE: Multiplanar, multisequence MR images of both breasts were obtained prior to and following the intravenous administration of 6 ml of Gadavist  Three-dimensional MR images were rendered by post-processing of the original MR data on an independent workstation. The three-dimensional MR images were interpreted, and findings are reported in the following complete MRI report for this study. Three dimensional images were evaluated at the independent DynaCad workstation  COMPARISON: No previous breast MRI. Previous mammography.  FINDINGS: Breast composition: b. Scattered fibroglandular tissue.  Background parenchymal enhancement: Mild  Right breast: There is linear enhancement at 6 o'clock in the right breast at a mid depth seen on series 6, images 89 through 95 spanning 11 mm in AP dimension. No  other suspicious abnormalities are identified in the right breast.  Left breast: Post lumpectomy changes are seen in the lower outer left breast. Mild enhancement around the post lumpectomy changes are likely post therapeutic. No evidence of recurrence. No other suspicious findings in the left breast.  Lymph nodes: No abnormal appearing lymph nodes.  Ancillary findings: None.  IMPRESSION: 1. There is 11 mm of linear enhancement in the mid right breast at 6 o'clock. 2. Mild enhancement surrounding the left lumpectomy site is likely post therapeutic. 3. No other suspicious findings identified.  RECOMMENDATION: Recommend MRI guided biopsy of the linear enhancement in the mid right breast at 6 o'clock.  Recommend six-month follow-up mammography of the probably benign left breast calcifications. The patient will be due for bilateral mammography at that time.  BI-RADS CATEGORY 4: Suspicious.   Electronically Signed By: Dorise Bullion III M.D On: 08/11/2020 110:54  59 year old female for six-month follow-up of LEFT breast calcifications at the lumpectomy site.  E     Diagnosis Breast, right, needle core biopsy, central - DUCTAL PAPILLOMA.  The patient is a 59 year old female.   Medication History Advair Diskus (250-50MCG/DOSE Aero Pow Br Act, Inhalation) Active. Albuterol Sulfate ((2.5 MG/3ML)0.083% Nebulized Soln, Inhalation) Active. Vitamin D (Oral) Specific strength unknown - Active. Vitamin C (Oral) Specific strength unknown - Active. Anastrozole (1MG  Tablet, Oral) Active. Flovent HFA (110MCG/ACT Aerosol, Inhalation) Active. Fluticasone Propionate (50MCG/ACT Suspension, Nasal as needed) Active. ZyrTEC Allergy (10MG  Tablet, Oral) Active. Medications Reconciled    Vitals  09/15/2020 9:27 AM Weight: 155.25 lb Height: 64in Body Surface Area: 1.76 m Body Mass Index: 26.65 kg/m  Temp.: 97.3F  Pulse: 81 (Regular)   P.OX: 97% (Room air) BP: 118/72(Sitting, Left Arm, Standard)  Physical Exam  General Mental Status-Alert. General Appearance-Consistent with stated age. Hydration-Well hydrated. Voice-Normal.  Chest and Lung Exam Chest and lung exam reveals -quiet, even and easy respiratory effort with no use of accessory muscles and on auscultation, normal breath sounds, no adventitious sounds and normal vocal resonance. Inspection Chest Wall - Normal. Back - normal.  Breast Note: Right breast is no mass lesion. Incision where the core was done is noted. Left breast has postsurgical changes noted. No masses. No nipple discharge bilaterally.  Cardiovascular Cardiovascular examination reveals -normal heart sounds, regular rate and rhythm with no murmurs and normal pedal pulses bilaterally.  Neurologic Neurologic evaluation reveals -alert and oriented x 3 with no impairment of recent or remote memory. Mental Status-Normal.  Lymphatic Head & Neck  General Head & Neck Lymphatics: Bilateral - Description - Normal. Axillary  General Axillary Region: Bilateral - Description - Normal. Tenderness - Non Tender.    Assessment & Plan   PAPILLOMA OF RIGHT BREAST (D24.1) Impression: recommend right breast lumpectomy Discussed pros and cons lumpectomy in this circumstance as well as complications and risk and alternative therapies at our surgery. She has had lumpectomy before his very well aware of the risks and benefits of surgery. She plans on scheduling a right breast seed lumpectomy that X1 to 2 months depending on her schedule and other medical visit she is plan she states. Risk of lumpectomy include bleeding, infection, seroma, more surgery, use of seed/wire, wound care, cosmetic deformity and the need for other treatments, death , blood clots, death. Pt agrees to proceed.  total time 30 minutes  Current Plans You are being scheduled for surgery-  Our schedulers will call you.  You should hear from our office's scheduling department within 5 working days about the location, date, and time of surgery. We try to make accommodations for patient's preferences in scheduling surgery, but sometimes the OR schedule or the surgeon's schedule prevents [place] from making those accommodations.  If you have not heard from our office [phone]) in 5 working days, call the office and ask for your surgeon's nurse.  If you have other questions about your diagnosis, plan, or surgery, call the office and ask for your surgeon's nurse.  Pt Education - [facility] Breast Biopsy HCI: discussed with patient and provided information.  HISTORY OF DUCTAL CARCINOMA IN SITU (DCIS) OF BREAST (Z86.000) Impression: left breast DCIS and intraductal papilloma s/p lumpectomy, ER/ON AROMATASE INHIBITOR RETURN 1 YEAR

## 2020-12-13 NOTE — Transfer of Care (Signed)
Immediate Anesthesia Transfer of Care Note  Patient: [place] DOST  Procedure(s) Performed: RIGHT BREAST LUMPECTOMY WITH RADIOACTIVE SEED LOCALIZATION (Right Breast)  Patient Location: PACU  Anesthesia Type:General  Level of Consciousness: sedated  Airway & Oxygen Therapy: Patient Spontanous Breathing and Patient connected to face mask oxygen  Post-op Assessment: Report given to RN and Post -op Vital signs reviewed and stable  Post vital signs: Reviewed and stable  Last Vitals:  Vitals Value Taken Time  BP    Temp    Pulse    Resp    SpO2      Last Pain:  Vitals:   12/13/20 1140  TempSrc: Oral  PainSc: 0-No pain      Patients Stated Pain Goal: 7 (48/[id number]/39 [id number])  Complications: No complications documented.

## 2020-12-13 NOTE — Interval H&P Note (Signed)
History and Physical Interval Note:  12/13/2020 12:40 PM  Cynthia Ford  has presented today for surgery, with the diagnosis of RIGHT BREAST PAPILLOMA.  The various methods of treatment have been discussed with the patient and family. After consideration of risks, benefits and other options for treatment, the patient has consented to  Procedure(s): RIGHT BREAST LUMPECTOMY WITH RADIOACTIVE SEED LOCALIZATION (Right) as a surgical intervention.  The patient's history has been reviewed, patient examined, no change in status, stable for surgery.  I have reviewed the patient's chart and labs.  Questions were answered to the patient's satisfaction.     Turner Daniels MD

## 2020-12-13 NOTE — Op Note (Signed)
Preoperative diagnosis: Right breast papilloma  Postoperative diagnosis: Same  Procedure: Right breast seed localized lumpectomy  Surgeon: Erroll Luna, MD  Anesthesia: LMA with 0.25% Marcaine plain  EBL: 20 cc  Specimen right breast tissue with seed and clip in specimen verified by Faxitron  Drains: None  IV fluids: Per anesthesia record  Indications for procedure: The patient presents for right breast seed localized lumpectomy secondary to abnormal mammogram.  Core biopsy showed a papillary lesion and excision recommended.  Of note she has a history of DCIS in the past.The procedure has been discussed with the patient. Alternatives to surgery have been discussed with the patient.  Risks of surgery include bleeding,  Infection,  Seroma formation, death,  and the need for further surgery.   The patient understands and wishes to proceed.   Description of procedure: The patient presents for outpatient surgery to remove right breast papilloma.  The seed was placed as an outpatient.  All questions were answered the holding area.  Neoprobe used to verify seed activity location right breast which was central.  Films were available for review.  She was then taken back to the abdomen.  She is placed supine upon the OR table.  After induction of general esthesia, right breast was prepped and draped in sterile fashion timeout performed.  Proper patient, site and procedure verified.  She received appropriate preoperative antibiotics.  Neoprobe used and the seed was identified below the right nipple.  Curvilinear incision made along the inferior border of the nipple areolar complex.  Dissection was carried down all tissue around the seed and clip were excised with a grossly negative margin.  The Faxitron image revealed both seed and clip to be in the specimen.  Of note it was oriented with ink and sent to pathology.  The cavities made hemostatic with cautery and closed with a deep layer 3-0 Vicryl and 4  Monocryl for the skin.  Dermabond applied.  All counts found to be correct.  The patient was awoke extubated taken to recovery in satisfactory condition.

## 2020-12-13 NOTE — Anesthesia Preprocedure Evaluation (Signed)
Anesthesia Evaluation  Patient identified by MRN, date of birth, ID band Patient awake    Reviewed: Allergy & Precautions, NPO status , Patient's Chart, lab work & pertinent test results  History of Anesthesia Complications (+) PONV  Airway Mallampati: II  TM Distance: >3 FB Neck ROM: Full    Dental  (+) Missing, Caps, Dental Advisory Given   Pulmonary asthma ,    breath sounds clear to auscultation       Cardiovascular negative cardio ROS   Rhythm:Regular Rate:Normal     Neuro/Psych Anxiety negative neurological ROS     GI/Hepatic Neg liver ROS, GERD  ,  Endo/Other  negative endocrine ROS  Renal/GU negative Renal ROS     Musculoskeletal   Abdominal   Peds  Hematology negative hematology ROS (+)   Anesthesia Other Findings Breast cancer: XRT  Reproductive/Obstetrics                             Anesthesia Physical  Anesthesia Plan  ASA: II  Anesthesia Plan: General   Post-op Pain Management:    Induction: Intravenous  PONV Risk Score and Plan: 4 or greater and Ondansetron, Dexamethasone, Midazolam, Droperidol and Treatment may vary due to age or medical condition  Airway Management Planned: LMA  Additional Equipment:   Intra-op Plan:   Post-operative Plan: Extubation in OR  Informed Consent: I have reviewed the patients History and Physical, chart, labs and discussed the procedure including the risks, benefits and alternatives for the proposed anesthesia with the patient or authorized representative who has indicated his/her understanding and acceptance.     Dental advisory given  Plan Discussed with: CRNA and Surgeon  Anesthesia Plan Comments: (Plan routine monitors, GA- LMA OK)        Anesthesia Quick Evaluation

## 2020-12-14 ENCOUNTER — Encounter (HOSPITAL_BASED_OUTPATIENT_CLINIC_OR_DEPARTMENT_OTHER): Payer: Self-pay | Admitting: Surgery

## 2020-12-15 LAB — SURGICAL PATHOLOGY

## 2021-01-30 ENCOUNTER — Ambulatory Visit
Admission: RE | Admit: 2021-01-30 | Discharge: 2021-01-30 | Disposition: A | Discharge status: Home / Self Care | Payer: 59 | Source: Ambulatory Visit | Attending: Hematology | Admitting: Hematology

## 2021-01-30 ENCOUNTER — Other Ambulatory Visit: Payer: Self-pay

## 2021-01-30 DIAGNOSIS — R921 Mammographic calcification found on diagnostic imaging of breast: Secondary | ICD-10-CM

## 2021-01-30 DIAGNOSIS — Z853 Personal history of malignant neoplasm of breast: Secondary | ICD-10-CM

## 2021-06-16 ENCOUNTER — Other Ambulatory Visit: Payer: Self-pay

## 2021-06-16 ENCOUNTER — Emergency Department (HOSPITAL_BASED_OUTPATIENT_CLINIC_OR_DEPARTMENT_OTHER)
Admission: EM | Admit: 2021-06-16 | Discharge: 2021-06-16 | Disposition: A | Discharge status: Home / Self Care | Payer: 59 | Attending: Emergency Medicine | Admitting: Emergency Medicine

## 2021-06-16 ENCOUNTER — Encounter (HOSPITAL_BASED_OUTPATIENT_CLINIC_OR_DEPARTMENT_OTHER): Payer: Self-pay | Admitting: *Deleted

## 2021-06-16 DIAGNOSIS — J45909 Unspecified asthma, uncomplicated: Secondary | ICD-10-CM | POA: Diagnosis not present

## 2021-06-16 DIAGNOSIS — Z853 Personal history of malignant neoplasm of breast: Secondary | ICD-10-CM | POA: Insufficient documentation

## 2021-06-16 DIAGNOSIS — H938X2 Other specified disorders of left ear: Secondary | ICD-10-CM | POA: Insufficient documentation

## 2021-06-16 DIAGNOSIS — H9202 Otalgia, left ear: Secondary | ICD-10-CM | POA: Diagnosis present

## 2021-06-16 NOTE — ED Notes (Signed)
ED Provider at bedside, Dr. Bero. 

## 2021-06-16 NOTE — Discharge Instructions (Addendum)
You were evaluated in the Emergency Department and after careful evaluation, we did not find any emergent condition requiring admission or further testing in the hospital.  Your exam/testing today was overall reassuring.  Recommend half-and-half hydrogen peroxide and water to continue to rinse out your ears as we discussed.  Please return to the Emergency Department if you experience any worsening of your condition.  Thank you for allowing [place] to be a part of your care.

## 2021-06-16 NOTE — ED Provider Notes (Signed)
[facility] Emergency Department Provider Note MRN:  [id number]  Arrival date & time: 06/16/21     Chief Complaint   Ear Fullness   History of Present Illness   Cynthia Ford is a 60 y.o. year-old female with a history of breast cancer presenting to the ED with chief complaint of ear fullness.  Patient was working out in her garden and felt a bug fly into her ear.  She rinsed it out with hydrogen peroxide and it felt better but still feels a bit full.  Here to make sure it is not still in there.  Denies any other injuries or complaints.  No pain, no fever.  Review of Systems  A problem-focused ROS was performed. Positive for ear fullness.  Patient denies fever.  Patient's Health History    Past Medical History:  Diagnosis Date   Allergy    Anemia    Anxiety    Asthma    Breast cancer ([facility]) 2016   Left Breast Cancer   GERD (gastroesophageal reflux disease)    Papilloma of left breast    Personal history of radiation therapy    PONV (postoperative nausea and vomiting)    Salivary gland infection 11/2019    Past Surgical History:  Procedure Laterality Date   BREAST BIOPSY Right 08/18/2020   BREAST EXCISIONAL BIOPSY Right 02/17/2018   BREAST EXCISIONAL BIOPSY Right 12/13/2020   BREAST LUMPECTOMY Left 01/2015   BREAST LUMPECTOMY WITH RADIOACTIVE SEED LOCALIZATION Left 02/14/2015   Procedure: BREAST LUMPECTOMY WITH RADIOACTIVE SEED LOCALIZATION;  Surgeon: Erroll Luna, MD;  Location: [place];  Service: General;  Laterality: Left;   BREAST LUMPECTOMY WITH RADIOACTIVE SEED LOCALIZATION Right 02/17/2018   Procedure: BREAST LUMPECTOMY WITH RADIOACTIVE SEED LOCALIZATION;  Surgeon: Erroll Luna, MD;  Location: [place];  Service: General;  Laterality: Right;   BREAST LUMPECTOMY WITH RADIOACTIVE SEED LOCALIZATION Right 12/13/2020   Procedure: RIGHT BREAST LUMPECTOMY WITH RADIOACTIVE SEED LOCALIZATION;  Surgeon:  Erroll Luna, MD;  Location: [place];  Service: General;  Laterality: Right;   BREAST SURGERY  2010   LEFT   CHOLECYSTECTOMY  2001   KNEE ARTHROSCOPY Left 1999/1993   NASAL PASSAGE  2000   NASAL SEPTUM SURGERY     TYMPANOSTOMY TUBE PLACEMENT  12/2018    Family History  Problem Relation Age of Onset   Breast cancer Other 26       niece   Prostate cancer Brother 67   Prostate cancer Brother 44   Prostate cancer Maternal Grandfather 24.   Colon polyps Mother    Colon polyps Sister    Rectal cancer Paternal Aunt    Colon cancer Maternal Grandmother    Esophageal cancer Neg Hx    Stomach cancer Neg Hx     Social History   Socioeconomic History   Marital status: Soil scientist    Spouse name: Not on file   Number of children: 0   Years of education: Not on file   Highest education level: Not on file  Occupational History   Occupation: Architect  Tobacco Use   Smoking status: Never   Smokeless tobacco: Never  Vaping Use   Vaping Use: Never used  Substance and Sexual Activity   Alcohol use: No    Alcohol/week: 0.0 standard drinks   Drug use: No   Sexual activity: Yes    Birth control/protection: Post-menopausal  Other Topics Concern   Not on file  Social History Narrative   Not on file   Social Determinants of Health   Financial Resource Strain: Not on file  Food Insecurity: Not on file  Transportation Needs: Not on file  Physical Activity: Not on file  Stress: Not on file  Social Connections: Not on file  Intimate Partner Violence: Not on file     Physical Exam   Vitals:   06/16/21 2041 06/16/21 2244  BP: (!) 127/91 (!) 123/92  Pulse: 88 70  Resp: 16 16  Temp: 98.4 F (36.9 C) 98.6 F (37 C)  SpO2: 98% 97%    CONSTITUTIONAL: Well-appearing, NAD NEURO:  Alert and oriented x 3, no focal deficits EYES:  eyes equal and reactive ENT/NECK:  no LAD, no JVD, bilateral tympanostomy tubes, mild cerumen burden bilaterally CARDIO:  Regular rate, well-perfused, normal S1 and S2 PULM:  CTAB no wheezing or rhonchi GI/GU:  normal bowel sounds, non-distended, non-tender MSK/SPINE:  No gross deformities, no edema SKIN:  no rash, atraumatic PSYCH:  Appropriate speech and behavior  *Additional and/or pertinent findings included in MDM below  Diagnostic and Interventional Summary    EKG Interpretation  Date/Time:    Ventricular Rate:    PR Interval:    QRS Duration:   QT Interval:    QTC Calculation:   R Axis:     Text Interpretation:          Labs Reviewed - No data to display  No orders to display    Medications - No data to display   Procedures  /  Critical Care Procedures  ED Course and Medical Decision Making  I have reviewed the triage vital signs, the nursing notes, and pertinent available records from the EMR.  Listed above are laboratory and imaging tests that I personally ordered, reviewed, and interpreted and then considered in my medical decision making (see below for details).  Nothing in the ear canal that I can see other than some cerumen, provided reassurance, appropriate for discharge.       Barth Kirks. Sedonia Small, [place] [email or web address]  Final Clinical Impressions(s) / ED Diagnoses     ICD-10-CM   1. Sensation of fullness in left ear  [phone]       ED Discharge Orders     None        Discharge Instructions Discussed with and Provided to Patient:    Discharge Instructions      You were evaluated in the Emergency Department and after careful evaluation, we did not find any emergent condition requiring admission or further testing in the hospital.  Your exam/testing today was overall reassuring.  Recommend half-and-half hydrogen peroxide and water to continue to rinse out your ears as we discussed.  Please return to the Emergency Department if you experience any worsening of your condition.  Thank you for allowing  [place] to be a part of your care.        Maudie Flakes, MD 06/16/21 [phone]

## 2021-06-16 NOTE — ED Triage Notes (Signed)
Pt reports she thought an insect flew in her ear while she was outside today so she flushed it with water. Reports feeling fullness in her left ear. Pt has tymp. tubes

## 2021-06-16 NOTE — ED Notes (Signed)
AMA process explained and MSE waiver signed by patient. 

## 2021-06-21 ENCOUNTER — Telehealth: Payer: Self-pay | Admitting: Hematology

## 2021-06-21 NOTE — Telephone Encounter (Signed)
Left message with rescheduled upcoming appointment due to provider's PAL. 

## 2021-07-11 ENCOUNTER — Ambulatory Visit: Payer: 59 | Admitting: Hematology

## 2021-07-11 ENCOUNTER — Other Ambulatory Visit: Payer: 59

## 2021-07-16 ENCOUNTER — Other Ambulatory Visit: Payer: Self-pay

## 2021-07-16 ENCOUNTER — Encounter: Payer: Self-pay | Admitting: Hematology

## 2021-07-16 ENCOUNTER — Inpatient Hospital Stay: Payer: 59 | Attending: Hematology

## 2021-07-16 ENCOUNTER — Inpatient Hospital Stay (HOSPITAL_BASED_OUTPATIENT_CLINIC_OR_DEPARTMENT_OTHER): Payer: 59 | Admitting: Hematology

## 2021-07-16 VITALS — BP 128/80 | HR 66 | Temp 98.6°F | Resp 18 | Ht 64.0 in | Wt 154.5 lb

## 2021-07-16 DIAGNOSIS — Z7951 Long term (current) use of inhaled steroids: Secondary | ICD-10-CM | POA: Insufficient documentation

## 2021-07-16 DIAGNOSIS — D0512 Intraductal carcinoma in situ of left breast: Secondary | ICD-10-CM

## 2021-07-16 DIAGNOSIS — R718 Other abnormality of red blood cells: Secondary | ICD-10-CM | POA: Diagnosis not present

## 2021-07-16 DIAGNOSIS — Z170 Estrogen receptor positive status [ER+]: Secondary | ICD-10-CM | POA: Insufficient documentation

## 2021-07-16 DIAGNOSIS — Z79899 Other long term (current) drug therapy: Secondary | ICD-10-CM | POA: Insufficient documentation

## 2021-07-16 DIAGNOSIS — Z853 Personal history of malignant neoplasm of breast: Secondary | ICD-10-CM | POA: Diagnosis not present

## 2021-07-16 DIAGNOSIS — E559 Vitamin D deficiency, unspecified: Secondary | ICD-10-CM | POA: Insufficient documentation

## 2021-07-16 DIAGNOSIS — J45909 Unspecified asthma, uncomplicated: Secondary | ICD-10-CM | POA: Insufficient documentation

## 2021-07-16 LAB — CBC WITH DIFFERENTIAL/PLATELET
Abs Immature Granulocytes: 0.02 10*3/uL (ref 0.00–0.07)
Basophils Absolute: 0.1 10*3/uL (ref 0.0–0.1)
Basophils Relative: 1 %
Eosinophils Absolute: 0.6 10*3/uL — ABNORMAL HIGH (ref 0.0–0.5)
Eosinophils Relative: 7 %
HCT: 35.4 % — ABNORMAL LOW (ref 36.0–46.0)
Hemoglobin: 12.4 g/dL (ref 12.0–15.0)
Immature Granulocytes: 0 %
Lymphocytes Relative: 22 %
Lymphs Abs: 2.1 10*3/uL (ref 0.7–4.0)
MCH: 26.7 pg (ref 26.0–34.0)
MCHC: 35 g/dL (ref 30.0–36.0)
MCV: 76.1 fL — ABNORMAL LOW (ref 80.0–100.0)
Monocytes Absolute: 0.8 10*3/uL (ref 0.1–1.0)
Monocytes Relative: 8 %
Neutro Abs: 5.8 10*3/uL (ref 1.7–7.7)
Neutrophils Relative %: 62 %
Platelets: 294 10*3/uL (ref 150–400)
RBC: 4.65 MIL/uL (ref 3.87–5.11)
RDW: 13.5 % (ref 11.5–15.5)
WBC: 9.4 10*3/uL (ref 4.0–10.5)
nRBC: 0 % (ref 0.0–0.2)

## 2021-07-16 LAB — COMPREHENSIVE METABOLIC PANEL
ALT: 11 U/L (ref 0–44)
AST: 14 U/L — ABNORMAL LOW (ref 15–41)
Albumin: 3.6 g/dL (ref 3.5–5.0)
Alkaline Phosphatase: 94 U/L (ref 38–126)
Anion gap: 9 (ref 5–15)
BUN: 13 mg/dL (ref 6–20)
CO2: 28 mmol/L (ref 22–32)
Calcium: 9.3 mg/dL (ref 8.9–10.3)
Chloride: 104 mmol/L (ref 98–111)
Creatinine, Ser: 0.87 mg/dL (ref 0.44–1.00)
GFR, Estimated: >60 mL/min (ref >60)
Glucose, Bld: 95 mg/dL (ref 70–99)
Potassium: 3.7 mmol/L (ref 3.5–5.1)
Sodium: 141 mmol/L (ref 135–145)
Total Bilirubin: 0.2 mg/dL — ABNORMAL LOW (ref 0.3–1.2)
Total Protein: 7.4 g/dL (ref 6.5–8.1)

## 2021-07-16 NOTE — Progress Notes (Signed)
[place]   Telephone:([phone]) [phone] [phone]   Clinic Follow up Note   Patient Care Team: [name], MD as PCP - General (Family Medicine)  Date of Service:  07/16/2021  CHIEF COMPLAINT: f/u of left breast DCIS  SUMMARY OF ONCOLOGIC HISTORY: Oncology History Overview Note  Breast cancer of lower-outer quadrant of left female breast   Staging form: Breast, AJCC 7th Edition     Clinical: Stage 0 (Tis (DCIS), N0, M0) - Unsigned      Ductal carcinoma in situ (DCIS) of left breast  12/19/2014 Pathology Results   Ductal papilloma    12/19/2014 Mammogram   2 new solid masses (85mm, 86mm) in the 6:00 position of the left breast.    02/14/2015 Initial Diagnosis   Breast cancer of lower-outer quadrant of left female breast    02/14/2015 Surgery   Left breast lumpectomy, negative margins.     02/14/2015 Pathologic Stage   Low-grade ductal carcinoma in situ involving intraductal papilloma, tumor 2 mm, ER 100%, PR 100% positive.    04/10/2015 - 05/26/2015 Radiation Therapy   adjuvant breast radiation     06/30/2015 - 05/2020 Anti-estrogen oral therapy   anastrozole 1mg  daily started on 06/30/2015. Due to right hip pain, Anastrozole was held 06/2019-08/2019. Stopped again in 05/2020 due to worsened arthritis.    12/26/2015 Imaging   MM DIAG BREAST TOMO BILATERAL 12/26/2015 IMPRESSION: No evidence of malignancy. Benign postsurgical scarring on the left. BI-RADS CATEGORY  2: Benign.    12/26/2016 Imaging   MM DIAG BREAST TOMO BILATERAL 12/26/2016 IMPRESSION: No findings worrisome for recurrent tumor or developing malignancy. Stable breast parenchymal pattern. BI-RADS CATEGORY  1: Negative.    01/01/2018 Mammogram   MM DIAG BREAST TOMO BILATERAL: IMPRESSION: 1. Possible subtle architectural distortion within the lower right breast, lower outer quadrant based on tomosynthesis slice position, best seen on MLO slice 61, less conspicuously seen on spot  compression MLO and true lateral images, without sonographic correlate. Stereotactic biopsy with 3D tomosynthesis guidance is recommended. 2. Benign right-sided subareolar and periareolar duct ectasia, the most likely source for patient's intermittent clear nipple discharge. 3. No evidence of malignancy within the left breast. Stable postsurgical changes within the left breast.    01/07/2018 Pathology Results   Diagnosis 01/07/2018 Breast, right, needle core biopsy, lower central - COMPLEX SCLEROSING LESION WITH USUAL DUCTAL HYPERPLASIA - PERIDUCTULAR CHRONIC INFLAMMATION - SEE COMMENT Microscopic Comment These results were called to [facility] on January 08, 2018.    02/17/2018 Surgery   BREAST LUMPECTOMY WITH RADIOACTIVE SEED LOCALIZATION by Dr. Brantley Stage 02/17/18     02/17/2018 Pathology Results   Diagnosis 02/17/18  Breast, lumpectomy, Right - RADIAL SCAR. - FIBROCYSTIC CHANGES. - HEALING BIOPSY SITE. - THERE IS NO EVIDENCE OF MALIGNANCY. - SEE COMMENT. Microscopic Comment The surgical resection margin(s) of the specimen were inked and microscopically evaluated.           CURRENT THERAPY:  Surveillance  INTERVAL HISTORY: Cynthia Ford is here for a follow up of DCIS. She was last seen by me on 07/10/20. She presents to the clinic by herself.  She unfortunately had an accident a few weeks ago, which resulted right wrist fracture and required surgery.  Her right hand and forearm still in a cast.  She otherwise doing well, still has some hot flashes, no other new pain, or other concerns.   All other systems were reviewed with the patient and are negative.  MEDICAL  HISTORY:  Past Medical History:  Diagnosis Date   Allergy    Anemia    Anxiety    Asthma    Breast cancer ([place]) 2016   Left Breast Cancer   GERD (gastroesophageal reflux disease)    Papilloma of left breast    Personal history of radiation therapy    PONV (postoperative nausea and  vomiting)    Salivary gland infection 11/2019    SURGICAL HISTORY: Past Surgical History:  Procedure Laterality Date   BREAST BIOPSY Right 08/18/2020   BREAST EXCISIONAL BIOPSY Right 02/17/2018   BREAST EXCISIONAL BIOPSY Right 12/13/2020   BREAST LUMPECTOMY Left 01/2015   BREAST LUMPECTOMY WITH RADIOACTIVE SEED LOCALIZATION Left 02/14/2015   Procedure: BREAST LUMPECTOMY WITH RADIOACTIVE SEED LOCALIZATION;  Surgeon: Erroll Luna, MD;  Location: [place];  Service: General;  Laterality: Left;   BREAST LUMPECTOMY WITH RADIOACTIVE SEED LOCALIZATION Right 02/17/2018   Procedure: BREAST LUMPECTOMY WITH RADIOACTIVE SEED LOCALIZATION;  Surgeon: Erroll Luna, MD;  Location: [place];  Service: General;  Laterality: Right;   BREAST LUMPECTOMY WITH RADIOACTIVE SEED LOCALIZATION Right 12/13/2020   Procedure: RIGHT BREAST LUMPECTOMY WITH RADIOACTIVE SEED LOCALIZATION;  Surgeon: Erroll Luna, MD;  Location: [place];  Service: General;  Laterality: Right;   BREAST SURGERY  2010   LEFT   CHOLECYSTECTOMY  2001   KNEE ARTHROSCOPY Left 1999/1993   NASAL PASSAGE  2000   NASAL SEPTUM SURGERY     TYMPANOSTOMY TUBE PLACEMENT  12/2018    I have reviewed the social history and family history with the patient and they are unchanged from previous note.  ALLERGIES:  has No Known Allergies.  MEDICATIONS:  Current Outpatient Medications  Medication Sig Dispense Refill   albuterol (PROVENTIL HFA;VENTOLIN HFA) 108 (90 BASE) MCG/ACT inhaler Inhale into the lungs every 6 (six) hours as needed for wheezing or shortness of breath.     Ascorbic Acid (VITA-C PO) Take 1 tablet by mouth daily.      CALCIUM PO Take 600 mg by mouth daily.      calcium-vitamin D (OSCAL WITH D) 500-200 MG-UNIT tablet Take 1 tablet by mouth daily.     cetirizine (ZYRTEC) 10 MG tablet Take 10 mg by mouth daily.     cholecalciferol (VITAMIN D) 1000 units tablet Take 2,000 Units by  mouth daily.     Fluticasone-Salmeterol (ADVAIR) 250-50 MCG/DOSE AEPB Inhale 1 puff into the lungs 2 (two) times daily.      HYDROcodone-acetaminophen (NORCO/VICODIN) 5-325 MG tablet Take 1 tablet by mouth every 6 (six) hours as needed for moderate pain. 15 tablet 0   ibuprofen (ADVIL) 800 MG tablet Take 1 tablet (800 mg total) by mouth every 8 (eight) hours as needed. 30 tablet 0   ibuprofen (ADVIL,MOTRIN) 800 MG tablet Take 1 tablet (800 mg total) by mouth every 8 (eight) hours as needed. 30 tablet 0   naproxen (NAPROSYN) 500 MG tablet Take 500 mg by mouth 2 (two) times daily as needed for mild pain.     No current facility-administered medications for this visit.    PHYSICAL EXAMINATION: ECOG PERFORMANCE STATUS: 1 - Symptomatic but completely ambulatory  Vitals:   07/16/21 1306  BP: 128/80  Pulse: 66  Resp: 18  Temp: 98.6 F (37 C)  SpO2: 100%   Filed Weights   07/16/21 1306  Weight: 154 lb 8 oz (70.1 kg)    GENERAL:alert, no distress and comfortable SKIN: skin color, texture, turgor are normal, no  rashes or significant lesions EYES: normal, Conjunctiva are pink and non-injected, sclera clear NECK: supple, thyroid normal size, non-tender, without nodularity LYMPH:  no palpable lymphadenopathy in the cervical, axillary  LUNGS: clear to auscultation and percussion with normal breathing effort HEART: regular rate & rhythm and no murmurs and no lower extremity edema ABDOMEN:abdomen soft, non-tender and normal bowel sounds Musculoskeletal:no cyanosis of digits and no clubbing  NEURO: alert & oriented x 3 with fluent speech, no focal motor/sensory deficits Breasts: Breast inspection showed them to be symmetrical with no nipple discharge. Palpation of the breasts and axilla revealed no obvious mass that I could appreciate.  Surgical scars are well-healed on both breast.   LABORATORY DATA:  I have reviewed the data as listed CBC Latest Ref Rng & Units 07/16/2021 12/11/2020  07/10/2020  WBC 4.0 - 10.5 K/uL 9.4 8.5 8.6  Hemoglobin 12.0 - 15.0 g/dL 12.4 12.9 13.1  Hematocrit 36.0 - 46.0 % 35.4(L) 38.2 37.4  Platelets 150 - 400 K/uL 294 318 298     CMP Latest Ref Rng & Units 07/16/2021 12/11/2020 07/10/2020  Glucose 70 - 99 mg/dL 95 84 99  BUN 6 - 20 mg/dL 13 12 9   Creatinine 0.44 - 1.00 mg/dL 0.87 0.94 0.96  Sodium 135 - 145 mmol/L 141 140 142  Potassium 3.5 - 5.1 mmol/L 3.7 4.1 4.1  Chloride 98 - 111 mmol/L 104 102 106  CO2 22 - 32 mmol/L 28 28 27   Calcium 8.9 - 10.3 mg/dL 9.3 9.6 9.7  Total Protein 6.5 - 8.1 g/dL 7.4 7.1 7.8  Total Bilirubin 0.3 - 1.2 mg/dL 0.2(L) 0.5 0.4  Alkaline Phos 38 - 126 U/L 94 86 116  AST 15 - 41 U/L 14(L) 26 16  ALT 0 - 44 U/L 11 34 17      RADIOGRAPHIC STUDIES: I have personally reviewed the radiological images as listed and agreed with the findings in the report. No results found.   ASSESSMENT & PLAN:  Cynthia Ford is a 60 y.o. female with   1. DCIS of left breast, ER+/PR+ -She was diagnosed in 01/2015. She is s/p left breast lumpectomy and adjuvant radiation. -She is on anastrozole for breast cancer prevention since 06/2015, plan for a total 5 years. She stopped in 05/2020 due to worsened arthritis.  -She has been followed for probably-benign left breast calcifications since 12/2019. Most recent mammogram 01/30/21 was stable. Annual follow up recommended. -She is clinically doing well. Lab reviewed, her CBC and CMP are within normal limits. Her physical exam was unremarkable. There is no clinical concern for new breast cancer. -Continue surveillance. Next mammogram in 01/2022, will do diagnostic mammogram per radiology recommendation, due to the calcification in left breast. -F/u in 1 year.  We will likely discharge her in a few years.   2. Right breast complex sclerosing lesion, resected  -01/01/18 mammogram showed subtle architectural distortion within the right breast, biopsy showed complex sclerosing lesion, no evidence  of malignancy. -01/07/18 biopsy showed complex sclerosing lesion with ductal hyperplasia -She had a right breast lumpectomy by Dr. Brantley Stage on 02/17/18 which showed radial scar, there was no evidence of malignancy.  -08/11/20 MRI showed 11 mm enhancement in right breast. 08/18/20 biopsy confirmed ductal papilloma. -She proceeded to right lumpectomy on 12/13/20 showing complex sclerosing lesion with intraductal papilloma, as well as chronic inflammation.   3. Genetic, Testing negative except unknown significance variant in [facility] 1 gene.   4. Bone health -Her bone density scan in August 2016  was normal -01/01/18 DEXA shows normal with lowest T-Score at forearm radius of -0.7. Repeat 01/19/20 was stable. -Continue calcium and vitamin D daily.     5. Right hip stiffness and pain -exacerbated by AI.  -Her PCP ordered an 2020 MRI of her right hip which found a small tear to the cartilage in her hip.  -resolved now    6. Vitamin D deficiency -She was found to have low vitamin D, treated with high-dose vitamin D by her primary care physician -She is currently on vitamin D 1000 units 1-2 tabs daily.    7. Hearing Loss, R>L -this is due to fluid collection in her ear. -Continue to f/u with ENT. She has hearing aids now.    8. RBC microcytosis -She has persistent low MCV in the mid 70s, no anemia -MCV 76 today (07/16/21). Stable.  -will order ferritin and hemoglobin electrophoresis to rule out thalassemia on next visit     Plan -Mammogram in 01/2022 -Screening breast MRI in late August 2022 -Lab and F/u in 1 year      No problem-specific Assessment & Plan notes found for this encounter.   Orders Placed This Encounter  Procedures   MR BREAST BILATERAL W WO CONTRAST INC CAD    Standing Status:   Future    Standing Expiration Date:   07/16/2022    Order Specific Question:   If indicated for the ordered procedure, I authorize the administration of contrast media per Radiology protocol    Answer:    Yes    Order Specific Question:   What is the patient's sedation requirement?    Answer:   No Sedation    Order Specific Question:   Does the patient have a pacemaker or implanted devices?    Answer:   No    Order Specific Question:   Radiology Contrast Protocol - do NOT remove file path    Answer:   [email or web address]    Order Specific Question:   Preferred imaging location?    Answer:   [facility] (table limit-550lbs)   MM DIAG BREAST TOMO BILATERAL    Standing Status:   Future    Standing Expiration Date:   07/16/2022    Order Specific Question:   Reason for Exam (SYMPTOM  OR DIAGNOSIS REQUIRED)    Answer:   screening    Order Specific Question:   Preferred imaging location?    Answer:   [facility]    Order Specific Question:   Is the patient pregnant?    Answer:   No   Ferritin    Standing Status:   Future    Standing Expiration Date:   07/16/2022   Hgb Fractionation Cascade    Standing Status:   Future    Standing Expiration Date:   07/16/2022   All questions were answered. The patient knows to call the clinic with any problems, questions or concerns. No barriers to learning was detected. The total time spent in the appointment was 30 minutes.     Truitt Merle, MD 07/16/2021   I, Wilburn Mylar, am acting as scribe for Truitt Merle, MD.   I have reviewed the above documentation for accuracy and completeness, and I agree with the above.

## 2021-09-07 ENCOUNTER — Ambulatory Visit
Admission: RE | Admit: 2021-09-07 | Discharge: 2021-09-07 | Disposition: A | Discharge status: Home / Self Care | Payer: 59 | Source: Ambulatory Visit | Attending: Hematology | Admitting: Hematology

## 2021-09-07 DIAGNOSIS — D0512 Intraductal carcinoma in situ of left breast: Secondary | ICD-10-CM

## 2021-09-07 MED ORDER — GADOBENATE DIMEGLUMINE 529 MG/ML IV SOLN
7.0000 mL | Freq: Once | INTRAVENOUS | Status: AC | PRN
  Administered 2021-09-07: 7 mL via INTRAVENOUS

## 2021-09-24 ENCOUNTER — Telehealth: Payer: Self-pay

## 2021-09-24 NOTE — Telephone Encounter (Signed)
This nurse made patient aware of MRI results per provider.  No further recommendations at this time.  Patient acknowledged understanding.  No concerns or questions at this time.  Patient knows to call clinic if there are any questions or concerns.

## 2021-09-24 NOTE — Telephone Encounter (Signed)
-----   Message from Truitt Merle, MD sent at 09/23/2021 11:18 AM EDT ----- My nurse,  Please let pt know that her screening breast MRI from early this month was negative, good news.  Thanks   Truitt Merle  09/23/2021

## 2022-01-31 ENCOUNTER — Ambulatory Visit
Admission: RE | Admit: 2022-01-31 | Discharge: 2022-01-31 | Disposition: A | Discharge status: Home / Self Care | Payer: 59 | Source: Ambulatory Visit | Attending: Hematology | Admitting: Hematology

## 2022-01-31 ENCOUNTER — Other Ambulatory Visit: Payer: Self-pay | Admitting: Hematology

## 2022-01-31 DIAGNOSIS — D0512 Intraductal carcinoma in situ of left breast: Secondary | ICD-10-CM

## 2022-02-12 ENCOUNTER — Encounter (HOSPITAL_COMMUNITY): Payer: Self-pay

## 2022-05-15 ENCOUNTER — Other Ambulatory Visit: Payer: Self-pay

## 2022-05-15 ENCOUNTER — Emergency Department (HOSPITAL_BASED_OUTPATIENT_CLINIC_OR_DEPARTMENT_OTHER)
Admission: EM | Admit: 2022-05-15 | Discharge: 2022-05-15 | Disposition: A | Discharge status: Home / Self Care | Payer: 59 | Attending: Emergency Medicine | Admitting: Emergency Medicine

## 2022-05-15 ENCOUNTER — Other Ambulatory Visit (HOSPITAL_BASED_OUTPATIENT_CLINIC_OR_DEPARTMENT_OTHER): Payer: Self-pay

## 2022-05-15 ENCOUNTER — Emergency Department (HOSPITAL_BASED_OUTPATIENT_CLINIC_OR_DEPARTMENT_OTHER): Payer: 59

## 2022-05-15 DIAGNOSIS — J45909 Unspecified asthma, uncomplicated: Secondary | ICD-10-CM | POA: Diagnosis present

## 2022-05-15 DIAGNOSIS — J45901 Unspecified asthma with (acute) exacerbation: Secondary | ICD-10-CM

## 2022-05-15 DIAGNOSIS — Z7951 Long term (current) use of inhaled steroids: Secondary | ICD-10-CM | POA: Diagnosis not present

## 2022-05-15 DIAGNOSIS — J4531 Mild persistent asthma with (acute) exacerbation: Secondary | ICD-10-CM | POA: Insufficient documentation

## 2022-05-15 MED ORDER — IPRATROPIUM BROMIDE HFA 17 MCG/ACT IN AERS
2.0000 | INHALATION_SPRAY | Freq: Once | RESPIRATORY_TRACT | Status: AC
  Administered 2022-05-15: 2 via RESPIRATORY_TRACT
  Filled 2022-05-15: qty 12.9

## 2022-05-15 MED ORDER — PREDNISONE 50 MG PO TABS
60.0000 mg | ORAL_TABLET | Freq: Once | ORAL | Status: AC
  Administered 2022-05-15: 60 mg via ORAL
  Filled 2022-05-15: qty 1

## 2022-05-15 MED ORDER — PREDNISONE 20 MG PO TABS
60.0000 mg | ORAL_TABLET | Freq: Every day | ORAL | 0 refills | Status: AC
  Filled 2022-05-15: qty 15, 5d supply, fill #0

## 2022-05-15 MED ORDER — ALBUTEROL SULFATE HFA 108 (90 BASE) MCG/ACT IN AERS
6.0000 | INHALATION_SPRAY | Freq: Once | RESPIRATORY_TRACT | Status: AC
  Administered 2022-05-15: 6 via RESPIRATORY_TRACT
  Filled 2022-05-15: qty 6.7

## 2022-05-15 NOTE — Discharge Instructions (Signed)
Continue use of your albuterol inhaler.  Take your next dose of steroid tomorrow.  There is no evidence of pneumonia on your chest x-ray.  Suspect that you will improve following steroids. ?

## 2022-05-15 NOTE — ED Triage Notes (Signed)
Pt reports asthma exacerbation x 1 week, worse within the past 2 weeks. Usually takes inhalers and hasn't been helping. Reports dry cough, no fever. ?

## 2022-05-15 NOTE — ED Provider Notes (Signed)
?[facility] ?Provider Note ? ? ?CSN: [id number] ?Arrival date & time: 05/15/22  1007 ? ?  ? ?History ? ?Chief Complaint  ?Patient presents with  ? Asthma  ? ? ?Cynthia Ford is a 61 y.o. female. ? ?The history is provided by the patient.  ?Asthma ?The current episode started more than 2 days ago. The problem occurs daily. The problem has not changed since onset.Pertinent negatives include no chest pain, no abdominal pain, no headaches and no shortness of breath. Nothing aggravates the symptoms. Relieved by: Inhaler. She has tried nothing for the symptoms. The treatment provided mild relief.  ? ?  ? ?Home Medications ?Prior to Admission medications   ?Medication Sig Start Date End Date Taking? Authorizing Provider  ?predniSONE (DELTASONE) 20 MG tablet Take 3 tablets (60 mg total) by mouth daily for 5 days. 05/15/22 05/20/22 Yes Mariamawit Depaoli, DO  ?albuterol (PROVENTIL HFA;VENTOLIN HFA) 108 (90 BASE) MCG/ACT inhaler Inhale into the lungs every 6 (six) hours as needed for wheezing or shortness of breath.    [provider]  ?Ascorbic Acid (VITA-C PO) Take 1 tablet by mouth daily.     [provider]  ?CALCIUM PO Take 600 mg by mouth daily.     [provider]  ?calcium-vitamin D (OSCAL WITH D) 500-200 MG-UNIT tablet Take 1 tablet by mouth daily.    [provider]  ?cetirizine (ZYRTEC) 10 MG tablet Take 10 mg by mouth daily.    [provider]  ?cholecalciferol (VITAMIN D) 1000 units tablet Take 2,000 Units by mouth daily.    [provider]  ?Fluticasone-Salmeterol (ADVAIR) 250-50 MCG/DOSE AEPB Inhale 1 puff into the lungs 2 (two) times daily.  07/01/18   [provider]  ?HYDROcodone-acetaminophen (NORCO/VICODIN) 5-325 MG tablet Take 1 tablet by mouth every 6 (six) hours as needed for moderate pain. 12/13/20   Erroll Luna, MD  ?ibuprofen (ADVIL) 800 MG tablet Take 1 tablet (800 mg total) by mouth every 8 (eight) hours as  needed. 12/13/20   Erroll Luna, MD  ?ibuprofen (ADVIL,MOTRIN) 800 MG tablet Take 1 tablet (800 mg total) by mouth every 8 (eight) hours as needed. 02/17/18   Cornett, [name], MD  ?naproxen (NAPROSYN) 500 MG tablet Take 500 mg by mouth 2 (two) times daily as needed for mild pain.    [provider]  ?   ? ?Allergies    ?Patient has no known allergies.   ? ?Review of Systems   ?Review of Systems  ?Respiratory:  Negative for shortness of breath.   ?Cardiovascular:  Negative for chest pain.  ?Gastrointestinal:  Negative for abdominal pain.  ?Neurological:  Negative for headaches.  ? ?Physical Exam ?Updated Vital Signs ?BP 125/79 (BP Location: Right Arm)   Pulse 73   Temp 97.9 ?F (36.6 ?C) (Oral)   Resp 17   Ht '5\' 4"'$  (1.626 m)   Wt 68.9 kg   SpO2 100%   BMI 26.09 kg/m?  ?Physical Exam ?Vitals and nursing note reviewed.  ?Constitutional:   ?   General: She is not in acute distress. ?   Appearance: She is well-developed.  ?HENT:  ?   Head: Normocephalic and atraumatic.  ?Eyes:  ?   Conjunctiva/sclera: Conjunctivae normal.  ?Cardiovascular:  ?   Rate and Rhythm: Normal rate and regular rhythm.  ?   Heart sounds: No murmur heard. ?Pulmonary:  ?   Effort: Pulmonary effort is normal. No respiratory distress.  ?   Breath sounds:  Wheezing present.  ?Abdominal:  ?   Palpations: Abdomen is soft.  ?   Tenderness: There is no abdominal tenderness.  ?Musculoskeletal:     ?   General: No swelling.  ?   Cervical back: Neck supple.  ?Skin: ?   General: Skin is warm and dry.  ?   Capillary Refill: Capillary refill takes less than 2 seconds.  ?Neurological:  ?   Mental Status: She is alert.  ?Psychiatric:     ?   Mood and Affect: Mood normal.  ? ? ?ED Results / Procedures / Treatments   ?Labs ?(all labs ordered are listed, but only abnormal results are displayed) ?Labs Reviewed - No data to display ? ?EKG ?EKG Interpretation ? ?Date/Time:  Wednesday May 15 2022 10:42:20 EDT ?Ventricular Rate:  68 ?PR Interval:  165 ?QRS  Duration: 83 ?QT Interval:  390 ?QTC Calculation: 415 ?R Axis:   60 ?Text Interpretation: Sinus rhythm Probable anteroseptal infarct, old Confirmed by Ronnald Nian, Taner Rzepka [id number]) on 05/15/2022 11:14:03 AM ? ?Radiology ?DG Chest Portable 1 View ? ?Result Date: 05/15/2022 ?CLINICAL DATA:  Short of breath EXAM: PORTABLE CHEST 1 VIEW COMPARISON:  09/20/2016 FINDINGS: The heart size and mediastinal contours are within normal limits. Both lungs are clear. The visualized skeletal structures are unremarkable. IMPRESSION: No active disease. Electronically Signed   By: Franchot Gallo M.D.   On: 05/15/2022 10:57   ? ?Procedures ?Procedures  ? ? ?Medications Ordered in ED ?Medications  ?albuterol (VENTOLIN HFA) 108 (90 Base) MCG/ACT inhaler 6 puff (6 puffs Inhalation Given 05/15/22 1044)  ?ipratropium (ATROVENT HFA) inhaler 2 puff (2 puffs Inhalation Given 05/15/22 1044)  ?predniSONE (DELTASONE) tablet 60 mg (60 mg Oral Given 05/15/22 1045)  ? ? ?ED Course/ Medical Decision Making/ A&P ?  ?                        ?Medical Decision Making ?Amount and/or Complexity of Data Reviewed ?Radiology: ordered. ? ?Risk ?Prescription drug management. ? ? ?Cynthia Ford is here with asthma exacerbation.  Normal vitals.  No fever.  No signs of respiratory distress.  Has been using her inhalers over the last week without much improvement.  Reports dry cough.  No fever.  She has mild expiratory wheezing on exam.  We will get chest x-ray and EKG.  Have no concern for ACS or PE.  I suspect that this is an asthma exacerbation and will evaluate for pneumonia.  Will give breathing treatment and steroids and reevaluate.  Overall this seems to be a mild asthma exacerbation.  No signs of volume overload on exam.  No concern for heart failure.  Patient has history of asthma and acid reflux. ? ?Per my review and interpretation of chest x-ray there is no pneumonia or pneumothorax.  EKG per my review and interpretation shows sinus rhythm, no ischemic changes.   Feeling better after breathing treatment.  Overall suspect mild asthma exacerbation.  Will prescribe steroids.  Recommend follow-up with primary care doctor.  Understands return precautions. ? ?This chart was dictated using voice recognition software.  Despite best efforts to proofread,  errors can occur which can change the documentation meaning.  ? ? ? ? ? ? ? ?Final Clinical Impression(s) / ED Diagnoses ?Final diagnoses:  ?Mild asthma with exacerbation, unspecified whether persistent  ? ? ?Rx / DC Orders ?ED Discharge Orders   ? ?      Ordered  ?  predniSONE (DELTASONE) 20 MG tablet  Daily       ? 05/15/22 1119  ? ?  ?  ? ?  ? ? ?  ?Lennice Sites, DO ?05/15/22 1121 ? ?

## 2022-07-12 ENCOUNTER — Other Ambulatory Visit: Payer: Self-pay

## 2022-07-12 DIAGNOSIS — D0512 Intraductal carcinoma in situ of left breast: Secondary | ICD-10-CM

## 2022-07-15 ENCOUNTER — Other Ambulatory Visit: Payer: Self-pay

## 2022-07-15 ENCOUNTER — Encounter: Payer: Self-pay | Admitting: Hematology

## 2022-07-15 ENCOUNTER — Inpatient Hospital Stay: Payer: 59 | Attending: Hematology | Admitting: Hematology

## 2022-07-15 ENCOUNTER — Inpatient Hospital Stay: Payer: 59

## 2022-07-15 VITALS — BP 112/85 | HR 68 | Temp 98.1°F | Resp 17 | Ht 64.0 in | Wt 156.4 lb

## 2022-07-15 DIAGNOSIS — J45909 Unspecified asthma, uncomplicated: Secondary | ICD-10-CM | POA: Insufficient documentation

## 2022-07-15 DIAGNOSIS — Z7951 Long term (current) use of inhaled steroids: Secondary | ICD-10-CM | POA: Insufficient documentation

## 2022-07-15 DIAGNOSIS — E559 Vitamin D deficiency, unspecified: Secondary | ICD-10-CM | POA: Insufficient documentation

## 2022-07-15 DIAGNOSIS — H9190 Unspecified hearing loss, unspecified ear: Secondary | ICD-10-CM | POA: Insufficient documentation

## 2022-07-15 DIAGNOSIS — R718 Other abnormality of red blood cells: Secondary | ICD-10-CM | POA: Insufficient documentation

## 2022-07-15 DIAGNOSIS — D0512 Intraductal carcinoma in situ of left breast: Secondary | ICD-10-CM

## 2022-07-15 DIAGNOSIS — Z79899 Other long term (current) drug therapy: Secondary | ICD-10-CM | POA: Diagnosis not present

## 2022-07-15 DIAGNOSIS — Z853 Personal history of malignant neoplasm of breast: Secondary | ICD-10-CM | POA: Insufficient documentation

## 2022-07-15 LAB — CMP (CANCER CENTER ONLY)
ALT: 14 U/L (ref 0–44)
AST: 16 U/L (ref 15–41)
Albumin: 4.5 g/dL (ref 3.5–5.0)
Alkaline Phosphatase: 98 U/L (ref 38–126)
Anion gap: 5 (ref 5–15)
BUN: 14 mg/dL (ref 6–20)
CO2: 31 mmol/L (ref 22–32)
Calcium: 10 mg/dL (ref 8.9–10.3)
Chloride: 103 mmol/L (ref 98–111)
Creatinine: 0.84 mg/dL (ref 0.44–1.00)
GFR, Estimated: >60 mL/min (ref >60)
Glucose, Bld: 97 mg/dL (ref 70–99)
Potassium: 4.1 mmol/L (ref 3.5–5.1)
Sodium: 139 mmol/L (ref 135–145)
Total Bilirubin: 0.3 mg/dL (ref 0.3–1.2)
Total Protein: 7.9 g/dL (ref 6.5–8.1)

## 2022-07-15 LAB — CBC WITH DIFFERENTIAL (CANCER CENTER ONLY)
Abs Immature Granulocytes: 0.03 10*3/uL (ref 0.00–0.07)
Basophils Absolute: 0.1 10*3/uL (ref 0.0–0.1)
Basophils Relative: 1 %
Eosinophils Absolute: 1 10*3/uL — ABNORMAL HIGH (ref 0.0–0.5)
Eosinophils Relative: 10 %
HCT: 39 % (ref 36.0–46.0)
Hemoglobin: 13.8 g/dL (ref 12.0–15.0)
Immature Granulocytes: 0 %
Lymphocytes Relative: 18 %
Lymphs Abs: 1.7 10*3/uL (ref 0.7–4.0)
MCH: 27.2 pg (ref 26.0–34.0)
MCHC: 35.4 g/dL (ref 30.0–36.0)
MCV: 76.8 fL — ABNORMAL LOW (ref 80.0–100.0)
Monocytes Absolute: 0.7 10*3/uL (ref 0.1–1.0)
Monocytes Relative: 7 %
Neutro Abs: 6.4 10*3/uL (ref 1.7–7.7)
Neutrophils Relative %: 64 %
Platelet Count: 326 10*3/uL (ref 150–400)
RBC: 5.08 MIL/uL (ref 3.87–5.11)
RDW: 13.4 % (ref 11.5–15.5)
WBC Count: 9.9 10*3/uL (ref 4.0–10.5)
nRBC: 0 % (ref 0.0–0.2)

## 2022-07-15 LAB — FERRITIN: Ferritin: 153 ng/mL (ref 11–307)

## 2022-07-15 NOTE — Progress Notes (Signed)
[facility]   Telephone:(336) [phone] Fax:(336) [phone]   Clinic Follow up Note   Patient Care Team: [name], MD as PCP - General (Family Medicine)  Date of Service:  07/15/2022  CHIEF COMPLAINT: f/u of left breast DCIS  CURRENT THERAPY:  Surveillance  ASSESSMENT & PLAN:  Cynthia Ford is a 61 y.o. post-menopausal female with   1. DCIS of left breast, ER+/PR+ - diagnosed in 01/2015. S/p left breast lumpectomy, adjuvant radiation, and anastrozole 06/2015 - 05/2020 -most recent breast MRI 09/07/21 was negative, and mammogram with right [place] 01/31/22 showed benign postoperative calcifications and a small right breast cyst. -She is clinically doing well. Lab reviewed, overall WNL. Her physical exam was unremarkable. There is no clinical concern for new breast cancer. -Continue surveillance. Next mammogram in 01/2023. -I discussed releasing her to f/u with her PCP; she is agreeable. F/u with me is open.  2. RBC microcytosis -She has persistent low MCV in the mid 70s, no anemia -MCV stable at 76.8 today (07/15/22).   -ferritin and hemoglobin electrophoresis today are pending, will call her with the results.   3. Right breast complex sclerosing lesion, resected  -s/p lumpectomies 02/17/18 and 12/13/20 for complex sclerosing lesion with intraductal papilloma. -incidental 0.5 cm benign cyst at 6:30 on right [place] from 01/31/22. No f/u needed.   4. Genetic, Testing negative except unknown significance variant in [facility] 1 gene.   5. Bone health -Her bone density scan in August 2016 was normal -01/01/18 DEXA shows normal with lowest T-Score at forearm radius of -0.7. Repeat 01/19/20 was stable. -Continue calcium and vitamin D daily.     6. Health Maintenance, Comorbidities: Vitamin D deficiency, Hearing loss R>L -she continues vitamin D 1000 units 1-2 tabs daily.  -hearing loss due to fluid collection, uses hearing aids. -colonoscopy UTD, recall in 11/2023     Plan -continue  surveillance, next mammogram 01/2023 -f/u open   No problem-specific Assessment & Plan notes found for this encounter.   SUMMARY OF ONCOLOGIC HISTORY: Oncology History Overview Note  Breast cancer of lower-outer quadrant of left female breast   Staging form: Breast, AJCC 7th Edition     Clinical: Stage 0 (Tis (DCIS), N0, M0) - Unsigned     Ductal carcinoma in situ (DCIS) of left breast  12/19/2014 Pathology Results   Ductal papilloma   12/19/2014 Mammogram   2 new solid masses (70m, 635m in the 6:00 position of the left breast.   02/14/2015 Initial Diagnosis   Breast cancer of lower-outer quadrant of left female breast   02/14/2015 Surgery   Left breast lumpectomy, negative margins.    02/14/2015 Pathologic Stage   Low-grade ductal carcinoma in situ involving intraductal papilloma, tumor 2 mm, ER 100%, PR 100% positive.   04/10/2015 - 05/26/2015 Radiation Therapy   adjuvant breast radiation    06/30/2015 - 05/2020 Anti-estrogen oral therapy   anastrozole '1mg'$  daily started on 06/30/2015. Due to right hip pain, Anastrozole was held 06/2019-08/2019. Stopped again in 05/2020 due to worsened arthritis.    12/26/2015 Imaging   MM DIAG BREAST TOMO BILATERAL 12/26/2015 IMPRESSION: No evidence of malignancy. Benign postsurgical scarring on the left. BI-RADS CATEGORY  2: Benign.   12/26/2016 Imaging   MM DIAG BREAST TOMO BILATERAL 12/26/2016 IMPRESSION: No findings worrisome for recurrent tumor or developing malignancy. Stable breast parenchymal pattern. BI-RADS CATEGORY  1: Negative.   01/01/2018 Mammogram   MM DIAG BREAST TOMO BILATERAL: IMPRESSION: 1. Possible subtle architectural distortion within the lower  right breast, lower outer quadrant based on tomosynthesis slice position, best seen on MLO slice 61, less conspicuously seen on spot compression MLO and true lateral images, without sonographic correlate. Stereotactic biopsy with 3D tomosynthesis guidance is recommended. 2. Benign  right-sided subareolar and periareolar duct ectasia, the most likely source for patient's intermittent clear nipple discharge. 3. No evidence of malignancy within the left breast. Stable postsurgical changes within the left breast.   01/07/2018 Pathology Results   Diagnosis 01/07/2018 Breast, right, needle core biopsy, lower central - COMPLEX SCLEROSING LESION WITH USUAL DUCTAL HYPERPLASIA - PERIDUCTULAR CHRONIC INFLAMMATION - SEE COMMENT Microscopic Comment These results were called to [facility] on January 08, 2018.   02/17/2018 Surgery   BREAST LUMPECTOMY WITH RADIOACTIVE SEED LOCALIZATION by Dr. Brantley Stage 02/17/18    02/17/2018 Pathology Results   Diagnosis 02/17/18  Breast, lumpectomy, Right - RADIAL SCAR. - FIBROCYSTIC CHANGES. - HEALING BIOPSY SITE. - THERE IS NO EVIDENCE OF MALIGNANCY. - SEE COMMENT. Microscopic Comment The surgical resection margin(s) of the specimen were inked and microscopically evaluated.          INTERVAL HISTORY:  Cynthia Ford is here for a follow up of DCIS. She was last seen by me one year ago. She presents to the clinic alone. She reports she is doing well overall, denies any new breast concerns. She reports some mild constipation, which causes some cramping.   All other systems were reviewed with the patient and are negative.  MEDICAL HISTORY:  Past Medical History:  Diagnosis Date   Allergy    Anemia    Anxiety    Asthma    Breast cancer ([place]) 2016   Left Breast Cancer   GERD (gastroesophageal reflux disease)    Papilloma of left breast    Personal history of radiation therapy    PONV (postoperative nausea and vomiting)    Salivary gland infection 11/2019    SURGICAL HISTORY: Past Surgical History:  Procedure Laterality Date   BREAST BIOPSY Right 08/18/2020   BREAST EXCISIONAL BIOPSY Right 02/17/2018   BREAST EXCISIONAL BIOPSY Right 12/13/2020   BREAST LUMPECTOMY Left 01/2015   BREAST LUMPECTOMY WITH  RADIOACTIVE SEED LOCALIZATION Left 02/14/2015   Procedure: BREAST LUMPECTOMY WITH RADIOACTIVE SEED LOCALIZATION;  Surgeon: Erroll Luna, MD;  Location: [facility];  Service: General;  Laterality: Left;   BREAST LUMPECTOMY WITH RADIOACTIVE SEED LOCALIZATION Right 02/17/2018   Procedure: BREAST LUMPECTOMY WITH RADIOACTIVE SEED LOCALIZATION;  Surgeon: Erroll Luna, MD;  Location: [facility];  Service: General;  Laterality: Right;   BREAST LUMPECTOMY WITH RADIOACTIVE SEED LOCALIZATION Right 12/13/2020   Procedure: RIGHT BREAST LUMPECTOMY WITH RADIOACTIVE SEED LOCALIZATION;  Surgeon: Erroll Luna, MD;  Location: [facility];  Service: General;  Laterality: Right;   BREAST SURGERY  2010   LEFT   CHOLECYSTECTOMY  2001   KNEE ARTHROSCOPY Left 1999/1993   NASAL PASSAGE  2000   NASAL SEPTUM SURGERY     TYMPANOSTOMY TUBE PLACEMENT  12/2018    I have reviewed the social history and family history with the patient and they are unchanged from previous note.  ALLERGIES:  has No Known Allergies.  MEDICATIONS:  Current Outpatient Medications  Medication Sig Dispense Refill   albuterol (PROVENTIL HFA;VENTOLIN HFA) 108 (90 BASE) MCG/ACT inhaler Inhale into the lungs every 6 (six) hours as needed for wheezing or shortness of breath.     Ascorbic Acid (VITA-C PO) Take 1 tablet by mouth daily.  CALCIUM PO Take 600 mg by mouth daily.      calcium-vitamin D (OSCAL WITH D) 500-200 MG-UNIT tablet Take 1 tablet by mouth daily.     cetirizine (ZYRTEC) 10 MG tablet Take 10 mg by mouth daily.     cholecalciferol (VITAMIN D) 1000 units tablet Take 2,000 Units by mouth daily.     Fluticasone-Salmeterol (ADVAIR) 250-50 MCG/DOSE AEPB Inhale 1 puff into the lungs 2 (two) times daily.      HYDROcodone-acetaminophen (NORCO/VICODIN) 5-325 MG tablet Take 1 tablet by mouth every 6 (six) hours as needed for moderate pain. 15 tablet 0   ibuprofen (ADVIL) 800 MG tablet Take 1  tablet (800 mg total) by mouth every 8 (eight) hours as needed. 30 tablet 0   ibuprofen (ADVIL,MOTRIN) 800 MG tablet Take 1 tablet (800 mg total) by mouth every 8 (eight) hours as needed. 30 tablet 0   naproxen (NAPROSYN) 500 MG tablet Take 500 mg by mouth 2 (two) times daily as needed for mild pain.     No current facility-administered medications for this visit.    PHYSICAL EXAMINATION: ECOG PERFORMANCE STATUS: 1 - Symptomatic but completely ambulatory  Vitals:   07/15/22 1421  BP: 112/85  Pulse: 68  Resp: 17  Temp: 98.1 F (36.7 C)  SpO2: 97%   Wt Readings from Last 3 Encounters:  07/15/22 156 lb 6.4 oz (70.9 kg)  05/15/22 152 lb (68.9 kg)  07/16/21 154 lb 8 oz (70.1 kg)     GENERAL:alert, no distress and comfortable SKIN: skin color, texture, turgor are normal, no rashes or significant lesions EYES: normal, Conjunctiva are pink and non-injected, sclera clear  NECK: supple, thyroid normal size, non-tender, without nodularity LYMPH:  no palpable lymphadenopathy in the cervical, axillary LUNGS: clear to auscultation and percussion with normal breathing effort HEART: regular rate & rhythm and no murmurs and no lower extremity edema ABDOMEN: soft, (+) mild tenderness to palpation Musculoskeletal:no cyanosis of digits and no clubbing  NEURO: alert & oriented x 3 with fluent speech, no focal motor/sensory deficits BREAST: No palpable mass, nodules or adenopathy bilaterally. Breast exam benign.   LABORATORY DATA:  I have reviewed the data as listed    Latest Ref Rng & Units 07/15/2022    2:00 PM 07/16/2021   12:39 PM 12/11/2020   12:18 PM  CBC  WBC 4.0 - 10.5 K/uL 9.9  9.4  8.5   Hemoglobin 12.0 - 15.0 g/dL 13.8  12.4  12.9   Hematocrit 36.0 - 46.0 % 39.0  35.4  38.2   Platelets 150 - 400 K/uL 326  294  318         Latest Ref Rng & Units 07/15/2022    2:00 PM 07/16/2021   12:39 PM 12/11/2020   12:18 PM  CMP  Glucose 70 - 99 mg/dL 97  95  84   BUN 6 - 20 mg/dL '14  13   12   '$ Creatinine 0.44 - 1.00 mg/dL 0.84  0.87  0.94   Sodium 135 - 145 mmol/L 139  141  140   Potassium 3.5 - 5.1 mmol/L 4.1  3.7  4.1   Chloride 98 - 111 mmol/L 103  104  102   CO2 22 - 32 mmol/L '31  28  28   '$ Calcium 8.9 - 10.3 mg/dL 10.0  9.3  9.6   Total Protein 6.5 - 8.1 g/dL 7.9  7.4  7.1   Total Bilirubin 0.3 - 1.2 mg/dL 0.3  0.2  0.5   Alkaline Phos 38 - 126 U/L 98  94  86   AST 15 - 41 U/L '16  14  26   '$ ALT 0 - 44 U/L 14  11  34       RADIOGRAPHIC STUDIES: I have personally reviewed the radiological images as listed and agreed with the findings in the report. No results found.    No orders of the defined types were placed in this encounter.  All questions were answered. The patient knows to call the clinic with any problems, questions or concerns. No barriers to learning was detected. The total time spent in the appointment was 25 minutes.     Truitt Merle, MD 07/15/2022   I, Wilburn Mylar, am acting as scribe for Truitt Merle, MD.   I have reviewed the above documentation for accuracy and completeness, and I agree with the above.

## 2022-07-17 LAB — HGB FRACTIONATION CASCADE

## 2022-07-17 LAB — HGB FRACTIONATION BY HPLC
Hgb A2: 3.1 % (ref 1.8–3.2)
Hgb A: 59.1 % — ABNORMAL LOW (ref 96.4–98.8)
Hgb C: 37.8 % — ABNORMAL HIGH
Hgb E: 0 %
Hgb F: 0 % (ref 0.0–2.0)
Hgb S: 0 %
Hgb Variant: 0 %

## 2022-09-04 ENCOUNTER — Other Ambulatory Visit: Payer: Self-pay | Admitting: Family Medicine

## 2022-09-04 DIAGNOSIS — R1032 Left lower quadrant pain: Secondary | ICD-10-CM

## 2022-09-04 DIAGNOSIS — R3129 Other microscopic hematuria: Secondary | ICD-10-CM

## 2022-09-25 ENCOUNTER — Ambulatory Visit (INDEPENDENT_AMBULATORY_CARE_PROVIDER_SITE_OTHER): Payer: 59 | Admitting: Pulmonary Disease

## 2022-09-25 ENCOUNTER — Encounter: Payer: Self-pay | Admitting: Pulmonary Disease

## 2022-09-25 VITALS — BP 116/72 | HR 64 | Ht 64.0 in | Wt 152.4 lb

## 2022-09-25 DIAGNOSIS — J4540 Moderate persistent asthma, uncomplicated: Secondary | ICD-10-CM

## 2022-09-25 LAB — CBC WITH DIFFERENTIAL/PLATELET
Basophils Absolute: 0.1 10*3/uL (ref 0.0–0.1)
Basophils Relative: 0.8 % (ref 0.0–3.0)
Eosinophils Absolute: 0.9 10*3/uL — ABNORMAL HIGH (ref 0.0–0.7)
Eosinophils Relative: 10.4 % — ABNORMAL HIGH (ref 0.0–5.0)
HCT: 38.5 % (ref 36.0–46.0)
Hemoglobin: 13.2 g/dL (ref 12.0–15.0)
Lymphocytes Relative: 19.1 % (ref 12.0–46.0)
Lymphs Abs: 1.6 10*3/uL (ref 0.7–4.0)
MCHC: 34.3 g/dL (ref 30.0–36.0)
MCV: 78.8 fl (ref 78.0–100.0)
Monocytes Absolute: 0.6 10*3/uL (ref 0.1–1.0)
Monocytes Relative: 7.4 % (ref 3.0–12.0)
Neutro Abs: 5.4 10*3/uL (ref 1.4–7.7)
Neutrophils Relative %: 62.3 % (ref 43.0–77.0)
Platelets: 286 10*3/uL (ref 150.0–400.0)
RBC: 4.88 Mil/uL (ref 3.87–5.11)
RDW: 14 % (ref 11.5–15.5)
WBC: 8.6 10*3/uL (ref 4.0–10.5)

## 2022-09-25 MED ORDER — BUDESONIDE-FORMOTEROL FUMARATE 160-4.5 MCG/ACT IN AERO: 2.0000 | INHALATION_SPRAY | Freq: Two times a day (BID) | RESPIRATORY_TRACT | 6 refills | Status: DC

## 2022-09-25 NOTE — Progress Notes (Signed)
Synopsis: Referred in September 2023 for asthma by [name], MD  Subjective:   PATIENT ID: Cynthia Ford GENDER: female DOB: 01/05/1961, MRN: [id number]  HPI  Chief Complaint  Patient presents with   Consult    Referred by PCP for asthma. States she was diagnosed back in the early 00s. States she has had 2 exacerbations within the last year.    Cynthia Ford is a 61 year old woman, never smoker with history of left breast cancer, GERD and asthma who is referred to pulmonary clinic for evaluation of asthma.   She reports being diagnosed with asthma in the early 2000s. She reports significant seasonal allergies and allergies to pet dander. She has 2 dogs at home. Spring and fall seasons are difficult for her breathing. She is currently using symbicort 80-4.28mg 2 puffs twice daily and singulair daily. She reports improvement in symptoms since starting singulair a few months ago. She is taking zyrtec for allergies. She wakes up 2 nights per week due to cough, wheezing or dyspnea.   She had no issues in childhood with her breathing. She played highschool sports. She was exposed to second hand smoke from her mother. Her mother had asthma/copd. 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 Her breathing worsened 5 years into her work. 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 She retired in 2020.  She has carpet in the bedrooms of her homes. The dogs are in her bedroom and stay in there at night.   Past Medical History:  Diagnosis Date   Allergy    Anemia    Anxiety    Asthma    Breast cancer ([place] 2016   Left Breast Cancer   GERD (gastroesophageal reflux disease)    Papilloma of left breast    Personal history of radiation therapy    PONV (postoperative nausea and vomiting)    Salivary gland infection 11/2019     Family History  Problem Relation Age of  Onset   Breast cancer Other 349      niece   Prostate cancer Brother 528  Prostate cancer Brother 526  Prostate cancer Maternal Grandfather [phone]   Colon polyps Mother    Colon polyps Sister    Rectal cancer Paternal Aunt    Colon cancer Maternal Grandmother    Esophageal cancer Neg Hx    Stomach cancer Neg Hx      Social History   Socioeconomic History   Marital status: DSoil scientist   Spouse name: Not on file   Number of children: 0   Years of education: Not on file   Highest education level: Not on file  Occupational History   Occupation: QArchitect Tobacco Use   Smoking status: Never   Smokeless tobacco: Never  Vaping Use   Vaping Use: Never used  Substance and Sexual Activity   Alcohol use: No    Alcohol/week: 0.0 standard drinks of alcohol   Drug use: No   Sexual activity: Yes    Birth control/protection: Post-menopausal  Other Topics Concern   Not on file  Social History Narrative   Not on file   Social Determinants of Health   Financial Resource Strain: Not on file  Food Insecurity: Not on file  Transportation Needs: Not on file  Physical Activity: Not on file  Stress: Not on file  Social Connections: Not on file  Intimate Partner Violence: Not on file     No Known Allergies   Outpatient Medications Prior to Visit  Medication Sig Dispense Refill   albuterol (PROVENTIL HFA;VENTOLIN HFA) 108 (90 BASE) MCG/ACT inhaler Inhale into the lungs every 6 (six) hours as needed for wheezing or shortness of breath.     Ascorbic Acid (VITA-C PO) Take 1 tablet by mouth daily.      CALCIUM PO Take 600 mg by mouth daily.      cetirizine (ZYRTEC) 10 MG tablet Take 10 mg by mouth daily.     cholecalciferol (VITAMIN D) 1000 units tablet Take 2,000 Units by mouth daily.     fluticasone (FLONASE) 50 MCG/ACT nasal spray Place 2 sprays into both nostrils every morning.     ibuprofen (ADVIL,MOTRIN) 800 MG tablet Take 1 tablet (800 mg total) by mouth every 8 (eight)  hours as needed. 30 tablet 0   montelukast (SINGULAIR) 10 MG tablet Take 10 mg by mouth at bedtime.     budesonide-formoterol (SYMBICORT) 80-4.5 MCG/ACT inhaler Inhale 2 puffs into the lungs 2 (two) times daily.     calcium-vitamin D (OSCAL WITH D) 500-200 MG-UNIT tablet Take 1 tablet by mouth daily.     Fluticasone-Salmeterol (ADVAIR) 250-50 MCG/DOSE AEPB Inhale 1 puff into the lungs 2 (two) times daily.      HYDROcodone-acetaminophen (NORCO/VICODIN) 5-325 MG tablet Take 1 tablet by mouth every 6 (six) hours as needed for moderate pain. 15 tablet 0   ibuprofen (ADVIL) 800 MG tablet Take 1 tablet (800 mg total) by mouth every 8 (eight) hours as needed. 30 tablet 0   naproxen (NAPROSYN) 500 MG tablet Take 500 mg by mouth 2 (two) times daily as needed for mild pain.     No facility-administered medications prior to visit.   Review of Systems  Constitutional:  Negative for chills, fever, malaise/fatigue and weight loss.  HENT:  Positive for congestion. Negative for sinus pain and sore throat.   Eyes: Negative.   Respiratory:  Positive for cough, shortness of breath and wheezing. Negative for hemoptysis and sputum production.   Cardiovascular:  Negative for chest pain, palpitations, orthopnea, claudication and leg swelling.  Gastrointestinal:  Positive for abdominal pain. Negative for heartburn, nausea and vomiting.  Genitourinary: Negative.   Musculoskeletal:  Positive for joint pain. Negative for myalgias.  Skin:  Negative for rash.  Neurological:  Negative for weakness.  Endo/Heme/Allergies:  Positive for environmental allergies.  Psychiatric/Behavioral: Negative.     Objective:   Vitals:   09/25/22 1032  BP: 116/72  Pulse: 64  SpO2: 100%  Weight: 152 lb 6.4 oz (69.1 kg)  Height: '5\' 4"'$  (1.626 m)     Physical Exam Constitutional:      General: She is not in acute distress.    Appearance: She is not ill-appearing.  HENT:     Head: Normocephalic and atraumatic.  Eyes:      General: No scleral icterus.    Conjunctiva/sclera: Conjunctivae normal.     Pupils: Pupils are equal, round, and reactive to light.  Cardiovascular:     Rate and Rhythm: Normal rate and regular rhythm.     Pulses: Normal pulses.     Heart sounds: Normal heart sounds. No murmur heard. Pulmonary:     Effort: Pulmonary effort is normal.     Breath sounds: Wheezing (mild, RUL) present. No rhonchi or rales.  Abdominal:     General: Bowel  sounds are normal.     Palpations: Abdomen is soft.  Musculoskeletal:     Right lower leg: No edema.     Left lower leg: No edema.  Lymphadenopathy:     Cervical: No cervical adenopathy.  Skin:    General: Skin is warm and dry.  Neurological:     General: No focal deficit present.     Mental Status: She is alert.  Psychiatric:        Mood and Affect: Mood normal.        Behavior: Behavior normal.        Thought Content: Thought content normal.        Judgment: Judgment normal.    CBC    Component Value Date/Time   WBC 9.9 07/15/2022 1400   WBC 9.4 07/16/2021 1239   RBC 5.08 07/15/2022 1400   HGB 13.8 07/15/2022 1400   HGB 13.3 07/08/2017 1225   HCT 39.0 07/15/2022 1400   HCT 38.3 07/08/2017 1225   PLT 326 07/15/2022 1400   PLT 293 07/08/2017 1225   MCV 76.8 (L) 07/15/2022 1400   MCV 78.4 (L) 07/08/2017 1225   MCH 27.2 07/15/2022 1400   MCHC 35.4 07/15/2022 1400   RDW 13.4 07/15/2022 1400   RDW 13.6 07/08/2017 1225   LYMPHSABS 1.7 07/15/2022 1400   LYMPHSABS 1.6 07/08/2017 1225   MONOABS 0.7 07/15/2022 1400   MONOABS 0.6 07/08/2017 1225   EOSABS 1.0 (H) 07/15/2022 1400   EOSABS 0.7 (H) 07/08/2017 1225   BASOSABS 0.1 07/15/2022 1400   BASOSABS 0.0 07/08/2017 1225      Latest Ref Rng & Units 07/15/2022    2:00 PM 07/16/2021   12:39 PM 12/11/2020   12:18 PM  BMP  Glucose 70 - 99 mg/dL 97  95  84   BUN 6 - 20 mg/dL '14  13  12   '$ Creatinine 0.44 - 1.00 mg/dL 0.84  0.87  0.94   Sodium 135 - 145 mmol/L 139  141  140   Potassium 3.5  - 5.1 mmol/L 4.1  3.7  4.1   Chloride 98 - 111 mmol/L 103  104  102   CO2 22 - 32 mmol/L '31  28  28   '$ Calcium 8.9 - 10.3 mg/dL 10.0  9.3  9.6    Chest imaging: CXR 05/15/22 The heart size and mediastinal contours are within normal limits. Both lungs are clear. The visualized skeletal structures are unremarkable.  PFT:     No data to display          Labs:  Path:  Echo:  Heart Catheterization:  Assessment & Plan:   Moderate persistent asthma without complication - Plan: Pulmonary Function Test, Resp Allergy Profile Regn2DC [place] [facility] [place] [facility], CBC with Differential, budesonide-formoterol (SYMBICORT) 160-4.5 MCG/ACT inhaler, CBC with Differential, Resp Allergy Profile Regn2DC DE MD [place] [facility]  Discussion: Cynthia Ford is a 61 year old woman, never smoker with history of left breast cancer, GERD and asthma who is referred to pulmonary clinic for evaluation of asthma.   She has moderate persistent asthma with significant allergies.  We will increase her Symbicort to 160-4.5 mcg 2 puffs twice daily.  She can continue as needed albuterol.  She is to continue montelukast 10 mg daily.  We will check CBC with differential, regional allergy panel for IgE level and pulmonary function test.  I instructed her to keep her bedroom free of her dogs as well as to remove her carpet and place a  hard surfaced flooring in her bedroom.  I have also recommended an air purifier for her bedroom.  She is to follow-up in 3 months.  Freda Jackson, MD [facility] Office: [phone]   Current Outpatient Medications:    albuterol (PROVENTIL HFA;VENTOLIN HFA) 108 (90 BASE) MCG/ACT inhaler, Inhale into the lungs every 6 (six) hours as needed for wheezing or shortness of breath., Disp: , Rfl:    Ascorbic Acid (VITA-C PO), Take 1 tablet by mouth daily. , Disp: , Rfl:    budesonide-formoterol (SYMBICORT) 160-4.5 MCG/ACT inhaler, Inhale 2 puffs into the lungs 2 (two) times daily., Disp: 1  each, Rfl: 6   CALCIUM PO, Take 600 mg by mouth daily. , Disp: , Rfl:    cetirizine (ZYRTEC) 10 MG tablet, Take 10 mg by mouth daily., Disp: , Rfl:    cholecalciferol (VITAMIN D) 1000 units tablet, Take 2,000 Units by mouth daily., Disp: , Rfl:    fluticasone (FLONASE) 50 MCG/ACT nasal spray, Place 2 sprays into both nostrils every morning., Disp: , Rfl:    ibuprofen (ADVIL,MOTRIN) 800 MG tablet, Take 1 tablet (800 mg total) by mouth every 8 (eight) hours as needed., Disp: 30 tablet, Rfl: 0   montelukast (SINGULAIR) 10 MG tablet, Take 10 mg by mouth at bedtime., Disp: , Rfl:

## 2022-09-25 NOTE — Patient Instructions (Addendum)
We will schedule you for pulmonary function tests at the earliest possible date.  We will check labs today to check your eosinophil level and IgE level along with allergy panel.  Recommend removing the carpet in your bedroom and installing hard surface flooring. Also recommend keeping pets out of your bedroom. A air purifier may provide benefit as well in your bedroom.  Continue montelukast and zyrtec for allergies  We will increase your symbicort inhaler dose to 160-4.63mg 2 puffs twice daily with spacer - rinse mouth out after each use  Continue to use albuterol inhaler as needed  Follow up in 3 months

## 2022-09-26 LAB — RESPIRATORY ALLERGY PANEL REGION II W/ RFLX: ~~LOC~~
Allergen, A. alternata, m6: <0.1 kU/L
Allergen, Cedar tree, t12: <0.1 kU/L
Allergen, Comm Silver Birch, t9: <0.1 kU/L
Allergen, Cottonwood, t14: <0.1 kU/L
Allergen, D pternoyssinus,d7: <0.1 kU/L
Allergen, Mouse Urine Protein, e78: <0.1 kU/L
Allergen, Mulberry, t76: <0.1 kU/L
Allergen, Oak,t7: <0.1 kU/L
Allergen, P. notatum, m1: <0.1 kU/L
Aspergillus fumigatus, m3: <0.1 kU/L
Bermuda Grass: <0.1 kU/L
Box Elder IgE: <0.1 kU/L
CLADOSPORIUM HERBARUM (M2) IGE: <0.1 kU/L
COMMON RAGWEED (SHORT) (W1) IGE: <0.1 kU/L
Cat Dander: <0.1 kU/L
Class: 0
Class: 0
Class: 0
Class: 0
Class: 0
Class: 0
Class: 0
Class: 0
Class: 0
Class: 0
Class: 0
Class: 0
Class: 0
Class: 0
Class: 0
Class: 0
Class: 0
Class: 0
Class: 0
Class: 0
Class: 0
Class: 0
Class: 0
Class: 0
Cockroach: <0.1 kU/L
D. farinae: <0.1 kU/L
Dog Dander: <0.1 kU/L
Elm IgE: <0.1 kU/L
IgE (Immunoglobulin E), Serum: 78 kU/L
Johnson Grass: <0.1 kU/L
Pecan/Hickory Tree IgE: <0.1 kU/L
Rough Pigweed  IgE: <0.1 kU/L
Sheep Sorrel IgE: <0.1 kU/L
Timothy Grass: <0.1 kU/L

## 2022-09-26 LAB — INTERPRETATION:

## 2022-09-27 ENCOUNTER — Ambulatory Visit (INDEPENDENT_AMBULATORY_CARE_PROVIDER_SITE_OTHER): Payer: 59 | Admitting: Pulmonary Disease

## 2022-09-27 ENCOUNTER — Inpatient Hospital Stay: Admission: RE | Admit: 2022-09-27 | Payer: 59 | Source: Ambulatory Visit

## 2022-09-27 DIAGNOSIS — J4540 Moderate persistent asthma, uncomplicated: Secondary | ICD-10-CM | POA: Diagnosis not present

## 2022-09-27 LAB — PULMONARY FUNCTION TEST
DL/VA % pred: 139 %
DL/VA: 5.86 ml/min/mmHg/L
DLCO cor % pred: 94 %
DLCO cor: 19.09 ml/min/mmHg
DLCO unc % pred: 94 %
DLCO unc: 19.09 ml/min/mmHg
FEF 25-75 Post: 1.85 L/sec
FEF 25-75 Pre: 1.39 L/sec
FEF2575-%Change-Post: 33 %
FEF2575-%Pred-Post: 78 %
FEF2575-%Pred-Pre: 59 %
FEV1-%Change-Post: 6 %
FEV1-%Pred-Post: 69 %
FEV1-%Pred-Pre: 65 %
FEV1-Post: 1.78 L
FEV1-Pre: 1.67 L
FEV1FVC-%Change-Post: 7 %
FEV1FVC-%Pred-Pre: 100 %
FEV6-%Change-Post: 2 %
FEV6-%Pred-Post: 66 %
FEV6-%Pred-Pre: 64 %
FEV6-Post: 2.12 L
FEV6-Pre: 2.06 L
FEV6FVC-%Change-Post: 3 %
FEV6FVC-%Pred-Post: 103 %
FEV6FVC-%Pred-Pre: 100 %
FVC-%Change-Post: 0 %
FVC-%Pred-Post: 64 %
FVC-%Pred-Pre: 64 %
FVC-Post: 2.12 L
FVC-Pre: 2.13 L
Post FEV1/FVC ratio: 84 %
Post FEV6/FVC ratio: 100 %
Pre FEV1/FVC ratio: 78 %
Pre FEV6/FVC Ratio: 97 %
RV % pred: 109 %
RV: 2.18 L
TLC % pred: 86 %
TLC: 4.38 L

## 2022-09-27 NOTE — Patient Instructions (Signed)
Full PFT performed today. °

## 2022-09-27 NOTE — Progress Notes (Signed)
Full PFT performed today. °

## 2022-10-21 ENCOUNTER — Other Ambulatory Visit: Payer: Self-pay | Admitting: Family Medicine

## 2022-10-21 DIAGNOSIS — R1032 Left lower quadrant pain: Secondary | ICD-10-CM

## 2022-10-21 DIAGNOSIS — R3129 Other microscopic hematuria: Secondary | ICD-10-CM

## 2022-11-26 ENCOUNTER — Encounter: Payer: Self-pay | Admitting: Family Medicine

## 2022-11-28 ENCOUNTER — Ambulatory Visit
Admission: RE | Admit: 2022-11-28 | Discharge: 2022-11-28 | Disposition: A | Discharge status: Home / Self Care | Payer: 59 | Source: Ambulatory Visit | Attending: Family Medicine | Admitting: Family Medicine

## 2022-11-28 DIAGNOSIS — R3129 Other microscopic hematuria: Secondary | ICD-10-CM

## 2022-11-28 DIAGNOSIS — R1032 Left lower quadrant pain: Secondary | ICD-10-CM

## 2022-11-28 MED ORDER — IOPAMIDOL (ISOVUE-300) INJECTION 61%
125.0000 mL | Freq: Once | INTRAVENOUS | Status: AC | PRN
  Administered 2022-11-28: 125 mL via INTRAVENOUS

## 2022-12-05 ENCOUNTER — Telehealth: Payer: Self-pay | Admitting: Pulmonary Disease

## 2022-12-05 ENCOUNTER — Ambulatory Visit (INDEPENDENT_AMBULATORY_CARE_PROVIDER_SITE_OTHER): Payer: 59 | Admitting: Pulmonary Disease

## 2022-12-05 ENCOUNTER — Encounter: Payer: Self-pay | Admitting: Pulmonary Disease

## 2022-12-05 VITALS — BP 108/76 | HR 74 | Temp 97.7°F | Ht 64.0 in | Wt 151.0 lb

## 2022-12-05 DIAGNOSIS — J4540 Moderate persistent asthma, uncomplicated: Secondary | ICD-10-CM

## 2022-12-05 NOTE — Telephone Encounter (Signed)
Called patient and she states that she went to her pharmacy and they told her that the Symbicort is needing a PA.  Can we get this started for her  Thank you

## 2022-12-05 NOTE — Patient Instructions (Addendum)
Please call [facility] to see if they cover other HFA inhalers such as advair HFA, Neena Rhymes, or AirDuo respiclick.  - let them know you are intolerant to dry powder inhalers and if I need to do a peer to peer I am happy to do that for you  Continue symbicort twice daily for now - rinse mouth out after each use  Follow up in 6 months.

## 2022-12-05 NOTE — Progress Notes (Signed)
Synopsis: Referred in September 2023 for asthma by [name], MD  Subjective:   PATIENT ID: Cynthia Ford GENDER: female DOB: [birth year], MRN: [id number]  HPI  Chief Complaint  Patient presents with   Follow-up    Doing well.  Pt states no sx noted.   Cynthia Ford is a 61 year old woman, never smoker with history of left breast cancer, GERD and asthma who returns to pulmonary clinic for asthma.   She was started on symbicort 160-4.49mg 2 puffs twice daily at last visit. She has been feeling well since last visit. She reports her symbicort will not be covered in 2024 by her insurance company. She does not tolerate dry powder inhalers due to infections.   Labs showed absolute eosinophil count of 900 and IgE 78.   PFTs show non-specific pulmonary function pattern with reduced FEV1 and FVC but normal TLC.   Initial OV 09/25/22 She reports being diagnosed with asthma in the early 2000s. She reports significant seasonal allergies and allergies to pet dander. She has 2 dogs at home. Spring and fall seasons are difficult for her breathing. She is currently using symbicort 80-4.550m 2 puffs twice daily and singulair daily. She reports improvement in symptoms since starting singulair a few months ago. She is taking zyrtec for allergies. She wakes up 2 nights per week due to cough, wheezing or dyspnea.   She had no issues in childhood with her breathing. She played highschool sports. She was exposed to second hand smoke from her mother. Her mother had asthma/copd. 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 Her breathing worsened 5 years into her work. 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  She has carpet in the bedrooms of her homes. The dogs are in her bedroom and stay in there at night.   Past Medical History:  Diagnosis Date   Allergy     Anemia    Anxiety    Asthma    [place]ast cancer ([place]   Left Breast Cancer   GERD (gastroesophageal reflux disease)    Papilloma of left breast    Personal history of radiation therapy    PONV (postoperative nausea and vomiting)    Salivary gland infection 11/2019     Family History  Problem Relation Age of Onset   Breast cancer Other 3880     niece   Prostate cancer Brother [phone] Prostate cancer Brother [phone] Prostate cancer Maternal Grandfather [phone]  Colon polyps Mother    Colon polyps Sister    Rectal cancer Paternal Aunt    Colon cancer Maternal Grandmother    Esophageal cancer Neg Hx    Stomach cancer Neg Hx      Social History   Socioeconomic History   Marital status: DoSoil scientist  Spouse name: Not on file   Number of children: 0   Years of education: Not on file   Highest education level: Not on file  Occupational History   Occupation: QCArchitectTobacco Use   Smoking status: Never   Smokeless tobacco: Never  Vaping Use   Vaping Use: Never used  Substance and Sexual Activity   Alcohol use: No    Alcohol/week: 0.0 standard drinks of alcohol   Drug use: No   Sexual activity:  Yes    Birth control/protection: Post-menopausal  Other Topics Concern   Not on file  Social History Narrative   Not on file   Social Determinants of Health   Financial Resource Strain: Not on file  Food Insecurity: Not on file  Transportation Needs: Not on file  Physical Activity: Not on file  Stress: Not on file  Social Connections: Not on file  Intimate Partner Violence: Not on file     No Known Allergies   Outpatient Medications Prior to Visit  Medication Sig Dispense Refill   albuterol (PROVENTIL HFA;VENTOLIN HFA) 108 (90 BASE) MCG/ACT inhaler Inhale into the lungs every 6 (six) hours as needed for wheezing or shortness of breath.     Ascorbic Acid (VITA-C PO) Take 1 tablet by mouth daily.      budesonide-formoterol (SYMBICORT) 160-4.5 MCG/ACT inhaler  Inhale 2 puffs into the lungs 2 (two) times daily. 1 each 6   CALCIUM PO Take 600 mg by mouth daily.      cetirizine (ZYRTEC) 10 MG tablet Take 10 mg by mouth daily.     cholecalciferol (VITAMIN D) 1000 units tablet Take 2,000 Units by mouth daily.     fluticasone (FLONASE) 50 MCG/ACT nasal spray Place 2 sprays into both nostrils every morning.     ibuprofen (ADVIL,MOTRIN) 800 MG tablet Take 1 tablet (800 mg total) by mouth every 8 (eight) hours as needed. 30 tablet 0   montelukast (SINGULAIR) 10 MG tablet Take 10 mg by mouth at bedtime.     No facility-administered medications prior to visit.   Review of Systems  Constitutional:  Negative for chills, fever, malaise/fatigue and weight loss.  HENT:  Negative for congestion, sinus pain and sore throat.   Eyes: Negative.   Respiratory:  Negative for cough, hemoptysis, sputum production, shortness of breath and wheezing.   Cardiovascular:  Negative for chest pain, palpitations, orthopnea, claudication and leg swelling.  Gastrointestinal:  Negative for abdominal pain, heartburn, nausea and vomiting.  Genitourinary: Negative.   Musculoskeletal:  Positive for joint pain. Negative for myalgias.  Skin:  Negative for rash.  Neurological:  Negative for weakness.  Endo/Heme/Allergies:  Positive for environmental allergies.  Psychiatric/Behavioral: Negative.     Objective:   Vitals:   12/05/22 0836  BP: 108/76  Pulse: 74  Temp: 97.7 F (36.5 C)  TempSrc: Oral  SpO2: 96%  Weight: 151 lb (68.5 kg)  Height: '5\' 4"'$  (1.626 m)    Physical Exam Constitutional:      General: She is not in acute distress.    Appearance: She is not ill-appearing.  HENT:     Head: Normocephalic and atraumatic.  Eyes:     General: No scleral icterus.    Conjunctiva/sclera: Conjunctivae normal.  Cardiovascular:     Rate and Rhythm: Normal rate and regular rhythm.     Pulses: Normal pulses.     Heart sounds: Normal heart sounds. No murmur heard. Pulmonary:      Effort: Pulmonary effort is normal.     Breath sounds: No wheezing, rhonchi or rales.  Musculoskeletal:     Right lower leg: No edema.     Left lower leg: No edema.  Skin:    General: Skin is warm and dry.  Neurological:     General: No focal deficit present.     Mental Status: She is alert.    CBC    Component Value Date/Time   WBC 8.6 09/25/2022 1118   RBC 4.88 09/25/2022 1118  HGB 13.2 09/25/2022 1118   HGB 13.8 07/15/2022 1400   HGB 13.3 07/08/2017 1225   HCT 38.5 09/25/2022 1118   HCT 38.3 07/08/2017 1225   PLT 286.0 09/25/2022 1118   PLT 326 07/15/2022 1400   PLT 293 07/08/2017 1225   MCV 78.8 09/25/2022 1118   MCV 78.4 (L) 07/08/2017 1225   MCH 27.2 07/15/2022 1400   MCHC 34.3 09/25/2022 1118   RDW 14.0 09/25/2022 1118   RDW 13.6 07/08/2017 1225   LYMPHSABS 1.6 09/25/2022 1118   LYMPHSABS 1.6 07/08/2017 1225   MONOABS 0.6 09/25/2022 1118   MONOABS 0.6 07/08/2017 1225   EOSABS 0.9 (H) 09/25/2022 1118   EOSABS 0.7 (H) 07/08/2017 1225   BASOSABS 0.1 09/25/2022 1118   BASOSABS 0.0 07/08/2017 1225      Latest Ref Rng & Units 07/15/2022    2:00 PM 07/16/2021   12:39 PM 12/11/2020   12:18 PM  BMP  Glucose 70 - 99 mg/dL 97  95  84   BUN 6 - 20 mg/dL '14  13  12   '$ Creatinine 0.44 - 1.00 mg/dL 0.84  0.87  0.94   Sodium 135 - 145 mmol/L 139  141  140   Potassium 3.5 - 5.1 mmol/L 4.1  3.7  4.1   Chloride 98 - 111 mmol/L 103  104  102   CO2 22 - 32 mmol/L '31  28  28   '$ Calcium 8.9 - 10.3 mg/dL 10.0  9.3  9.6    Chest imaging: CXR 05/15/22 The heart size and mediastinal contours are within normal limits. Both lungs are clear. The visualized skeletal structures are unremarkable.  PFT:    Latest Ref Rng & Units 09/27/2022    1:59 PM  PFT Results  FVC-Pre L 2.13   FVC-Predicted Pre % 64   FVC-Post L 2.12   FVC-Predicted Post % 64   Pre FEV1/FVC % % 78   Post FEV1/FCV % % 84   FEV1-Pre L 1.67   FEV1-Predicted Pre % 65   FEV1-Post L 1.78   DLCO uncorrected  ml/min/mmHg 19.09   DLCO UNC% % 94   DLCO corrected ml/min/mmHg 19.09   DLCO COR %Predicted % 94   DLVA Predicted % 139   TLC L 4.38   TLC % Predicted % 86   RV % Predicted % 109     Labs:  Path:  Echo:  Heart Catheterization:  Assessment & Plan:   Moderate persistent asthma without complication  Discussion: Cynthia Ford is a 61 year old woman, never smoker with history of left breast cancer, GERD and asthma who returns to pulmonary clinic for asthma.   Her PFTs show non-specific pulmonary function. She has noticed improvement in her symptoms with increase in symbicort to 160-4.85mg 2 puffs twice daily.   She is to talk with her insurance company about alternatives to symbicort as she received notice that it will not be covered next year.   Follow up in 6 months.  JFreda Jackson MD [facility] Office: [phone]  Current Outpatient Medications:    albuterol (PROVENTIL HFA;VENTOLIN HFA) 108 (90 BASE) MCG/ACT inhaler, Inhale into the lungs every 6 (six) hours as needed for wheezing or shortness of breath., Disp: , Rfl:    Ascorbic Acid (VITA-C PO), Take 1 tablet by mouth daily. , Disp: , Rfl:    budesonide-formoterol (SYMBICORT) 160-4.5 MCG/ACT inhaler, Inhale 2 puffs into the lungs 2 (two) times daily., Disp: 1 each, Rfl: 6  CALCIUM PO, Take 600 mg by mouth daily. , Disp: , Rfl:    cetirizine (ZYRTEC) 10 MG tablet, Take 10 mg by mouth daily., Disp: , Rfl:    cholecalciferol (VITAMIN D) 1000 units tablet, Take 2,000 Units by mouth daily., Disp: , Rfl:    fluticasone (FLONASE) 50 MCG/ACT nasal spray, Place 2 sprays into both nostrils every morning., Disp: , Rfl:    ibuprofen (ADVIL,MOTRIN) 800 MG tablet, Take 1 tablet (800 mg total) by mouth every 8 (eight) hours as needed., Disp: 30 tablet, Rfl: 0   montelukast (SINGULAIR) 10 MG tablet, Take 10 mg by mouth at bedtime., Disp: , Rfl:

## 2022-12-06 ENCOUNTER — Other Ambulatory Visit (HOSPITAL_COMMUNITY): Payer: Self-pay

## 2022-12-06 NOTE — Telephone Encounter (Signed)
Per benefits investigation medication for Symbicort is currently showing as too soon to refill until 12/16, was last filled 11/23.  Brand name is covered for this medication.

## 2022-12-06 NOTE — Telephone Encounter (Signed)
Called and updated patient and told her that if the medication is needing a PA for 2024 we will do it at that time. Nothing further needed

## 2022-12-11 ENCOUNTER — Other Ambulatory Visit: Payer: Self-pay | Admitting: Hematology

## 2022-12-11 DIAGNOSIS — Z1231 Encounter for screening mammogram for malignant neoplasm of breast: Secondary | ICD-10-CM

## 2023-01-10 NOTE — Telephone Encounter (Signed)
Pt called checking on status of PA to see if it had been done yet. Routing to prior [place] team.

## 2023-01-14 ENCOUNTER — Other Ambulatory Visit (HOSPITAL_COMMUNITY): Payer: Self-pay

## 2023-01-14 ENCOUNTER — Telehealth: Payer: Self-pay

## 2023-01-14 NOTE — Telephone Encounter (Signed)
PA request received via patient for Symbicort 160-4.5MCG/ACT aerosol  PA has been submitted to [place] and is pending determination.   Key: [id number]

## 2023-01-14 NOTE — Telephone Encounter (Signed)
Now working on [place] for the 2024 calendar year, will be updated in additional encounter.

## 2023-01-15 ENCOUNTER — Other Ambulatory Visit: Payer: Self-pay

## 2023-01-15 ENCOUNTER — Other Ambulatory Visit: Payer: Self-pay | Admitting: Pulmonary Disease

## 2023-01-15 MED ORDER — FLUTICASONE-SALMETEROL 230-21 MCG/ACT IN AERO: 2.0000 | INHALATION_SPRAY | Freq: Two times a day (BID) | RESPIRATORY_TRACT | 3 refills | Status: DC

## 2023-01-15 NOTE — Telephone Encounter (Signed)
Spoke to patient advised of Fluticasone-Salmeterol being sent to pharmacy. She verbalized understanding. Nothing further needed.

## 2023-01-15 NOTE — Telephone Encounter (Signed)
Please send in prescriptions fluticasone-salmeterol 230-83mg 2 puffs twice daily.  Thanks, JD

## 2023-01-15 NOTE — Telephone Encounter (Signed)
PA has been DENIED due to:  Coverage for this medication is denied for the following reason(s). We reviewed the information we received about your condition and circumstances. We used plan approved criteria when making this decision. The policy states that this medication may be approved when: -The member is unable to take the required number of formulary alternatives for the given diagnosis due to an intolerance or contraindication OR -The member has tried and failed the required number of formulary alternatives. Based on the policy and the information we received your request is denied. We did not receive documentation that you meet the criteria outlined above. Formulary alternatives are: fluticasone-salmeterol (except certain NDCs), Wixela Inhub, Breo Ellipta (except certain NDCs). Requirement: 3 in a class with 3 or more alternatives, 2 in a class with 2 alternatives, or 1 in a class 1 alternative

## 2023-01-15 NOTE — Telephone Encounter (Signed)
Please advise on PA  for Symbicort  PA has been DENIED due to:   Coverage for this medication is denied for the following reason(s). We reviewed the information we received about your condition and circumstances. We used plan approved criteria when making this decision. The policy states that this medication may be approved when: -The member is unable to take the required number of formulary alternatives for the given diagnosis due to an intolerance or contraindication OR -The member has tried and failed the required number of formulary alternatives. Based on the policy and the information we received your request is denied. We did not receive documentation that you meet the criteria outlined above. Formulary alternatives are: fluticasone-salmeterol (except certain NDCs), Wixela Inhub, Breo Ellipta (except certain NDCs). Requirement: 3 in a class with 3 or more alternatives, 2 in a class with 2 alternatives, or 1 in a class 1 alternative

## 2023-01-21 ENCOUNTER — Telehealth: Payer: Self-pay | Admitting: Pulmonary Disease

## 2023-01-21 NOTE — Telephone Encounter (Signed)
See last signed Tel Encounter. Pt is asking [place] to file and "Urgent Appeal" for Pre-Auth.  Please call to advise @ [phone]

## 2023-01-22 NOTE — Telephone Encounter (Signed)
Patient is returning phone call. Patient phone number is [phone].

## 2023-01-22 NOTE — Telephone Encounter (Signed)
Called and left voicemail for patient to call office back to go over her denial for Symbicort and if she picked up the Advair and how she was doing.

## 2023-01-23 ENCOUNTER — Other Ambulatory Visit: Payer: Self-pay

## 2023-01-23 ENCOUNTER — Other Ambulatory Visit: Payer: Self-pay | Admitting: Pulmonary Disease

## 2023-01-23 DIAGNOSIS — J4540 Moderate persistent asthma, uncomplicated: Secondary | ICD-10-CM

## 2023-01-23 MED ORDER — FLUTICASONE-SALMETEROL 230-21 MCG/ACT IN AERO: 2.0000 | INHALATION_SPRAY | Freq: Two times a day (BID) | RESPIRATORY_TRACT | 12 refills | Status: DC

## 2023-01-24 NOTE — Telephone Encounter (Signed)
Patient is returning phone call. Patient phone number is [phone].

## 2023-01-24 NOTE — Telephone Encounter (Signed)
Spoke with pt who states she received a denial letter from [facility] for PA on Symbicort. Pt states that Advair is not covered so she has not attempted to use Advair at all. Pt states she can not use powder based inhalers because they all cause her to have thrush which has led to a hospitalization  in the past. Pt would like to file an appeal to try and get the Symbicort covered.   Pharmacy can you please give [place] information on denial and what information is needed to file appeal?   Routing to Dr. Erin Fulling as well as an Juluis Rainier

## 2023-01-27 NOTE — Telephone Encounter (Signed)
Previous notation of Wixela (Advair Diskus) is preferred by patients insurance, provider sent in Advair [facility] which is not covered.  If patient still wants appeal done on Symbicort the providers office would have to initiate this due to short staffing in [place] team.

## 2023-01-27 NOTE — Telephone Encounter (Signed)
Please initiate an appeal so we can get her qualified for symbicort.  Thanks, JD

## 2023-01-28 NOTE — Telephone Encounter (Signed)
Please see provider recommendations

## 2023-01-29 NOTE — Telephone Encounter (Signed)
Please see previous notation, providers office would have to initiate appeals at this time.

## 2023-02-07 ENCOUNTER — Ambulatory Visit
Admission: RE | Admit: 2023-02-07 | Discharge: 2023-02-07 | Disposition: A | Discharge status: Home / Self Care | Payer: 59 | Source: Ambulatory Visit | Attending: Hematology | Admitting: Hematology

## 2023-02-07 DIAGNOSIS — Z1231 Encounter for screening mammogram for malignant neoplasm of breast: Secondary | ICD-10-CM

## 2023-02-14 NOTE — Telephone Encounter (Signed)
Do you still have the denial documents for this? So that we can know what to do for the appeals process?

## 2023-02-17 NOTE — Telephone Encounter (Signed)
Routing to PA Team to do a new PA.

## 2023-02-17 NOTE — Telephone Encounter (Signed)
PA denial letter has been scanned into patient documents 01/17 and will have details necessary to initiate appeal.

## 2023-02-17 NOTE — Telephone Encounter (Signed)
Richardson Landry called in to inform us that when it come to prescriptions there is no limit to Prior authorization we can do. He stated to try a new auth instead a of appeal bc an appeal can take up to a month or even more. He speculates the reason of denial is because there was no proof that the generic brands were tried. If there is proof that the generic brands do not work for the patient then they will approve for the name brand of the mediciation

## 2023-02-24 NOTE — Telephone Encounter (Signed)
Please see additional information in denial letter attached in patient documents. PA's do have limitations in the number of submissions, there is already a denial on file which would result in an immediate denial if another claim was to be processed.

## 2023-03-01 ENCOUNTER — Other Ambulatory Visit: Payer: Self-pay | Admitting: Pulmonary Disease

## 2023-03-01 DIAGNOSIS — J4540 Moderate persistent asthma, uncomplicated: Secondary | ICD-10-CM

## 2023-03-03 ENCOUNTER — Telehealth: Payer: Self-pay | Admitting: Pulmonary Disease

## 2023-03-03 NOTE — Telephone Encounter (Signed)
Pt. Calling back to see if the second appeal has gone through on her inhalers and seems like from PA team there is nothing much we can do [place] less prescribed a different med for her insurance will cover and she had no more meds now that she has been waiting on this

## 2023-03-03 NOTE — Telephone Encounter (Signed)
See additional encounter open for same reason.

## 2023-03-03 NOTE — Telephone Encounter (Signed)
Pt. Calling again to make sure PA team make the right notes to get the drug approved  she spoke to supervisor and the the powder inhaler has made her have sever reactions to the drug that made to be in HOSP but if drug company dose not have details they will not approve it for her

## 2023-03-03 NOTE — Telephone Encounter (Signed)
[facility] PT calling from [place]. She is at [phone]. Call this # to start process and to file an appeal.   States PT said the only thing that works for her is Symbicort. She is intolerant to others. They are asking [place] to file an immediate appeal for one of the following: Wixela, Ellipta or Fluticasone.  On 1/16 appeal was denied because of not enough clinical information.

## 2023-03-04 ENCOUNTER — Telehealth: Payer: Self-pay

## 2023-03-04 NOTE — Telephone Encounter (Signed)
PA resubmitted again and is pending determination. Will update in additional encounter created.

## 2023-03-04 NOTE — Telephone Encounter (Signed)
PA submitted with additional information for Symbicort 160-4.5MCG/ACT aerosol via CMM to [employer]  Faxed to plan via CMM and is pending determination.  Key: [id number]

## 2023-03-05 ENCOUNTER — Other Ambulatory Visit (HOSPITAL_COMMUNITY): Payer: Self-pay

## 2023-03-07 NOTE — Telephone Encounter (Signed)
PA for Symbicort was approved. Attempted to call pt to let her know this info but unable to reach. Left her a detailed message letting her know this. Nothing further needed.

## 2023-03-07 NOTE — Telephone Encounter (Signed)
PA was started for Symbicort. See separate encounter.

## 2023-03-10 ENCOUNTER — Other Ambulatory Visit: Payer: Self-pay | Admitting: Pulmonary Disease

## 2023-03-10 DIAGNOSIS — J4540 Moderate persistent asthma, uncomplicated: Secondary | ICD-10-CM

## 2023-06-16 IMAGING — MG DIGITAL DIAGNOSTIC BILAT W/ TOMO W/ CAD
8 of 15 series · 8 of 40 positions shown · non-contrast
Comparison: Previous exam(s).

CLINICAL DATA: History of left breast papilloma in 2144, upgraded
at surgical excision to cancer, status post radiation therapy.
History of additional biopsy-proven high-risk lesions in the right
breast, status post surgical excision.

Follow-up of probably benign left breast calcifications, initially
imaged December 2019.
EXAM:
DIGITAL DIAGNOSTIC BILATERAL MAMMOGRAM WITH TOMOSYNTHESIS AND CAD;
ULTRASOUND RIGHT BREAST LIMITED
TECHNIQUE: Bilateral digital diagnostic mammography and breast tomosynthesis
was performed. The images were evaluated with computer-aided
detection.; Targeted ultrasound examination of the right breast was
performed

[L ML]
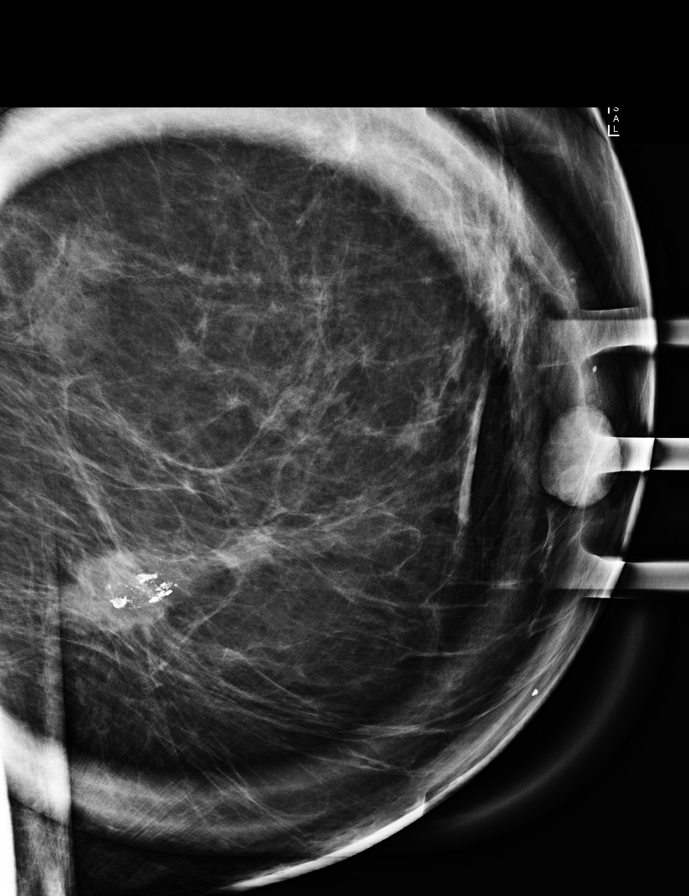

[L CC]
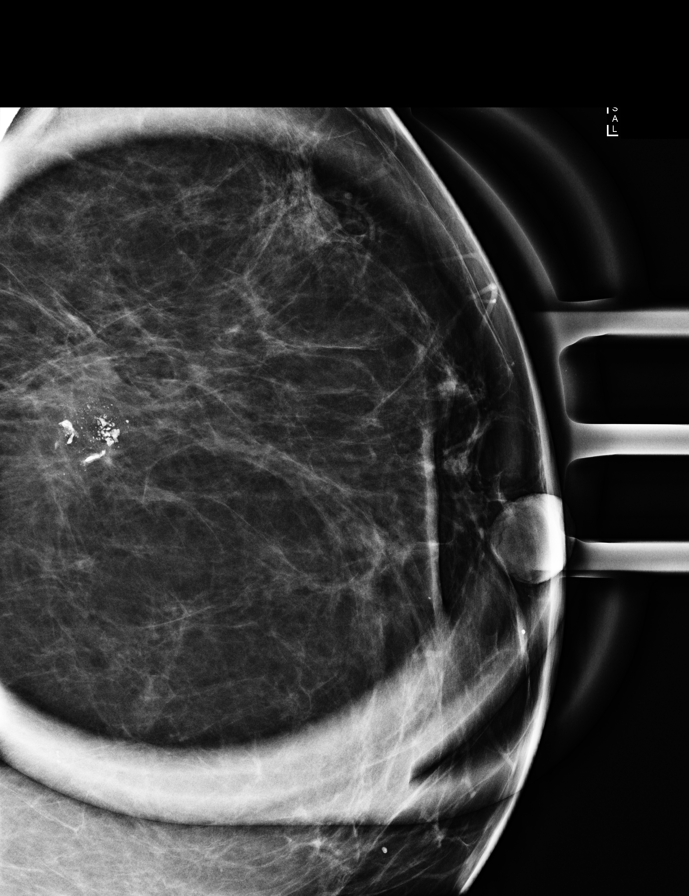

[R MLO synth-2D (1 of 2)]
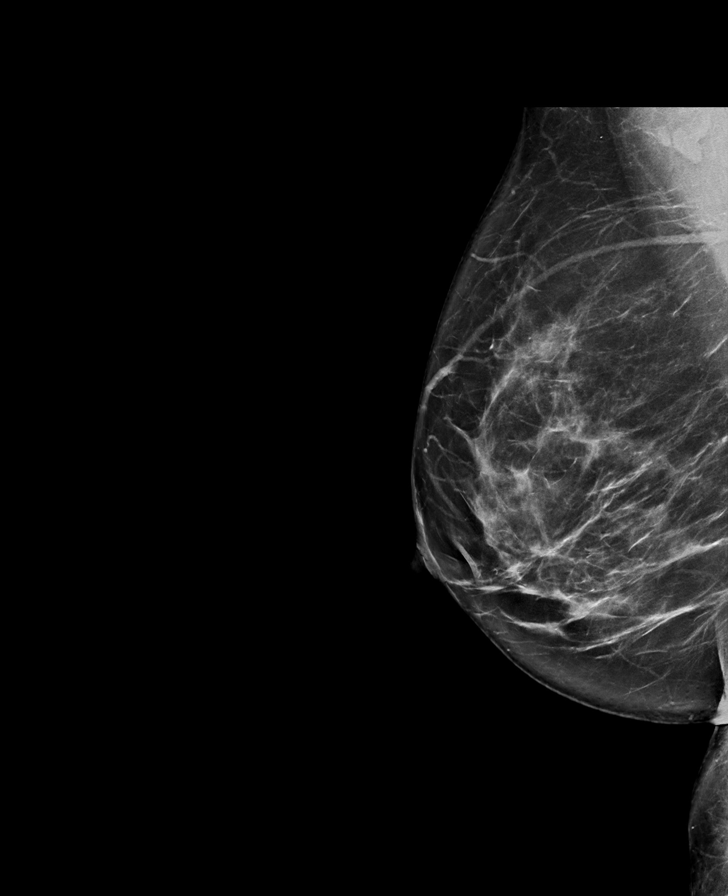

[L CC synth-2D]
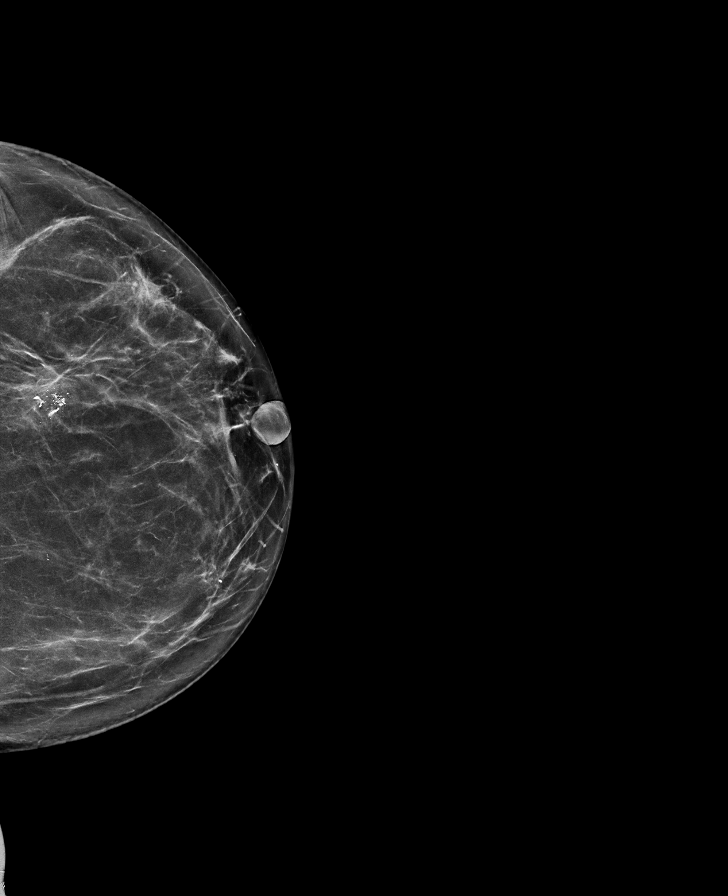

[R CC synth-2D (1 of 2)]
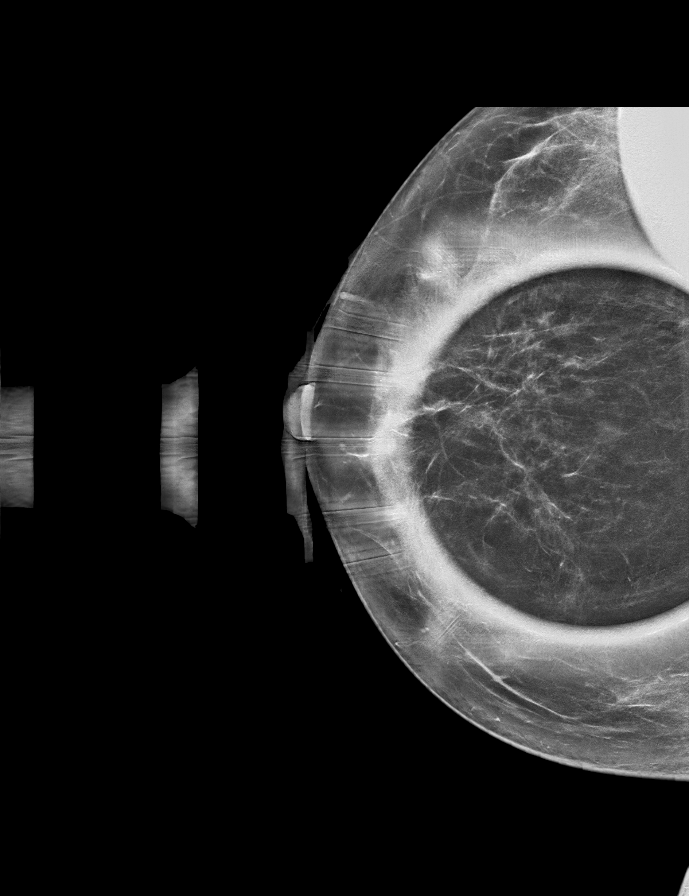

[R MLO synth-2D (2 of 2)]
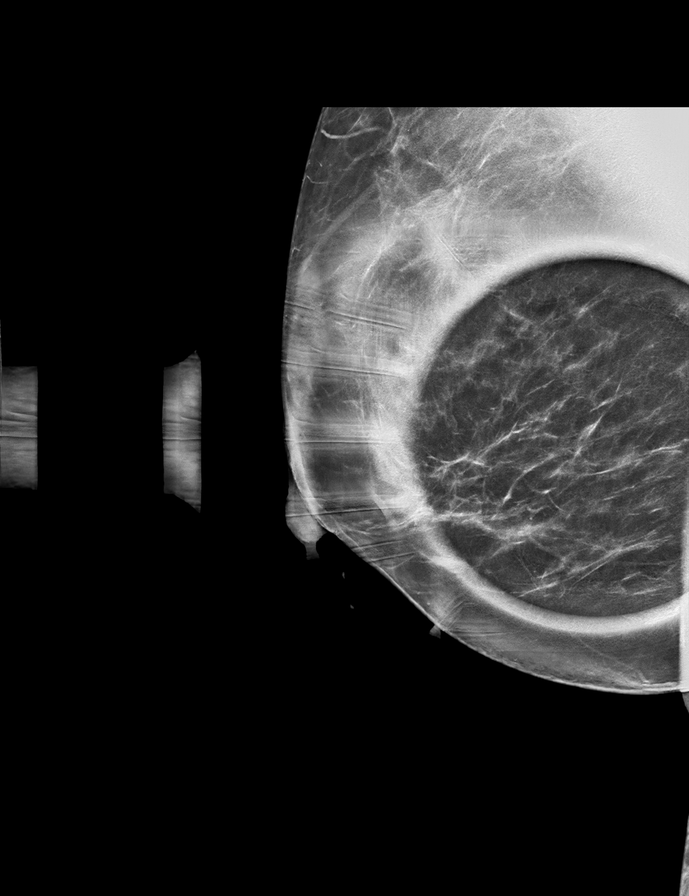

[R ML synth-2D]
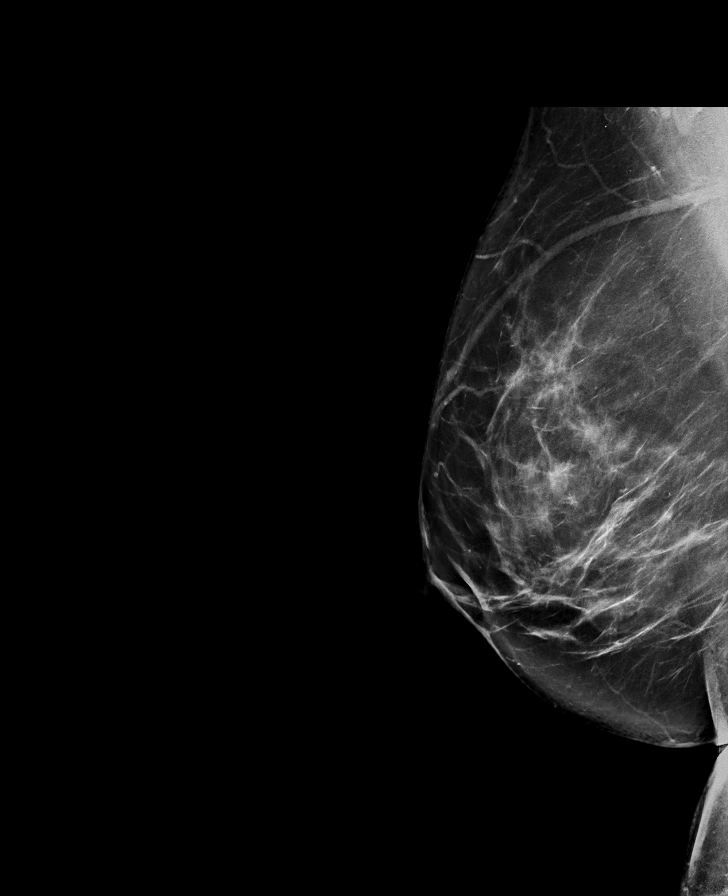

[R CC synth-2D (2 of 2)]
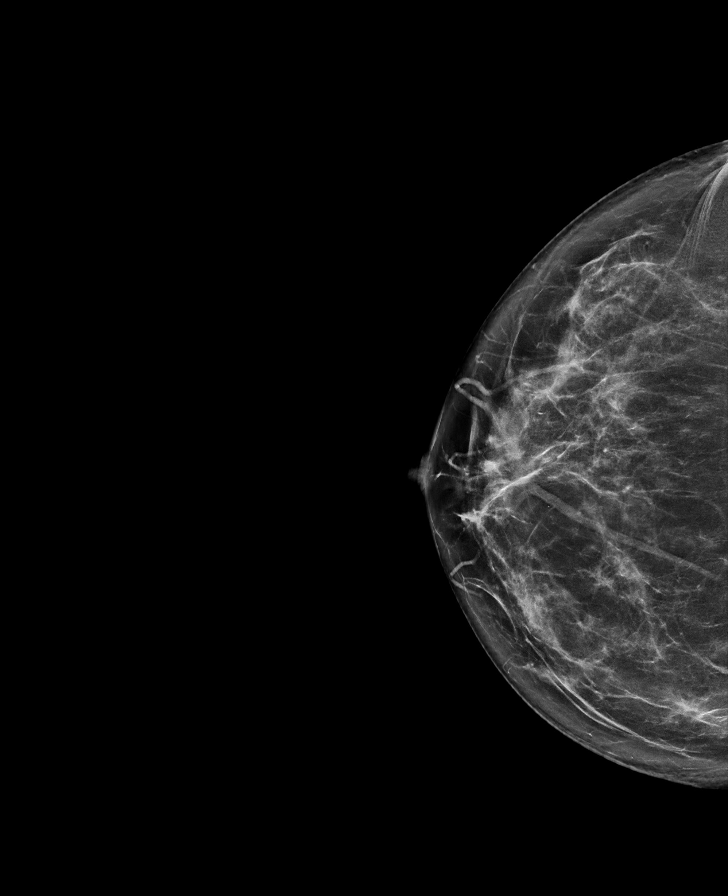

[8 of 40 positions shown; findings below may reference images not displayed]

ACR Breast Density Category b: There are scattered areas of
fibroglandular density.
FINDINGS: RIGHT breast: A 0.5 cm oval circumscribed mass is noted in the
central, slightly lower right breast at posterior depth, new
compared to 01/30/2021.

Targeted ultrasound of the right breast at the [DATE] position 1 cm
from the nipple demonstrates a 0.4 x 0.3 x 0.5 cm oval anechoic
circumscribed cyst, corresponding with the mammographic finding.

LEFT breast: Interval coarsening of dystrophic calcifications at the
lumpectomy site in the slightly outer lower left breast at posterior
depth. No new suspicious mass, suspicious calcifications or other
findings in the left breast.
IMPRESSION: 1. Benign dystrophic calcifications at the left breast lumpectomy
site. No additional dedicated follow-up imaging indicated.
2. Incidental 0.5 cm benign simple cyst at the right breast [DATE]
position.
3. No mammographic findings of malignancy in either breast.

RECOMMENDATION:
Annual screening mammogram in 1 year.

I have discussed the findings and recommendations with the patient.
If applicable, a reminder letter will be sent to the patient
regarding the next appointment.

BI-RADS CATEGORY  2: Benign.

## 2023-09-23 ENCOUNTER — Encounter: Payer: Self-pay | Admitting: Pulmonary Disease

## 2023-09-23 DIAGNOSIS — J4540 Moderate persistent asthma, uncomplicated: Secondary | ICD-10-CM

## 2023-09-24 MED ORDER — BUDESONIDE-FORMOTEROL FUMARATE 160-4.5 MCG/ACT IN AERO: 2.0000 | INHALATION_SPRAY | Freq: Two times a day (BID) | RESPIRATORY_TRACT | 0 refills | Status: DC

## 2023-09-28 IMAGING — DX DG CHEST 1V PORT
1 series · 1 of 1 positions shown · non-contrast
Comparison: 09/20/2016

CLINICAL DATA: Short of breath

EXAM:
PORTABLE CHEST 1 VIEW

[chest ap]
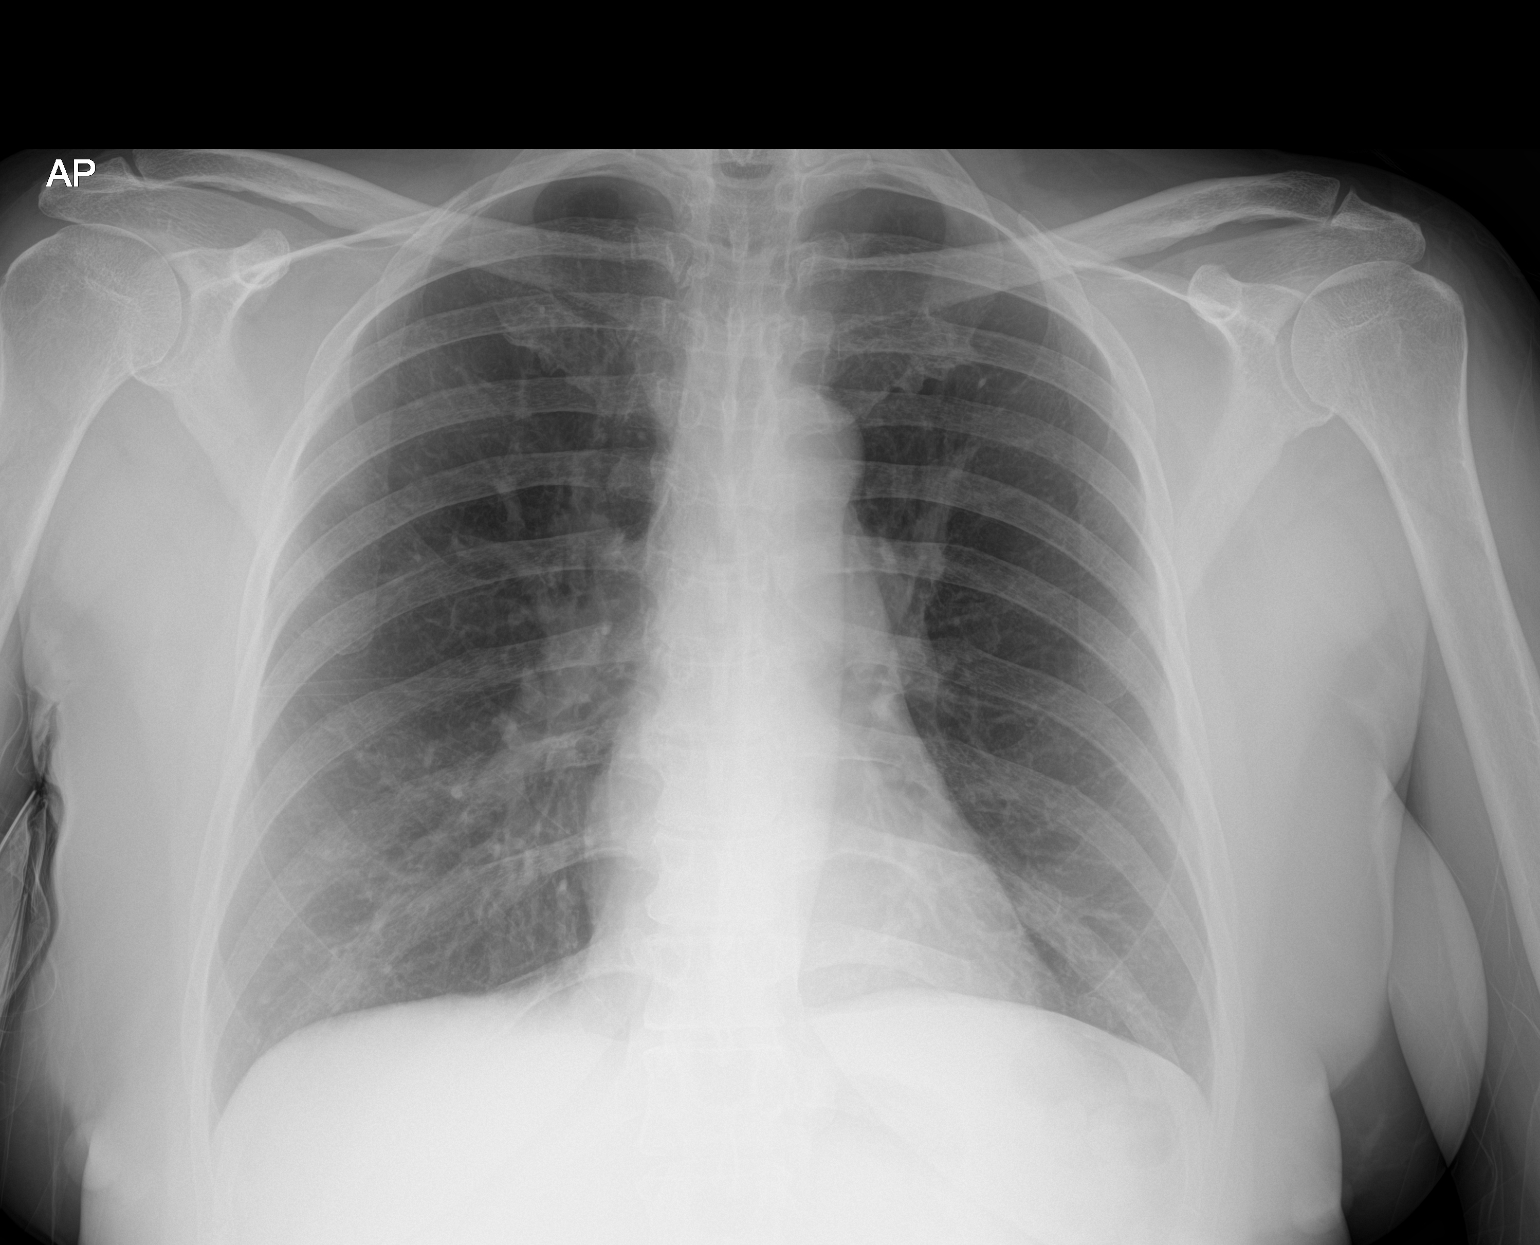

[1 of 1 positions shown; findings below may reference images not displayed]

FINDINGS: The heart size and mediastinal contours are within normal limits.
Both lungs are clear. The visualized skeletal structures are
unremarkable.
IMPRESSION: No active disease.

## 2023-11-11 ENCOUNTER — Encounter: Payer: Self-pay | Admitting: Internal Medicine

## 2023-12-04 ENCOUNTER — Ambulatory Visit: Payer: 59 | Admitting: Pulmonary Disease

## 2023-12-04 ENCOUNTER — Encounter: Payer: Self-pay | Admitting: Pulmonary Disease

## 2023-12-04 DIAGNOSIS — J4540 Moderate persistent asthma, uncomplicated: Secondary | ICD-10-CM | POA: Diagnosis not present

## 2023-12-04 MED ORDER — MONTELUKAST SODIUM 10 MG PO TABS: 10.0000 mg | ORAL_TABLET | Freq: Every day | ORAL | 3 refills | Status: DC

## 2023-12-04 MED ORDER — BREZTRI AEROSPHERE 160-9-4.8 MCG/ACT IN AERO: 2.0000 | INHALATION_SPRAY | Freq: Two times a day (BID) | RESPIRATORY_TRACT | 0 refills | Status: DC

## 2023-12-04 MED ORDER — BUDESONIDE-FORMOTEROL FUMARATE 160-4.5 MCG/ACT IN AERO: 2.0000 | INHALATION_SPRAY | Freq: Two times a day (BID) | RESPIRATORY_TRACT | 3 refills | Status: DC

## 2023-12-04 NOTE — Patient Instructions (Addendum)
Try breztri inhaler 2 puffs twice daily - rinse mouth out after each use  Stop symbicort inhaler while using the breztri  Continue montelukast 10mg  daily  Follow up in 1 year, call sooner if needed

## 2023-12-04 NOTE — Addendum Note (Signed)
Addended by: Charlott Holler on: 12/04/2023 12:46 PM   Modules accepted: Orders

## 2023-12-04 NOTE — Progress Notes (Signed)
Synopsis: Referred in September 2023 for asthma by Darrow Bussing, MD  Subjective:   PATIENT ID: Cynthia Ford GENDER: female DOB: 12-13-1961, MRN: [id number]  HPI  Chief Complaint  Patient presents with   Follow-up    C/o hoarseness at times, no sob, occass. Cough-white, wheezing   Cynthia Ford is a 62 year old woman, never smoker with history of left breast cancer, GERD and asthma who returns to pulmonary clinic for asthma.   Initial OV 09/25/22 She reports being diagnosed with asthma in the early 2000s. She reports significant seasonal allergies and allergies to pet dander. She has 2 dogs at home. Spring and fall seasons are difficult for her breathing. She is currently using symbicort 80-4.44mcg 2 puffs twice daily and singulair daily. She reports improvement in symptoms since starting singulair a few months ago. She is taking zyrtec for allergies. She wakes up 2 nights per week due to cough, wheezing or dyspnea.   She had no issues in childhood with her breathing. She played highschool sports. She was exposed to second hand smoke from her mother. Her mother had asthma/copd. 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 Her breathing worsened 5 years into her work. 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 She retired in 2020.  She has carpet in the bedrooms of her homes. The dogs are in her bedroom and stay in there at night.    OV 12/05/22 She was started on symbicort 160-4.61mcg 2 puffs twice daily at last visit. She has been feeling well since last visit. She reports her symbicort will not be covered in 2024 by her insurance company. She does not tolerate dry powder inhalers due to infections.   Labs showed absolute eosinophil count of 900 and IgE 78.   PFTs show non-specific pulmonary function pattern with reduced FEV1 and FVC but normal TLC.    OV Today  12/04/23 The patient, with a history of asthma, presents with wheezing and difficulty adhering to their exercise program. They report that their symptoms are well controlled with Symbicort 160, taken two puffs twice daily, and montelukast at night. They note that if they delay their Symbicort dose by more than twelve hours, they notice a difference in their breathing. They also report that the montelukast causes coughing initially, but after it settles down, it seems to really help with their breathing. They have not been waking up at night due to cough, wheezing, or shortness of breath in the last four weeks. They also mention that they have been using air purifiers at night, which they believe may be helping with their coughing.  ACT 23  Past Medical History:  Diagnosis Date   Allergy    Anemia    Anxiety    Asthma    Breast cancer (HCC) 2016   Left Breast Cancer   GERD (gastroesophageal reflux disease)    Papilloma of left breast    Personal history of radiation therapy    PONV (postoperative nausea and vomiting)    Salivary gland infection 11/2019     Family History  Problem Relation Age of Onset   Breast cancer Other 35       niece   Prostate cancer Brother 70   Prostate cancer Brother 50   Prostate cancer Maternal Grandfather 27.   Colon polyps Mother    Colon polyps Sister  Rectal cancer Paternal Aunt    Colon cancer Maternal Grandmother    Esophageal cancer Neg Hx    Stomach cancer Neg Hx      Social History   Socioeconomic History   Marital status: Media planner    Spouse name: Not on file   Number of children: 0   Years of education: Not on file   Highest education level: Not on file  Occupational History   Occupation: Gaffer  Tobacco Use   Smoking status: Never   Smokeless tobacco: Never  Vaping Use   Vaping status: Never Used  Substance and Sexual Activity   Alcohol use: No    Alcohol/week: 0.0 standard drinks of alcohol   Drug use: No    Sexual activity: Yes    Birth control/protection: Post-menopausal  Other Topics Concern   Not on file  Social History Narrative   Not on file   Social Determinants of Health   Financial Resource Strain: Not on file  Food Insecurity: Not on file  Transportation Needs: Not on file  Physical Activity: Not on file  Stress: Not on file  Social Connections: Not on file  Intimate Partner Violence: Not on file     No Known Allergies   Outpatient Medications Prior to Visit  Medication Sig Dispense Refill   Ascorbic Acid (VITA-C PO) Take 1 tablet by mouth daily.      CALCIUM PO Take 600 mg by mouth daily.      cetirizine (ZYRTEC) 10 MG tablet Take 10 mg by mouth daily.     cholecalciferol (VITAMIN D) 1000 units tablet Take 2,000 Units by mouth daily.     fluticasone (FLONASE) 50 MCG/ACT nasal spray Place 2 sprays into both nostrils every morning.     ibuprofen (ADVIL,MOTRIN) 800 MG tablet Take 1 tablet (800 mg total) by mouth every 8 (eight) hours as needed. 30 tablet 0   budesonide-formoterol (SYMBICORT) 160-4.5 MCG/ACT inhaler Inhale 2 puffs into the lungs in the morning and at bedtime. 3 each 0   montelukast (SINGULAIR) 10 MG tablet Take 10 mg by mouth at bedtime.     albuterol (PROVENTIL HFA;VENTOLIN HFA) 108 (90 BASE) MCG/ACT inhaler Inhale into the lungs every 6 (six) hours as needed for wheezing or shortness of breath. (Patient not taking: Reported on 12/04/2023)     fluticasone-salmeterol (ADVAIR HFA) 230-21 MCG/ACT inhaler Inhale 2 puffs into the lungs 2 (two) times daily. 1 each 3   fluticasone-salmeterol (ADVAIR HFA) 230-21 MCG/ACT inhaler Inhale 2 puffs into the lungs 2 (two) times daily. 1 each 12   No facility-administered medications prior to visit.   Review of Systems  Constitutional:  Negative for chills, fever, malaise/fatigue and weight loss.  HENT:  Negative for congestion, sinus pain and sore throat.   Eyes: Negative.   Respiratory:  Positive for shortness of breath.  Negative for cough, hemoptysis, sputum production and wheezing.   Cardiovascular:  Negative for chest pain, palpitations, orthopnea, claudication and leg swelling.  Gastrointestinal:  Negative for abdominal pain, heartburn, nausea and vomiting.  Genitourinary: Negative.   Musculoskeletal:  Positive for joint pain. Negative for myalgias.  Skin:  Negative for rash.  Neurological:  Negative for weakness.  Endo/Heme/Allergies:  Positive for environmental allergies.  Psychiatric/Behavioral: Negative.     Objective:   Vitals:   12/04/23 1045  BP: 110/78  Pulse: 68  Temp: 98 F (36.7 C)  TempSrc: Temporal  SpO2: 98%  Weight: 153 lb 9.6 oz (69.7 kg)  Height:  5\' 4"  (1.626 m)    Physical Exam Constitutional:      General: She is not in acute distress.    Appearance: She is not ill-appearing.  HENT:     Head: Normocephalic and atraumatic.  Eyes:     Conjunctiva/sclera: Conjunctivae normal.  Cardiovascular:     Rate and Rhythm: Normal rate and regular rhythm.     Pulses: Normal pulses.     Heart sounds: Normal heart sounds. No murmur heard. Pulmonary:     Effort: Pulmonary effort is normal.     Breath sounds: No wheezing, rhonchi or rales.  Musculoskeletal:     Right lower leg: No edema.     Left lower leg: No edema.  Skin:    General: Skin is warm and dry.  Neurological:     General: No focal deficit present.     Mental Status: She is alert.    CBC    Component Value Date/Time   WBC 8.6 09/25/2022 1118   RBC 4.88 09/25/2022 1118   HGB 13.2 09/25/2022 1118   HGB 13.8 07/15/2022 1400   HGB 13.3 07/08/2017 1225   HCT 38.5 09/25/2022 1118   HCT 38.3 07/08/2017 1225   PLT 286.0 09/25/2022 1118   PLT 326 07/15/2022 1400   PLT 293 07/08/2017 1225   MCV 78.8 09/25/2022 1118   MCV 78.4 (L) 07/08/2017 1225   MCH 27.2 07/15/2022 1400   MCHC 34.3 09/25/2022 1118   RDW 14.0 09/25/2022 1118   RDW 13.6 07/08/2017 1225   LYMPHSABS 1.6 09/25/2022 1118   LYMPHSABS 1.6  07/08/2017 1225   MONOABS 0.6 09/25/2022 1118   MONOABS 0.6 07/08/2017 1225   EOSABS 0.9 (H) 09/25/2022 1118   EOSABS 0.7 (H) 07/08/2017 1225   BASOSABS 0.1 09/25/2022 1118   BASOSABS 0.0 07/08/2017 1225      Latest Ref Rng & Units 07/15/2022    2:00 PM 07/16/2021   12:39 PM 12/11/2020   12:18 PM  BMP  Glucose 70 - 99 mg/dL 97  95  84   BUN 6 - 20 mg/dL 14  13  12    Creatinine 0.44 - 1.00 mg/dL 9.60  4.54  0.98   Sodium 135 - 145 mmol/L 139  141  140   Potassium 3.5 - 5.1 mmol/L 4.1  3.7  4.1   Chloride 98 - 111 mmol/L 103  104  102   CO2 22 - 32 mmol/L 31  28  28    Calcium 8.9 - 10.3 mg/dL 11.9  9.3  9.6    Chest imaging: CXR 05/15/22 The heart size and mediastinal contours are within normal limits. Both lungs are clear. The visualized skeletal structures are unremarkable.  PFT:    Latest Ref Rng & Units 09/27/2022    1:59 PM  PFT Results  FVC-Pre L 2.13   FVC-Predicted Pre % 64   FVC-Post L 2.12   FVC-Predicted Post % 64   Pre FEV1/FVC % % 78   Post FEV1/FCV % % 84   FEV1-Pre L 1.67   FEV1-Predicted Pre % 65   FEV1-Post L 1.78   DLCO uncorrected ml/min/mmHg 19.09   DLCO UNC% % 94   DLCO corrected ml/min/mmHg 19.09   DLCO COR %Predicted % 94   DLVA Predicted % 139   TLC L 4.38   TLC % Predicted % 86   RV % Predicted % 109     Labs:  Path:  Echo:  Heart Catheterization:  Assessment & Plan:  Moderate persistent asthma without complication - Plan: budesonide-formoterol (SYMBICORT) 160-4.5 MCG/ACT inhaler, montelukast (SINGULAIR) 10 MG tablet  Discussion: Jaquel Leister is a 62 year old woman, never smoker with history of left breast cancer, GERD and asthma who returns to pulmonary clinic for asthma.   Asthma Stable with occasional wheezing. Currently on Symbicort 160, 2 puffs twice daily, and Montelukast at night. Noted improvement with Montelukast. Discussed potential change to [facility]. -Continue Symbicort 160, 2 puffs twice daily. -Continue  Montelukast at night. -Provide samples of Breztri for trial. If tolerated and effective, consider switching from Symbicort to [place]. -Check formulary for Breztri coverage and initiate prior authorization process if necessary. -Refill Montelukast for 90 days.  Melody Comas, MD [facility] Office: [phone]   Current Outpatient Medications:    Ascorbic Acid (VITA-C PO), Take 1 tablet by mouth daily. , Disp: , Rfl:    CALCIUM PO, Take 600 mg by mouth daily. , Disp: , Rfl:    cetirizine (ZYRTEC) 10 MG tablet, Take 10 mg by mouth daily., Disp: , Rfl:    cholecalciferol (VITAMIN D) 1000 units tablet, Take 2,000 Units by mouth daily., Disp: , Rfl:    fluticasone (FLONASE) 50 MCG/ACT nasal spray, Place 2 sprays into both nostrils every morning., Disp: , Rfl:    ibuprofen (ADVIL,MOTRIN) 800 MG tablet, Take 1 tablet (800 mg total) by mouth every 8 (eight) hours as needed., Disp: 30 tablet, Rfl: 0   albuterol (PROVENTIL HFA;VENTOLIN HFA) 108 (90 BASE) MCG/ACT inhaler, Inhale into the lungs every 6 (six) hours as needed for wheezing or shortness of breath. (Patient not taking: Reported on 12/04/2023), Disp: , Rfl:    budesonide-formoterol (SYMBICORT) 160-4.5 MCG/ACT inhaler, Inhale 2 puffs into the lungs in the morning and at bedtime., Disp: 3 each, Rfl: 3   montelukast (SINGULAIR) 10 MG tablet, Take 1 tablet (10 mg total) by mouth at bedtime., Disp: 90 tablet, Rfl: 3

## 2023-12-16 ENCOUNTER — Ambulatory Visit (AMBULATORY_SURGERY_CENTER): Payer: 59

## 2023-12-16 VITALS — Ht 64.0 in | Wt 153.0 lb

## 2023-12-16 DIAGNOSIS — Z860100 Personal history of colon polyps, unspecified: Secondary | ICD-10-CM

## 2023-12-16 MED ORDER — NA SULFATE-K SULFATE-MG SULF 17.5-3.13-1.6 GM/177ML PO SOLN: 1.0000 | Freq: Once | ORAL | 0 refills | Status: AC

## 2023-12-16 NOTE — Progress Notes (Signed)
 No egg or soy allergy known to patient  PONV with past sedation with any surgeries or procedures Patient denies ever being told they had issues or difficulty with intubation  No FH of Malignant Hyperthermia Pt is not on diet pills Pt is not on  home 02  Pt is not on blood thinners  Pt denies issues with constipation  No A fib or A flutter Have any cardiac testing pending-- no  LOA: independent  Prep: suprep  Patient's chart reviewed by Cathlyn Parsons CNRA prior to previsit and patient appropriate for the [facility].  Previsit completed and red dot placed by patient's name on their procedure day (on provider's schedule).     PV competed with patient. Prep instructions sent via mychart and home address. Goodrx coupon for [facility] provided to use for price reduction if needed.

## 2024-01-05 ENCOUNTER — Other Ambulatory Visit: Payer: Self-pay | Admitting: Obstetrics and Gynecology

## 2024-01-05 DIAGNOSIS — Z1231 Encounter for screening mammogram for malignant neoplasm of breast: Secondary | ICD-10-CM

## 2024-01-08 ENCOUNTER — Encounter: Payer: Self-pay | Admitting: Internal Medicine

## 2024-01-13 ENCOUNTER — Ambulatory Visit (AMBULATORY_SURGERY_CENTER): Payer: 59 | Admitting: Internal Medicine

## 2024-01-13 ENCOUNTER — Encounter: Payer: Self-pay | Admitting: Internal Medicine

## 2024-01-13 VITALS — BP 114/67 | HR 84 | Temp 97.4°F | Resp 14 | Ht 64.0 in | Wt 154.0 lb

## 2024-01-13 DIAGNOSIS — D124 Benign neoplasm of descending colon: Secondary | ICD-10-CM

## 2024-01-13 DIAGNOSIS — Z1211 Encounter for screening for malignant neoplasm of colon: Secondary | ICD-10-CM | POA: Diagnosis present

## 2024-01-13 DIAGNOSIS — K6389 Other specified diseases of intestine: Secondary | ICD-10-CM

## 2024-01-13 DIAGNOSIS — Z860101 Personal history of adenomatous and serrated colon polyps: Secondary | ICD-10-CM

## 2024-01-13 DIAGNOSIS — Z860100 Personal history of colon polyps, unspecified: Secondary | ICD-10-CM

## 2024-01-13 DIAGNOSIS — D123 Benign neoplasm of transverse colon: Secondary | ICD-10-CM

## 2024-01-13 MED ORDER — SODIUM CHLORIDE 0.9 % IV SOLN: 500.0000 mL | Freq: Once | INTRAVENOUS | Status: DC

## 2024-01-13 NOTE — Op Note (Signed)
 [facility] Patient Name: Cynthia Ford Procedure Date: 01/13/2024 9:11 AM MRN: [id number] Endoscopist: Norleen SAILOR. Abran , MD, [id number] Age: 63 Referring MD:  Date of Birth: 03/10/1961 Gender: Female Account #: [id number] Procedure:                Colonoscopy with cold snare polypectomy x 1; biopsy                            polypectomy x 1 Indications:              High risk colon cancer surveillance: Personal                            history of non-advanced adenomas. Previous                            examination 2019 (Dr. Teressa) Medicines:                Monitored Anesthesia Care Procedure:                Pre-Anesthesia Assessment:                           - Prior to the procedure, a History and Physical                            was performed, and patient medications and                            allergies were reviewed. The patient's tolerance of                            previous anesthesia was also reviewed. The risks                            and benefits of the procedure and the sedation                            options and risks were discussed with the patient.                            All questions were answered, and informed consent                            was obtained. Prior Anticoagulants: The patient has                            taken no anticoagulant or antiplatelet agents. ASA                            Grade Assessment: II - A patient with mild systemic                            disease. After reviewing the risks and benefits,  the patient was deemed in satisfactory condition to                            undergo the procedure.                           After obtaining informed consent, the colonoscope                            was passed under direct vision. Throughout the                            procedure, the patient's blood pressure, pulse, and                            oxygen saturations were monitored  continuously. The                            Olympus Scope SN: [id number] was introduced through                            the anus and advanced to the the cecum, identified                            by appendiceal orifice and ileocecal valve. The                            ileocecal valve, appendiceal orifice, and rectum                            were photographed. The quality of the bowel                            preparation was excellent. The colonoscopy was                            performed without difficulty. The patient tolerated                            the procedure well. The bowel preparation used was                            SUPREP via split dose instruction. Scope In: 9:23:19 AM Scope Out: 9:37:09 AM Scope Withdrawal Time: 0 hours 8 minutes 2 seconds  Total Procedure Duration: 0 hours 13 minutes 50 seconds  Findings:                 A 5 mm polyp was found in the transverse colon. The                            polyp was removed with a cold snare. Resection and                            retrieval were complete.  A 1 mm polyp was found in the descending colon. The                            polyp was removed with a jumbo cold forceps.                            Resection and retrieval were complete.                           The exam was otherwise without abnormality on                            direct and retroflexion views. Complications:            No immediate complications. Estimated blood loss:                            None. Estimated Blood Loss:     Estimated blood loss: none. Impression:               - One 5 mm polyp in the transverse colon, removed                            with a cold snare. Resected and retrieved.                           - One 1 mm polyp in the descending colon, removed                            with a jumbo cold forceps. Resected and retrieved.                           - The examination was otherwise normal  on direct                            and retroflexion views. Recommendation:           - Repeat colonoscopy in 5 years for surveillance.                           - Patient has a contact number available for                            emergencies. The signs and symptoms of potential                            delayed complications were discussed with the                            patient. Return to normal activities tomorrow.                            Written discharge instructions were provided to the  patient.                           - Resume previous diet.                           - Continue present medications.                           - Await pathology results. Norleen SAILOR. Abran, MD 01/13/2024 9:44:25 AM This report has been signed electronically.

## 2024-01-13 NOTE — Patient Instructions (Signed)
-  Handout on polyps provided -await pathology results -repeat colonoscopy in 5 years or surveillance recommended. -Continue present medications   YOU HAD AN ENDOSCOPIC PROCEDURE TODAY AT THE [facility]:   Refer to the procedure report that was given to you for any specific questions about what was found during the examination.  If the procedure report does not answer your questions, please call your gastroenterologist to clarify.  If you requested that your care partner not be given the details of your procedure findings, then the procedure report has been included in a sealed envelope for you to review at your convenience later.  YOU SHOULD EXPECT: Some feelings of bloating in the abdomen. Passage of more gas than usual.  Walking can help get rid of the air that was put into your GI tract during the procedure and reduce the bloating. If you had a lower endoscopy (such as a colonoscopy or flexible sigmoidoscopy) you may notice spotting of blood in your stool or on the toilet paper. If you underwent a bowel prep for your procedure, you may not have a normal bowel movement for a few days.  Please Note:  You might notice some irritation and congestion in your nose or some drainage.  This is from the oxygen used during your procedure.  There is no need for concern and it should clear up in a day or so.  SYMPTOMS TO REPORT IMMEDIATELY:  Following lower endoscopy (colonoscopy or flexible sigmoidoscopy):  Excessive amounts of blood in the stool  Significant tenderness or worsening of abdominal pains  Swelling of the abdomen that is new, acute  Fever of 100F or higher   For urgent or emergent issues, a gastroenterologist can be reached at any hour by calling [phone]. Do not use MyChart messaging for urgent concerns.    DIET:  We do recommend a small meal at first, but then you may proceed to your regular diet.  Drink plenty of fluids but you should avoid alcoholic beverages for  24 hours.  ACTIVITY:  You should plan to take it easy for the rest of today and you should NOT DRIVE or use heavy machinery until tomorrow (because of the sedation medicines used during the test).    FOLLOW UP: Our staff will call the number listed on your records the next business day following your procedure.  We will call around 7:15- 8:00 am to check on you and address any questions or concerns that you may have regarding the information given to you following your procedure. If we do not reach you, we will leave a message.     If any biopsies were taken you will be contacted by phone or by letter within the next 1-3 weeks.  Please call us  at [phone] if you have not heard about the biopsies in 3 weeks.    SIGNATURES/CONFIDENTIALITY: You and/or your care partner have signed paperwork which will be entered into your electronic medical record.  These signatures attest to the fact that that the information above on your After Visit Summary has been reviewed and is understood.  Full responsibility of the confidentiality of this discharge information lies with you and/or your care-partner.

## 2024-01-13 NOTE — Progress Notes (Signed)
 Called to room to assist during endoscopic procedure.  Patient ID and intended procedure confirmed with present staff. Received instructions for my participation in the procedure from the performing physician.

## 2024-01-13 NOTE — Progress Notes (Signed)
 Pt's states no medical or surgical changes since previsit or office visit.

## 2024-01-13 NOTE — Progress Notes (Signed)
 HISTORY OF PRESENT ILLNESS:  Cynthia Ford is a 63 y.o. female with a history of adenomatous colon polyps.  Now for surveillance colonoscopy.  Previous examination 2019 with Dr. Teressa  REVIEW OF SYSTEMS:  All non-GI ROS negative except for  Past Medical History:  Diagnosis Date   Allergy     Anemia    Anxiety    Asthma    Breast cancer (HCC) 2016   Left Breast Cancer   GERD (gastroesophageal reflux disease)    Papilloma of left breast    Personal history of radiation therapy    PONV (postoperative nausea and vomiting)    Salivary gland infection 11/2019    Past Surgical History:  Procedure Laterality Date   BREAST BIOPSY Right 08/18/2020   BREAST EXCISIONAL BIOPSY Right 02/17/2018   BREAST EXCISIONAL BIOPSY Right 12/13/2020   BREAST LUMPECTOMY Left 01/2015   BREAST LUMPECTOMY WITH RADIOACTIVE SEED LOCALIZATION Left 02/14/2015   Procedure: BREAST LUMPECTOMY WITH RADIOACTIVE SEED LOCALIZATION;  Surgeon: Debby Shipper, MD;  Location: [facility];  Service: General;  Laterality: Left;   BREAST LUMPECTOMY WITH RADIOACTIVE SEED LOCALIZATION Right 02/17/2018   Procedure: BREAST LUMPECTOMY WITH RADIOACTIVE SEED LOCALIZATION;  Surgeon: Shipper Debby, MD;  Location: [facility];  Service: General;  Laterality: Right;   BREAST LUMPECTOMY WITH RADIOACTIVE SEED LOCALIZATION Right 12/13/2020   Procedure: RIGHT BREAST LUMPECTOMY WITH RADIOACTIVE SEED LOCALIZATION;  Surgeon: Shipper Debby, MD;  Location: [facility];  Service: General;  Laterality: Right;   BREAST SURGERY  2010   LEFT   CHOLECYSTECTOMY  2001   KNEE ARTHROSCOPY Left 1999/1993   NASAL PASSAGE  2000   NASAL SEPTUM SURGERY     TYMPANOSTOMY TUBE PLACEMENT  12/2018    Social History Cynthia Ford  reports that she has never smoked. She has never used smokeless tobacco. She reports that she does not drink alcohol and does not use drugs.  family history includes Breast cancer  (age of onset: 7) in an other family member; Colon cancer in her maternal grandmother; Colon polyps in her mother and sister; Prostate cancer (age of onset: 66) in her brother; Prostate cancer (age of onset: 33) in her brother; Prostate cancer (age of onset: 46.) in her maternal grandfather; Rectal cancer in her paternal aunt.  Allergies  Allergen Reactions   Grass Pollen(K-O-R-T-Swt Vern) Shortness Of Breath    Other Reaction(s): Cough   Other     Tree and shrub pollen: headache, itching, wheezing  Animal dander: Headache, itching    Raspberry Itching    Other Reaction(s): eye redness, headache, respiratory distress   Dog Epithelium (Canis Lupus Familiaris)     Other Reaction(s): Cough       PHYSICAL EXAMINATION: Vital signs: BP 124/73   Pulse 78   Temp (!) 97.4 F (36.3 C)   Ht 5' 4 (1.626 m)   Wt 154 lb (69.9 kg)   SpO2 98%   BMI 26.43 kg/m  General: Well-developed, well-nourished, no acute distress HEENT: Sclerae are anicteric, conjunctiva pink. Oral mucosa intact Lungs: Clear Heart: Regular Abdomen: soft, nontender, nondistended, no obvious ascites, no peritoneal signs, normal bowel sounds. No organomegaly. Extremities: No edema Psychiatric: alert and oriented x3. Cooperative     ASSESSMENT:  History of adenomatous polyps   PLAN:  Surveillance colonoscopy

## 2024-01-13 NOTE — Progress Notes (Signed)
 Report to PACU, RN, vss, BBS= Clear.

## 2024-01-14 ENCOUNTER — Telehealth: Payer: Self-pay

## 2024-01-14 NOTE — Telephone Encounter (Signed)
  Follow up Call-     01/13/2024    8:35 AM  Call back number  Post procedure Call Back phone  # [phone]  Permission to leave phone message Yes     Patient questions:  Do you have a fever, pain , or abdominal swelling? No. Pain Score  0 *  Have you tolerated food without any problems? Yes.    Have you been able to return to your normal activities? Yes.    Do you have any questions about your discharge instructions: Diet   No. Medications  No. Follow up visit  No.  Do you have questions or concerns about your Care? No.  Actions: * If pain score is 4 or above: No action needed, pain <4.

## 2024-01-15 LAB — SURGICAL PATHOLOGY

## 2024-01-16 ENCOUNTER — Encounter: Payer: Self-pay | Admitting: Internal Medicine

## 2024-02-06 ENCOUNTER — Other Ambulatory Visit (HOSPITAL_COMMUNITY): Payer: Self-pay

## 2024-02-10 ENCOUNTER — Telehealth: Payer: Self-pay

## 2024-02-10 ENCOUNTER — Other Ambulatory Visit (HOSPITAL_COMMUNITY): Payer: Self-pay

## 2024-02-10 DIAGNOSIS — J4540 Moderate persistent asthma, uncomplicated: Secondary | ICD-10-CM

## 2024-02-10 NOTE — Telephone Encounter (Signed)
*  Pulm  Pharmacy Patient Advocate Encounter   Received notification from CoverMyMeds that prior authorization for Symbicort 160-4.5MCG/ACT aerosol  is required/requested.   Insurance verification completed.   The patient is insured through [facility] .   Per test claim: PA required; PA submitted to above mentioned insurance via CoverMyMeds Key/confirmation #/EOC [id number] Status is pending   *this is a renewal PA- med is covered and refill too soon at this time (02/10/2024)

## 2024-02-10 NOTE — Telephone Encounter (Signed)
*  Pulm  Pharmacy Patient Advocate Encounter   Received notification from CoverMyMeds that prior authorization for Symbicort 160-4.5MCG/ACT aerosol  is required/requested.   Insurance verification completed.   The patient is insured through [facility] .   Per test claim: Refill too soon. PA is not needed at this time. Medication was filled 12/18/2023. Next eligible fill date is 02/24/2024.

## 2024-02-16 NOTE — Telephone Encounter (Signed)
Pharmacy Patient Advocate Encounter  Received notification from [facility] that Prior Authorization for Symbicort has been DENIED.  Full denial letter will be uploaded to the media tab. See denial reason below.   PA #/Case ID/Reference #: Why your request was denied: Your plan only covers this drug when you meet one of these options: A) You have tried other drugs your plan covers (preferred drugs), and they did not work well for you, or B) Your doctor gives [place] a medical reason you cannot take those other drugs. For your plan, you may need to try up to three preferred drugs. We have denied your request because you do not meet any of these conditions. We reviewed the information we had. Your request has been denied. Your doctor can send [place] any new or missing information for [place] to review. The preferred drugs for your plan are: budesonide-formoterol, fluticasone-salmeterol ([id number], [id number]), Jerral Ralph, Wixela Inhub, BREO ELLIPTA (except certain NDCs) [Requirement: 3 in a class with 3 or more alternatives, 2 in a class with 2 alternatives, or 1 in a class with only 1 alternative]. Your doctor may need to get approval from your plan for preferred drugs. For this drug, you may have to meet other criteria. You can request the drug policy for more details. You can also request other plan documents for your review.

## 2024-02-17 ENCOUNTER — Ambulatory Visit
Admission: RE | Admit: 2024-02-17 | Discharge: 2024-02-17 | Disposition: A | Discharge status: Home / Self Care | Payer: 59 | Source: Ambulatory Visit | Attending: Obstetrics and Gynecology | Admitting: Obstetrics and Gynecology

## 2024-02-17 DIAGNOSIS — Z1231 Encounter for screening mammogram for malignant neoplasm of breast: Secondary | ICD-10-CM

## 2024-02-18 MED ORDER — BUDESONIDE-FORMOTEROL FUMARATE 160-4.5 MCG/ACT IN AERO
2.0000 | INHALATION_SPRAY | Freq: Two times a day (BID) | RESPIRATORY_TRACT | 12 refills | Status: DC
Start: 2024-02-18 — End: 2024-11-18

## 2024-02-18 NOTE — Telephone Encounter (Signed)
Breyna inhaler sent in to replace her Symbicort inhaler.  Dr. Francine Graven

## 2024-02-18 NOTE — Telephone Encounter (Signed)
Dr. Francine Graven, please see below message and advise. Thanks

## 2024-02-18 NOTE — Addendum Note (Signed)
Addended by: Melody Comas on: 02/18/2024 03:10 PM   Modules accepted: Orders

## 2024-02-19 NOTE — Telephone Encounter (Signed)
 Lm for patient.

## 2024-02-20 NOTE — Telephone Encounter (Signed)
Called and spoke with patient, provided information regarding inhaler per Dr. Francine Graven.  She stated that she did not want to try another inhaler, she has tried all different types of inhalers for her asthma and she has finally found one that works and does not want to change.  I provided the information in the PA, she said that she was told by someone at [facility] that they always reject it the first time, but if you submit it again, they will approve it.  I let her know I would send a message back the PA team and Dr. Francine Graven.  She verbalized understanding.   PA team, please submit request again.  Patient is convinced that the first submission is always rejected and if it is submitted again it will be approved despite all the explanations provided.  Thank you.

## 2024-02-23 ENCOUNTER — Other Ambulatory Visit (HOSPITAL_COMMUNITY): Payer: Self-pay

## 2024-02-23 NOTE — Telephone Encounter (Signed)
 Patient states Symbicort was denied She was told an appeal would need to be submitted. She doesn't want Jerral Ralph and she has tried many other inhalers as she has had asthma for 40 years.

## 2024-02-23 NOTE — Telephone Encounter (Signed)
 Cynthia Ford is a generic version of Symbicort- the medication is still covered as of now and is showing as refill too soon due to the most recent fill.

## 2024-02-24 NOTE — Telephone Encounter (Signed)
 [facility] mos supply in Dec. The nexr fill date is 03/13/24. Patient states she received a letter from insurance stating Symbicort brand was not going to be covered. Asked patient to bring letter by office before next months office visit so that pharmacy team could look into issue. Patient verbalized understanding.

## 2024-02-24 NOTE — Telephone Encounter (Signed)
 Does patient have the letter or whatever they received showing Symbicort was not going to be covered? Per test claims it is showing as still covered and too soon to refill due to when she last filled the medication.

## 2024-02-26 ENCOUNTER — Telehealth: Payer: Self-pay | Admitting: Pulmonary Disease

## 2024-02-26 NOTE — Telephone Encounter (Signed)
 PT dropped off a [facility] [employer] form. She told me to put it in the Pharm box. I put it in the "Refill Box"

## 2024-02-27 NOTE — Telephone Encounter (Signed)
 Letter states that Brand Symbicort will no longer be covered- letter still lists generic Symbicort will still be covered.

## 2024-03-18 ENCOUNTER — Ambulatory Visit: Payer: 59 | Admitting: Pulmonary Disease

## 2024-03-18 ENCOUNTER — Encounter: Payer: Self-pay | Admitting: Pulmonary Disease

## 2024-03-18 VITALS — BP 122/72 | HR 78 | Ht 64.0 in | Wt 148.4 lb

## 2024-03-18 DIAGNOSIS — J4540 Moderate persistent asthma, uncomplicated: Secondary | ICD-10-CM

## 2024-03-18 NOTE — Patient Instructions (Addendum)
 We will check IgE and CBC with differential labs, you can come back next week to do them, call before coming in to check on lab tech availability  We will schedule you for pulmonary function test  Continue with Breyna inhaler 2 puffs twice daily - rinse mouth out after each use - if breyna is not as effective as symbicort, we will work on prior auth to get symbicort ready  Continue albuterol inhaler as needed  Continue montelukast daily  Continue flonase daily  We will work on [employer] paper work today and our pharmacy team will reach out about starting this medication  Follow up in 4 months

## 2024-03-18 NOTE — Progress Notes (Signed)
 Synopsis: Referred in September 2023 for asthma by Cynthia Bussing, MD  Subjective:   PATIENT ID: Cynthia Ford GENDER: female DOB: Jan 10, 1961, MRN: [id number]  HPI  Chief Complaint  Patient presents with   Follow-up   Cynthia Ford is a 63 year old woman, never smoker with history of left breast cancer, GERD and asthma who returns to pulmonary clinic for asthma.   Initial OV 09/25/22 She reports being diagnosed with asthma in the early 2000s. She reports significant seasonal allergies and allergies to pet dander. She has 2 dogs at home. Spring and fall seasons are difficult for her breathing. She is currently using symbicort 80-4.76mcg 2 puffs twice daily and singulair daily. She reports improvement in symptoms since starting singulair a few months ago. She is taking zyrtec for allergies. She wakes up 2 nights per week due to cough, wheezing or dyspnea.   She had no issues in childhood with her breathing. She played highschool sports. She was exposed to second hand smoke from her mother. Her mother had asthma/copd. 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 Her breathing worsened 5 years into her work. 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 She retired in 2020.  She has carpet in the bedrooms of her homes. The dogs are in her bedroom and stay in there at night.    OV 12/05/22 She was started on symbicort 160-4.2mcg 2 puffs twice daily at last visit. She has been feeling well since last visit. She reports her symbicort will not be covered in 2024 by her insurance company. She does not tolerate dry powder inhalers due to infections.   Labs showed absolute eosinophil count of 900 and IgE 78.   PFTs show non-specific pulmonary function pattern with reduced FEV1 and FVC but normal TLC.    OV 12/04/23 The patient, with a history of asthma, presents with wheezing  and difficulty adhering to their exercise program. They report that their symptoms are well controlled with Symbicort 160, taken two puffs twice daily, and montelukast at night. They note that if they delay their Symbicort dose by more than twelve hours, they notice a difference in their breathing. They also report that the montelukast causes coughing initially, but after it settles down, it seems to really help with their breathing. They have not been waking up at night due to cough, wheezing, or shortness of breath in the last four weeks. They also mention that they have been using air purifiers at night, which they believe may be helping with their coughing.  ACT 23  OV 03/18/24 She is experiencing difficulty obtaining prior authorization for her symbicort inhaler medication. Her initial request was denied, and she brought the denial papers to the office about three weeks ago.   She has been using Symbicort, which has recently gone generic to [place]. She is concerned about the effectiveness of Breyna compared to Symbicort, noting that other inhalers have not worked as well for her in the past. She tried Clinical cytogeneticist, which caused a burning sensation in her lungs, leading to discontinuation after a week due to discomfort. Her current medications include Symbicort and montelukast. If she is late by about three hours on her twelve-hour schedule for Symbicort, she can feel the difference, indicating its necessity for her condition.  Her breathing has been stable since December, but she reports increased allergy symptoms  over the past week and a half, including coughing, sniffling, and rhinorrhea. She has a history of elevated eosinophil levels. She has not needed prednisone in the last year and a half. At night, she sometimes wakes up coughing, but not with wheezing or dyspnea.  She experiences dyspnea when walking her dog, which she attributes to lack of exercise, but notes improvement with increased  activity.  Past Medical History:  Diagnosis Date   Allergy    Anemia    Anxiety    Asthma    Breast cancer (HCC) 2016   Left Breast Cancer   GERD (gastroesophageal reflux disease)    Papilloma of left breast    Personal history of radiation therapy    PONV (postoperative nausea and vomiting)    Salivary gland infection 11/2019     Family History  Problem Relation Age of Onset   Breast cancer Other 10       niece   Prostate cancer Brother 38   Prostate cancer Brother 7   Prostate cancer Maternal Grandfather 36.   Colon polyps Mother    Colon polyps Sister    Rectal cancer Paternal Aunt    Colon cancer Maternal Grandmother    Esophageal cancer Neg Hx    Stomach cancer Neg Hx      Social History   Socioeconomic History   Marital status: Media planner    Spouse name: Not on file   Number of children: 0   Years of education: Not on file   Highest education level: Not on file  Occupational History   Occupation: Gaffer  Tobacco Use   Smoking status: Never   Smokeless tobacco: Never  Vaping Use   Vaping status: Never Used  Substance and Sexual Activity   Alcohol use: No    Alcohol/week: 0.0 standard drinks of alcohol   Drug use: No   Sexual activity: Yes    Birth control/protection: Post-menopausal  Other Topics Concern   Not on file  Social History Narrative   Not on file   Social Drivers of Health   Financial Resource Strain: Not on file  Food Insecurity: Not on file  Transportation Needs: Not on file  Physical Activity: Not on file  Stress: Not on file  Social Connections: Not on file  Intimate Partner Violence: Not on file     Allergies  Allergen Reactions   Grass Pollen(K-O-R-T-Swt Vern) Shortness Of Breath    Other Reaction(s): Cough   Other     Tree and shrub pollen: headache, itching, wheezing  Animal dander: Headache, itching    Raspberry Itching    Other Reaction(s): eye redness, headache, respiratory distress   Dog  Epithelium (Canis Lupus Familiaris)     Other Reaction(s): Cough     Outpatient Medications Prior to Visit  Medication Sig Dispense Refill   albuterol (PROVENTIL HFA;VENTOLIN HFA) 108 (90 BASE) MCG/ACT inhaler Inhale into the lungs every 6 (six) hours as needed for wheezing or shortness of breath.     Ascorbic Acid (VITA-C PO) Take 1 tablet by mouth daily.      CALCIUM PO Take 600 mg by mouth daily.      cetirizine (ZYRTEC) 10 MG tablet Take 10 mg by mouth daily.     cholecalciferol (VITAMIN D) 1000 units tablet Take 2,000 Units by mouth daily.     ciprofloxacin-dexamethasone (CIPRODEX) OTIC suspension 4 drops as needed.     fluticasone (FLONASE) 50 MCG/ACT nasal spray Place 2 sprays into both nostrils  every morning.     ibuprofen (ADVIL,MOTRIN) 800 MG tablet Take 1 tablet (800 mg total) by mouth every 8 (eight) hours as needed. 30 tablet 0   montelukast (SINGULAIR) 10 MG tablet Take 1 tablet (10 mg total) by mouth at bedtime. 90 tablet 3   budesonide-formoterol (BREYNA) 160-4.5 MCG/ACT inhaler Inhale 2 puffs into the lungs 2 (two) times daily. 1 each 12   No facility-administered medications prior to visit.   Review of Systems  Constitutional:  Negative for chills, fever, malaise/fatigue and weight loss.  HENT:  Negative for congestion, sinus pain and sore throat.   Eyes: Negative.   Respiratory:  Positive for shortness of breath. Negative for cough, hemoptysis, sputum production and wheezing.   Cardiovascular:  Negative for chest pain, palpitations, orthopnea, claudication and leg swelling.  Gastrointestinal:  Negative for abdominal pain, heartburn, nausea and vomiting.  Genitourinary: Negative.   Musculoskeletal:  Positive for joint pain. Negative for myalgias.  Skin:  Negative for rash.  Neurological:  Negative for weakness.  Endo/Heme/Allergies:  Positive for environmental allergies.  Psychiatric/Behavioral: Negative.     Objective:   Vitals:   03/18/24 1315  BP: 122/72   Pulse: 78  SpO2: 99%  Weight: 148 lb 6.4 oz (67.3 kg)  Height: 5\' 4"  (1.626 m)     Physical Exam Constitutional:      General: She is not in acute distress.    Appearance: She is not ill-appearing.  HENT:     Head: Normocephalic and atraumatic.  Eyes:     Conjunctiva/sclera: Conjunctivae normal.  Cardiovascular:     Rate and Rhythm: Normal rate and regular rhythm.     Pulses: Normal pulses.     Heart sounds: Normal heart sounds. No murmur heard. Pulmonary:     Effort: Pulmonary effort is normal.     Breath sounds: No wheezing, rhonchi or rales.  Musculoskeletal:     Right lower leg: No edema.     Left lower leg: No edema.  Skin:    General: Skin is warm and dry.  Neurological:     General: No focal deficit present.     Mental Status: She is alert.    CBC    Component Value Date/Time   WBC 8.6 09/25/2022 1118   RBC 4.88 09/25/2022 1118   HGB 13.2 09/25/2022 1118   HGB 13.8 07/15/2022 1400   HGB 13.3 07/08/2017 1225   HCT 38.5 09/25/2022 1118   HCT 38.3 07/08/2017 1225   PLT 286.0 09/25/2022 1118   PLT 326 07/15/2022 1400   PLT 293 07/08/2017 1225   MCV 78.8 09/25/2022 1118   MCV 78.4 (L) 07/08/2017 1225   MCH 27.2 07/15/2022 1400   MCHC 34.3 09/25/2022 1118   RDW 14.0 09/25/2022 1118   RDW 13.6 07/08/2017 1225   LYMPHSABS 1.6 09/25/2022 1118   LYMPHSABS 1.6 07/08/2017 1225   MONOABS 0.6 09/25/2022 1118   MONOABS 0.6 07/08/2017 1225   EOSABS 0.9 (H) 09/25/2022 1118   EOSABS 0.7 (H) 07/08/2017 1225   BASOSABS 0.1 09/25/2022 1118   BASOSABS 0.0 07/08/2017 1225      Latest Ref Rng & Units 07/15/2022    2:00 PM 07/16/2021   12:39 PM 12/11/2020   12:18 PM  BMP  Glucose 70 - 99 mg/dL 97  95  84   BUN 6 - 20 mg/dL 14  13  12    Creatinine 0.44 - 1.00 mg/dL 2.13  0.86  5.78   Sodium 135 - 145 mmol/L 139  141  140   Potassium 3.5 - 5.1 mmol/L 4.1  3.7  4.1   Chloride 98 - 111 mmol/L 103  104  102   CO2 22 - 32 mmol/L 31  28  28    Calcium 8.9 - 10.3 mg/dL  41.3  9.3  9.6    Chest imaging: CXR 05/15/22 The heart size and mediastinal contours are within normal limits. Both lungs are clear. The visualized skeletal structures are unremarkable.  PFT:    Latest Ref Rng & Units 09/27/2022    1:59 PM  PFT Results  FVC-Pre L 2.13   FVC-Predicted Pre % 64   FVC-Post L 2.12   FVC-Predicted Post % 64   Pre FEV1/FVC % % 78   Post FEV1/FCV % % 84   FEV1-Pre L 1.67   FEV1-Predicted Pre % 65   FEV1-Post L 1.78   DLCO uncorrected ml/min/mmHg 19.09   DLCO UNC% % 94   DLCO corrected ml/min/mmHg 19.09   DLCO COR %Predicted % 94   DLVA Predicted % 139   TLC L 4.38   TLC % Predicted % 86   RV % Predicted % 109     Labs:  Path:  Echo:  Heart Catheterization:  Assessment & Plan:   Moderate persistent asthma without complication - Plan: IgE, CBC with Differential/Platelet  Discussion: Bryauna Byrum is a 63 year old woman, never smoker with history of left breast cancer, GERD and asthma who returns to pulmonary clinic for asthma.   Asthma Stable with occasional wheezing. Currently on Symbicort 160, 2 puffs twice daily, and Montelukast at night. Did not notice benefit from breztri inhaler. -Continue Symbicort 160, 2 puffs twice daily and transition to [place] inhaler - If Breyna not effective, will work with insurance about covering Symbicort -Continue Montelukast at night. -Continue montelukast 10mg  daily - Will work on getting her approved for dupixent - Check IgE, CBC with diff and update PFTs  Follow up in 4 months  Melody Comas, MD [facility] Office: [phone]   Current Outpatient Medications:    albuterol (PROVENTIL HFA;VENTOLIN HFA) 108 (90 BASE) MCG/ACT inhaler, Inhale into the lungs every 6 (six) hours as needed for wheezing or shortness of breath., Disp: , Rfl:    Ascorbic Acid (VITA-C PO), Take 1 tablet by mouth daily. , Disp: , Rfl:    CALCIUM PO, Take 600 mg by mouth daily. , Disp: , Rfl:     cetirizine (ZYRTEC) 10 MG tablet, Take 10 mg by mouth daily., Disp: , Rfl:    cholecalciferol (VITAMIN D) 1000 units tablet, Take 2,000 Units by mouth daily., Disp: , Rfl:    ciprofloxacin-dexamethasone (CIPRODEX) OTIC suspension, 4 drops as needed., Disp: , Rfl:    fluticasone (FLONASE) 50 MCG/ACT nasal spray, Place 2 sprays into both nostrils every morning., Disp: , Rfl:    ibuprofen (ADVIL,MOTRIN) 800 MG tablet, Take 1 tablet (800 mg total) by mouth every 8 (eight) hours as needed., Disp: 30 tablet, Rfl: 0   montelukast (SINGULAIR) 10 MG tablet, Take 1 tablet (10 mg total) by mouth at bedtime., Disp: 90 tablet, Rfl: 3   budesonide-formoterol (BREYNA) 160-4.5 MCG/ACT inhaler, Inhale 2 puffs into the lungs 2 (two) times daily., Disp: 1 each, Rfl: 12

## 2024-03-19 ENCOUNTER — Telehealth: Payer: Self-pay

## 2024-03-19 NOTE — Telephone Encounter (Signed)
 Additional clinical questions populated in [facility], completed and submitted with additional documentation (including labs). Will await further correspondence.

## 2024-03-19 NOTE — Telephone Encounter (Signed)
 Received New start paperwork for DUPIXENT. Will update as we work through the benefits process.  Submitted a Prior Authorization request to [facility] for DUPIXENT via CoverMyMeds. Will update once we receive a response.  Key: [id number]  Paperwork placed in "PAP Pending" folder.

## 2024-03-24 ENCOUNTER — Other Ambulatory Visit (HOSPITAL_COMMUNITY): Payer: Self-pay

## 2024-03-24 LAB — CBC WITH DIFFERENTIAL/PLATELET
Basophils Absolute: 0.1 10*3/uL (ref 0.0–0.1)
Basophils Relative: 0.6 % (ref 0.0–3.0)
Eosinophils Absolute: 0.4 10*3/uL (ref 0.0–0.7)
Eosinophils Relative: 5 % (ref 0.0–5.0)
HCT: 36.8 % (ref 36.0–46.0)
Hemoglobin: 12.6 g/dL (ref 12.0–15.0)
Lymphocytes Relative: 18.2 % (ref 12.0–46.0)
Lymphs Abs: 1.4 10*3/uL (ref 0.7–4.0)
MCHC: 34.3 g/dL (ref 30.0–36.0)
MCV: 79.6 fl (ref 78.0–100.0)
Monocytes Absolute: 0.6 10*3/uL (ref 0.1–1.0)
Monocytes Relative: 7.4 % (ref 3.0–12.0)
Neutro Abs: 5.3 10*3/uL (ref 1.4–7.7)
Neutrophils Relative %: 68.8 % (ref 43.0–77.0)
Platelets: 350 10*3/uL (ref 150.0–400.0)
RBC: 4.62 Mil/uL (ref 3.87–5.11)
RDW: 13.5 % (ref 11.5–15.5)
WBC: 7.7 10*3/uL (ref 4.0–10.5)

## 2024-03-24 NOTE — Telephone Encounter (Signed)
 Received notification from [facility] regarding a prior authorization for DUPIXENT. Authorization has been APPROVED from 03/21/24 to 09/21/24. Approval letter sent to scan center.  Patient must fill through [facility]: [phone]  Authorization # [id number]  New start visit pending copay card activation:

## 2024-03-25 LAB — IGE: IgE (Immunoglobulin E), Serum: 161 kU/L — ABNORMAL HIGH (ref <=114)

## 2024-03-25 NOTE — Telephone Encounter (Addendum)
 Activated Dupixent copay card via phone: ID: [id number] BIN: [id number] PCN: [id number] Group: [id number]  Called patient to schedule for Dupixent new start.  She states she'd like to wait until her labs come back before scheduling. States "Dupixent was not yet decided on."  Chesley Mires, PharmD, MPH, BCPS, CPP Clinical Pharmacist (Rheumatology and Pulmonology)

## 2024-03-25 NOTE — Telephone Encounter (Signed)
 See 2/11 tel encounter.

## 2024-04-12 ENCOUNTER — Encounter: Payer: Self-pay | Admitting: Pulmonary Disease

## 2024-05-04 NOTE — Telephone Encounter (Signed)
 Called patient to discuss initiation of Dupixent if interested. Left VM with my callback number if interested  Geraldene Kleine, PharmD, MPH, BCPS, CPP Clinical Pharmacist (Rheumatology and Pulmonology)

## 2024-06-07 ENCOUNTER — Telehealth: Payer: Self-pay | Admitting: *Deleted

## 2024-06-07 NOTE — Telephone Encounter (Signed)
 Copied from CRM [id number]. Topic: General - Other >> Jun 03, 2024  5:01 PM [place] S wrote: Reason for CRM: patient needs to know what type of labs dr dewald would like her to come in for next week   Called the pt. She states that she had called about her appt that she had for this wk. She thought it was for labs. It was actually her PFT appt. She has since had to reschedule this appt. Nothing further needed.

## 2024-06-09 ENCOUNTER — Encounter

## 2024-07-12 ENCOUNTER — Other Ambulatory Visit: Payer: Self-pay | Admitting: *Deleted

## 2024-07-12 DIAGNOSIS — J4540 Moderate persistent asthma, uncomplicated: Secondary | ICD-10-CM

## 2024-07-13 ENCOUNTER — Ambulatory Visit: Admitting: Pulmonary Disease

## 2024-07-13 DIAGNOSIS — J4540 Moderate persistent asthma, uncomplicated: Secondary | ICD-10-CM

## 2024-07-13 LAB — PULMONARY FUNCTION TEST
DL/VA % pred: 117 %
DL/VA: 4.94 ml/min/mmHg/L
DLCO cor % pred: 92 %
DLCO cor: 18.52 ml/min/mmHg
DLCO unc % pred: 92 %
DLCO unc: 18.52 ml/min/mmHg
FEF 25-75 Post: 2.68 L/s
FEF 25-75 Pre: 2.21 L/s
FEF2575-%Change-Post: 21 %
FEF2575-%Pred-Post: 118 %
FEF2575-%Pred-Pre: 97 %
FEV1-%Change-Post: 3 %
FEV1-%Pred-Post: 85 %
FEV1-%Pred-Pre: 83 %
FEV1-Post: 2.15 L
FEV1-Pre: 2.08 L
FEV1FVC-%Change-Post: 0 %
FEV1FVC-%Pred-Pre: 106 %
FEV6-%Change-Post: 1 %
FEV6-%Pred-Post: 82 %
FEV6-%Pred-Pre: 80 %
FEV6-Post: 2.57 L
FEV6-Pre: 2.52 L
FEV6FVC-%Change-Post: 0 %
FEV6FVC-%Pred-Post: 103 %
FEV6FVC-%Pred-Pre: 103 %
FVC-%Change-Post: 2 %
FVC-%Pred-Post: 79 %
FVC-%Pred-Pre: 77 %
FVC-Post: 2.58 L
FVC-Pre: 2.52 L
Post FEV1/FVC ratio: 83 %
Post FEV6/FVC ratio: 100 %
Pre FEV1/FVC ratio: 82 %
Pre FEV6/FVC Ratio: 100 %
RV % pred: 88 %
RV: 1.8 L
TLC % pred: 88 %
TLC: 4.5 L

## 2024-07-13 NOTE — Progress Notes (Signed)
 Full PFT performed today.

## 2024-07-13 NOTE — Patient Instructions (Signed)
 Full PFT performed today.

## 2024-08-01 ENCOUNTER — Ambulatory Visit: Payer: Self-pay | Admitting: Pulmonary Disease

## 2024-09-01 NOTE — Telephone Encounter (Signed)
 Received PA renewal form from [facility], however pt has never initiated therapy. Form sent to scan center for retention in case pt becomes interested in the near future.

## 2024-09-27 ENCOUNTER — Ambulatory Visit: Admitting: Pulmonary Disease

## 2024-11-18 ENCOUNTER — Encounter: Payer: Self-pay | Admitting: Pulmonary Disease

## 2024-11-18 ENCOUNTER — Ambulatory Visit: Admitting: Pulmonary Disease

## 2024-11-18 DIAGNOSIS — J4540 Moderate persistent asthma, uncomplicated: Secondary | ICD-10-CM

## 2024-11-18 MED ORDER — MONTELUKAST SODIUM 10 MG PO TABS: 10.0000 mg | ORAL_TABLET | Freq: Every day | ORAL | 3 refills | Status: AC

## 2024-11-18 MED ORDER — ALBUTEROL SULFATE HFA 108 (90 BASE) MCG/ACT IN AERS: 1.0000 | INHALATION_SPRAY | Freq: Four times a day (QID) | RESPIRATORY_TRACT | 11 refills | Status: AC | PRN

## 2024-11-18 MED ORDER — BUDESONIDE-FORMOTEROL FUMARATE 160-4.5 MCG/ACT IN AERO: 2.0000 | INHALATION_SPRAY | Freq: Two times a day (BID) | RESPIRATORY_TRACT | 3 refills | Status: AC

## 2024-11-18 NOTE — Progress Notes (Signed)
 Established Patient Pulmonology Office Visit   Subjective:  Patient ID: Cynthia Ford, female    DOB: 1961-08-20  MRN: [id number]  CC:  Chief Complaint  Patient presents with   Asthma    Follow up overdue/pft done in july    Discussed the use of AI scribe software for clinical note transcription with the patient, who gave verbal consent to proceed.  History of Present Illness Cynthia Ford is a 63 year old female with asthma who presents for a follow-up on her respiratory health.  Her breathing remains stable without significant issues. She experiences occasional leg tightness during walking but no chest tightness. She has occasional coughing with minimal mucus production. Sinus congestion or drainage occurs when working outside, likely due to seasonal allergens.  She uses Breyna  (budesonide /formoterol ) two puffs twice daily and takes Singulair  (montelukast ) at night, though she sometimes forgets. She also uses Flonase nasal spray. She has not used her albuterol  rescue inhaler this year and does not notice wheezing, possibly due to not wearing hearing aids regularly. Her recent breathing test was reported as good. Her eosinophil count was 400, and her IGE level was 161 in March.  She is concerned about developing COPD, as her mother had it severely, although her mother was a smoker and she has never smoked. 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 She retired in 2020, potentially reducing exposure to harmful substances.        ROS    Current Outpatient Medications:    CALCIUM  PO, Take 600 mg by mouth daily. , Disp: , Rfl:    cetirizine (ZYRTEC) 10 MG tablet, Take 10 mg by mouth daily., Disp: , Rfl:    cholecalciferol  (VITAMIN D ) 1000 units tablet, Take 2,000 Units by mouth daily., Disp: , Rfl:    ciprofloxacin-dexamethasone  (CIPRODEX) OTIC suspension, 4 drops as needed., Disp: , Rfl:    fluticasone  (FLONASE) 50 MCG/ACT nasal  spray, Place 2 sprays into both nostrils every morning., Disp: , Rfl:    ibuprofen  (ADVIL ,MOTRIN ) 800 MG tablet, Take 1 tablet (800 mg total) by mouth every 8 (eight) hours as needed., Disp: 30 tablet, Rfl: 0   albuterol  (VENTOLIN  HFA) 108 (90 Base) MCG/ACT inhaler, Inhale 1-2 puffs into the lungs every 6 (six) hours as needed for wheezing or shortness of breath., Disp: 8 g, Rfl: 11   budesonide -formoterol  (BREYNA ) 160-4.5 MCG/ACT inhaler, Inhale 2 puffs into the lungs 2 (two) times daily., Disp: 3 each, Rfl: 3   montelukast  (SINGULAIR ) 10 MG tablet, Take 1 tablet (10 mg total) by mouth at bedtime., Disp: 90 tablet, Rfl: 3      Objective:  BP 118/72   Pulse 62   Temp 97.9 F (36.6 C) (Oral)   Ht 5' 4 (1.626 m)   Wt 148 lb 9.6 oz (67.4 kg)   SpO2 97%   BMI 25.51 kg/m     Physical Exam Constitutional:      General: She is not in acute distress.    Appearance: Normal appearance.  Eyes:     General: No scleral icterus.    Conjunctiva/sclera: Conjunctivae normal.  Cardiovascular:     Rate and Rhythm: Normal rate and regular rhythm.  Pulmonary:     Breath sounds: No wheezing, rhonchi or rales.  Musculoskeletal:     Right lower leg: No edema.     Left lower leg: No edema.  Skin:    General: Skin is warm and dry.  Neurological:     General: No focal deficit present.      Diagnostic Review:       Assessment & Plan:   Assessment & Plan Moderate persistent asthma without complication  Orders:   budesonide -formoterol  (BREYNA ) 160-4.5 MCG/ACT inhaler; Inhale 2 puffs into the lungs 2 (two) times daily.   montelukast  (SINGULAIR ) 10 MG tablet; Take 1 tablet (10 mg total) by mouth at bedtime.   albuterol  (VENTOLIN  HFA) 108 (90 Base) MCG/ACT inhaler; Inhale 1-2 puffs into the lungs every 6 (six) hours as needed for wheezing or shortness of breath.   Assessment and Plan Assessment & Plan Moderate persistent asthma Asthma well-controlled, no recent exacerbations. Occasional  cough and sinus congestion likely allergen-related. Normal lung function, decreased eosinophil count. No wheezing.  - Continue Symbicort  two puffs twice daily. - Continue montelukast  at night. - Continue Flonase nasal spray. - Refilled albuterol  rescue inhaler. - Scheduled follow-up in one year, earlier if symptoms change.      Return in about 1 year (around 11/18/2025) for f/u visit Dr. Kara.   Dorn KATHEE Kara, MD

## 2024-11-18 NOTE — Patient Instructions (Signed)
 Continue with Breyna  inhaler 2 puffs twice daily - rinse mouth out after each use  Continue albuterol  inhaler as needed  Continue montelukast  daily  Continue flonase daily  Follow up in 1 year, call sooner if needed

## 2025-01-20 ENCOUNTER — Other Ambulatory Visit: Payer: Self-pay | Admitting: Obstetrics and Gynecology

## 2025-01-20 DIAGNOSIS — Z1231 Encounter for screening mammogram for malignant neoplasm of breast: Secondary | ICD-10-CM

## 2025-02-18 ENCOUNTER — Ambulatory Visit
